# Patient Record
Sex: Male | Born: 1945 | Race: White | Hispanic: No | Marital: Married
Health system: Midwestern US, Community
[De-identification: ages and names within clinical notes are randomized; demographics above are authoritative.]

## PROBLEM LIST (undated history)

## (undated) DIAGNOSIS — K219 Gastro-esophageal reflux disease without esophagitis: Secondary | ICD-10-CM

## (undated) DIAGNOSIS — R3914 Feeling of incomplete bladder emptying: Principal | ICD-10-CM

## (undated) DIAGNOSIS — I10 Essential (primary) hypertension: Principal | ICD-10-CM

## (undated) DIAGNOSIS — R9439 Abnormal result of other cardiovascular function study: Principal | ICD-10-CM

## (undated) DIAGNOSIS — F0670 Mild neurocognitive disorder due to known physiological condition without behavioral disturbance: Principal | ICD-10-CM

## (undated) DIAGNOSIS — E1165 Type 2 diabetes mellitus with hyperglycemia: Principal | ICD-10-CM

## (undated) DIAGNOSIS — M545 Low back pain, unspecified: Principal | ICD-10-CM

## (undated) DIAGNOSIS — E538 Deficiency of other specified B group vitamins: Principal | ICD-10-CM

## (undated) DIAGNOSIS — F320 Major depressive disorder, single episode, mild: Secondary | ICD-10-CM

## (undated) DIAGNOSIS — Z23 Encounter for immunization: Principal | ICD-10-CM

## (undated) DIAGNOSIS — F331 Major depressive disorder, recurrent, moderate: Principal | ICD-10-CM

## (undated) DIAGNOSIS — G8929 Other chronic pain: Secondary | ICD-10-CM

## (undated) DIAGNOSIS — I493 Ventricular premature depolarization: Principal | ICD-10-CM

## (undated) DIAGNOSIS — G4700 Insomnia, unspecified: Principal | ICD-10-CM

## (undated) DIAGNOSIS — R413 Other amnesia: Secondary | ICD-10-CM

## (undated) DIAGNOSIS — D509 Iron deficiency anemia, unspecified: Secondary | ICD-10-CM

## (undated) DIAGNOSIS — M47819 Spondylosis without myelopathy or radiculopathy, site unspecified: Secondary | ICD-10-CM

## (undated) DIAGNOSIS — R0981 Nasal congestion: Secondary | ICD-10-CM

## (undated) DIAGNOSIS — N401 Enlarged prostate with lower urinary tract symptoms: Secondary | ICD-10-CM

## (undated) DIAGNOSIS — R1310 Dysphagia, unspecified: Secondary | ICD-10-CM

## (undated) DIAGNOSIS — M1710 Unilateral primary osteoarthritis, unspecified knee: Secondary | ICD-10-CM

## (undated) DIAGNOSIS — R0602 Shortness of breath: Secondary | ICD-10-CM

## (undated) DIAGNOSIS — F329 Major depressive disorder, single episode, unspecified: Secondary | ICD-10-CM

## (undated) DIAGNOSIS — K449 Diaphragmatic hernia without obstruction or gangrene: Secondary | ICD-10-CM

## (undated) DIAGNOSIS — M722 Plantar fascial fibromatosis: Secondary | ICD-10-CM

## (undated) DIAGNOSIS — M179 Osteoarthritis of knee, unspecified: Secondary | ICD-10-CM

## (undated) DIAGNOSIS — F32A Depression, unspecified: Secondary | ICD-10-CM

## (undated) DIAGNOSIS — R252 Cramp and spasm: Secondary | ICD-10-CM

## (undated) DIAGNOSIS — M1990 Unspecified osteoarthritis, unspecified site: Secondary | ICD-10-CM

## (undated) DIAGNOSIS — F419 Anxiety disorder, unspecified: Secondary | ICD-10-CM

## (undated) HISTORY — DX: Unspecified osteoarthritis, unspecified site: M19.90

## (undated) HISTORY — DX: Shortness of breath: R06.02

## (undated) HISTORY — DX: Diaphragmatic hernia without obstruction or gangrene: K44.9

## (undated) HISTORY — PX: UPPER GI ENDOSCOPY: SHX6162

## (undated) HISTORY — PX: COLONOSCOPY: SHX174

---

## 2006-04-04 HISTORY — PX: SHOULDER SURGERY: SHX246

## 2010-09-03 HISTORY — PX: VENTRAL HERNIA REPAIR: SHX424

## 2010-09-30 DIAGNOSIS — K436 Other and unspecified ventral hernia with obstruction, without gangrene: Secondary | ICD-10-CM

## 2010-10-05 ENCOUNTER — Telehealth (INDEPENDENT_AMBULATORY_CARE_PROVIDER_SITE_OTHER): Payer: Self-pay

## 2010-10-11 ENCOUNTER — Encounter (INDEPENDENT_AMBULATORY_CARE_PROVIDER_SITE_OTHER): Payer: BC Managed Care – PPO | Admitting: Surgery

## 2010-10-12 ENCOUNTER — Encounter (INDEPENDENT_AMBULATORY_CARE_PROVIDER_SITE_OTHER): Payer: BC Managed Care – PPO | Admitting: Surgery

## 2010-10-12 ENCOUNTER — Ambulatory Visit (INDEPENDENT_AMBULATORY_CARE_PROVIDER_SITE_OTHER): Payer: BC Managed Care – PPO | Admitting: Surgery

## 2010-10-12 ENCOUNTER — Encounter (INDEPENDENT_AMBULATORY_CARE_PROVIDER_SITE_OTHER): Payer: Self-pay | Admitting: Surgery

## 2010-10-12 DIAGNOSIS — K439 Ventral hernia without obstruction or gangrene: Secondary | ICD-10-CM

## 2010-10-12 NOTE — Progress Notes (Signed)
HISTORY: Patient is a 65 year old white male who returns for postoperative wound check after undergoing repair of ventral hernia with mesh on September 30, 2010.   PERTINENT REVIEW OF SYSTEMS: Mild discomfort. No drainage.   EXAM: Wound is healing nicely in the midline. Steri-Strips are removed in the office today. No sign of seroma no sign of infection with Valsalva there is no sign of recurrence.   IMPRESSION: Status post repair of ventral hernia with mesh, no evident complications   PLAN: Patient will begin applying topical creams to his incision. He is restricted to 25 pounds lifting. He will return to see me in 6 weeks for final wound check.

## 2010-11-30 ENCOUNTER — Encounter (INDEPENDENT_AMBULATORY_CARE_PROVIDER_SITE_OTHER): Payer: BC Managed Care – PPO | Admitting: Surgery

## 2010-12-13 ENCOUNTER — Encounter (INDEPENDENT_AMBULATORY_CARE_PROVIDER_SITE_OTHER): Payer: Self-pay | Admitting: Surgery

## 2010-12-13 ENCOUNTER — Ambulatory Visit (INDEPENDENT_AMBULATORY_CARE_PROVIDER_SITE_OTHER): Payer: BC Managed Care – PPO | Admitting: Surgery

## 2010-12-13 DIAGNOSIS — K439 Ventral hernia without obstruction or gangrene: Secondary | ICD-10-CM

## 2010-12-13 NOTE — Patient Instructions (Signed)
Released to full activity without restriction.  May cut the grass!  TMG

## 2010-12-13 NOTE — Progress Notes (Signed)
Visit Diagnoses: 1. Ventral hernia without mention of obstruction or gangrene     HISTORY: Patient returns for followup having undergone ventral hernia repair with mesh. He has no complaints.   EXAM: Midline incision is well healed above the level of the umbilicus. With Valsalva, there is no sign of recurrence.   IMPRESSION: Symptomatic ventral hernia, resolved after repair with mesh   PLAN: Patient is released to full activity without restrictions. He will return as needed.   Velora Heckler, MD, FACS General & Endocrine Surgery [facility]

## 2011-09-22 ENCOUNTER — Encounter (INDEPENDENT_AMBULATORY_CARE_PROVIDER_SITE_OTHER): Payer: Self-pay | Admitting: Surgery

## 2011-09-22 ENCOUNTER — Ambulatory Visit (INDEPENDENT_AMBULATORY_CARE_PROVIDER_SITE_OTHER): Payer: BC Managed Care – PPO | Admitting: Surgery

## 2011-09-22 VITALS — BP 130/82 | HR 71 | Temp 97.0°F | Resp 18 | Ht 71.0 in | Wt 215.4 lb

## 2011-09-22 DIAGNOSIS — M6208 Separation of muscle (nontraumatic), other site: Secondary | ICD-10-CM | POA: Insufficient documentation

## 2011-09-22 DIAGNOSIS — M6200 Separation of muscle (nontraumatic), unspecified site: Secondary | ICD-10-CM

## 2011-09-22 NOTE — Patient Instructions (Signed)
Abdominal binder for support  Ice pack, heating pad for comfort

## 2011-09-22 NOTE — Progress Notes (Signed)
General Surgery [facility]  Visit Diagnoses: 1. Rectus diastasis     HISTORY: Patient is a 66 year old white male who underwent repair of ventral hernia with mesh in June 2012. Patient has developed some discomfort, particularly late in the day and in the evening, around the area of the surgical site. He has not seen a bulge. He has had no signs or symptoms of obstruction. The pain is better if he wears a binder or abdominal support. He presents today for evaluation.  PERTINENT REVIEW OF SYSTEMS: No signs or symptoms of intestinal obstruction. No visible bulge.  EXAM: HEENT: normocephalic; pupils equal and reactive; sclerae clear; dentition good; mucous membranes moist NECK:  symmetric on extension; no palpable anterior or posterior cervical lymphadenopathy; no supraclavicular masses; no tenderness ABDOMEN: Soft without distention; surgical wound is well healed; with sit-up maneuver there is a moderate rectus diastases in the upper midline; no palpable fascial defect. Slight laxity in the abdominal wall just above the level of the umbilicusI EXT:  non-tender without edema; no deformity NEURO: no gross focal deficits; no sign of tremor   IMPRESSION: #1 moderate rectus diastasis #2 history of ventral hernia repair with mesh, no evidence of recurrence  PLAN: The patient and his wife and I discussed the above findings. At this point there is no role for surgical intervention. I have asked him not to restrict his activities. There is approximately a 5-10% chance of recurrent hernia. If this were to occur, I believe he would be a good candidate for laparoscopic repair.  Patient will return in 6 months for evaluation. In the interim he will use an abdominal binder or support with physical activity. He may use nonsteroidal anti-inflammatory medications for symptomatic relief.  Velora Heckler, MD, FACS General & Endocrine Surgery [facility]

## 2012-09-27 ENCOUNTER — Ambulatory Visit: Payer: Self-pay | Admitting: *Deleted

## 2012-12-31 ENCOUNTER — Telehealth (INDEPENDENT_AMBULATORY_CARE_PROVIDER_SITE_OTHER): Payer: Self-pay

## 2012-12-31 NOTE — Telephone Encounter (Signed)
Pt called stating he has notice soreness at times above his hernia repair. No bulge that does not reduce. No redness or fever. Pt offered ov. Pt declined and states he will see if symptoms resolves and if not he will call back for appt.

## 2013-04-04 HISTORY — PX: TOTAL KNEE ARTHROPLASTY: SHX125

## 2014-08-05 ENCOUNTER — Other Ambulatory Visit (HOSPITAL_COMMUNITY): Payer: Self-pay | Admitting: Orthopedic Surgery

## 2014-08-05 DIAGNOSIS — M25561 Pain in right knee: Secondary | ICD-10-CM

## 2014-08-14 ENCOUNTER — Encounter (HOSPITAL_COMMUNITY): Payer: Medicare Other

## 2014-08-14 ENCOUNTER — Encounter (HOSPITAL_COMMUNITY)
Admission: RE | Admit: 2014-08-14 | Discharge: 2014-08-14 | Disposition: A | Discharge status: Home / Self Care | Payer: Medicare Other | Source: Ambulatory Visit | Attending: Orthopedic Surgery | Admitting: Orthopedic Surgery

## 2014-08-14 DIAGNOSIS — R937 Abnormal findings on diagnostic imaging of other parts of musculoskeletal system: Secondary | ICD-10-CM | POA: Diagnosis not present

## 2014-08-14 DIAGNOSIS — Z96651 Presence of right artificial knee joint: Secondary | ICD-10-CM | POA: Diagnosis not present

## 2014-08-14 DIAGNOSIS — M25561 Pain in right knee: Secondary | ICD-10-CM | POA: Diagnosis not present

## 2014-08-14 MED ORDER — TECHNETIUM TC 99M MEDRONATE IV KIT
25.0000 | PACK | Freq: Once | INTRAVENOUS | Status: AC | PRN
Start: 2014-08-14 — End: 2014-08-14
  Administered 2014-08-14: 25 via INTRAVENOUS

## 2015-01-26 ENCOUNTER — Encounter: Payer: Self-pay | Admitting: Podiatry

## 2015-01-26 ENCOUNTER — Ambulatory Visit (INDEPENDENT_AMBULATORY_CARE_PROVIDER_SITE_OTHER): Payer: Medicare Other | Admitting: Podiatry

## 2015-01-26 VITALS — BP 159/81 | HR 76 | Ht 71.0 in | Wt 230.0 lb

## 2015-01-26 DIAGNOSIS — M216X9 Other acquired deformities of unspecified foot: Secondary | ICD-10-CM | POA: Insufficient documentation

## 2015-01-26 DIAGNOSIS — M722 Plantar fascial fibromatosis: Secondary | ICD-10-CM | POA: Diagnosis not present

## 2015-01-26 DIAGNOSIS — M216X1 Other acquired deformities of right foot: Secondary | ICD-10-CM

## 2015-01-26 NOTE — Progress Notes (Signed)
SUBJECTIVE: 69 y.o. year old male presents for pain in right heel x 2 months.  Seen other doctor and got 2 injections. Pain was in mid strip plantar fascia and now it is on the center heel. Pain is mostly on the plantar heel after been on for 10 hours on feet.  Pain also comes on around ankle. He has Night Splint but has not been using it.   REVIEW OF SYSTEMS: A comprehensive review of systems was negative except for: right knee surgery 2 years ago.   OBJECTIVE: DERMATOLOGIC EXAMINATION: No abnormal findings.  VASCULAR EXAMINATION OF LOWER LIMBS: Pedal pulses: All pedal pulses are palpable with normal pulsation.  NEUROLOGIC EXAMINATION OF THE LOWER LIMBS: All epicritic and tactile sensations grossly intact.  MUSCULOSKELETAL EXAMINATION: Positive for tight Achilles tendon right. Cavus type foot bilateral. Excess sagittal plane motion of the first ray left.  Excess transverse plane motion of forefoot bilateral.  RADIOGRAPHIC STUDIES:  Findings reveal high arched calcaneus bilateral, supinated Subtalar joints bilateral, elevated first ray left without acute changes in forefoot or rearfoot bilateral.   ASSESSMENT: Ankle Equinus right. Plantar fasciitis right.  Compensated rearfoot varus bilateral.  PLAN: Reviewed clinical findings and available treatment options; NSAIA, Injection, Orthotics, Exercise, Night Splint. Reviewed Achilles tendon stretch exercise, benefit of using Night Splint, NSAIA, Cortisone injection, and Orthotics.  Patient will return for orthotics.

## 2015-01-26 NOTE — Patient Instructions (Signed)
Seen for right heel pain. Noted of tight Achilles tendon right. Reviewed stretch exercise. Need orthotics. We will contact with insurance benefit info.

## 2016-06-03 IMAGING — NM NM BONE 3 PHASE
10 series · 20 of 20 positions shown · non-contrast
Comparison: None.

CLINICAL DATA: Evaluate for loosening or infection of right knee
prosthesis

EXAM:
NUCLEAR MEDICINE 3-PHASE BONE SCAN
TECHNIQUE: Radionuclide angiographic images, immediate static blood pool
images, and 3-hour delayed static images were obtained of the knees
after intravenous injection of radiopharmaceutical.
RADIOPHARMACEUTICALS:  25.0 Mechnetium-NNm MDP IV

[Series 1: flow · 2.07mm/px · 6 of 48 frames shown (1 of 2)]
[frame 5/48  full-range]
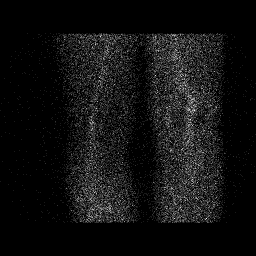
[frame 13/48  full-range]
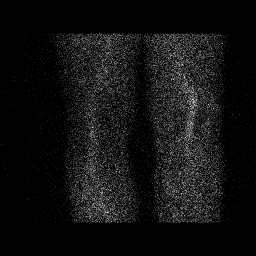
[frame 21/48  full-range]
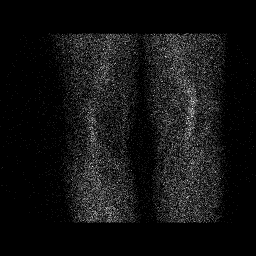
[frame 29/48  full-range]
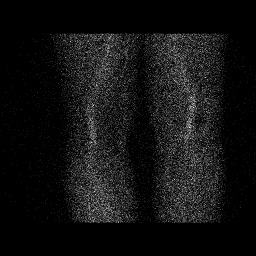
[frame 37/48  full-range]
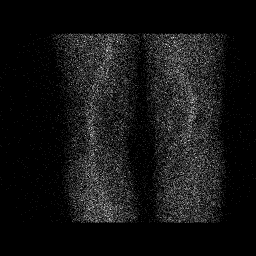
[frame 45/48  full-range]
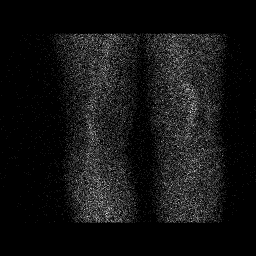

[Series 1: flow · 2.07mm/px · 6 of 48 frames shown (2 of 2)]
[frame 5/48  full-range]
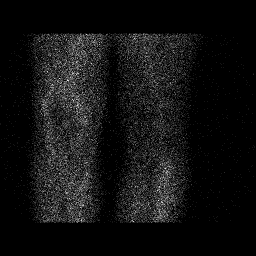
[frame 13/48  full-range]
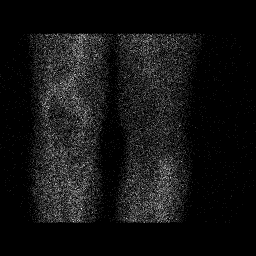
[frame 21/48  full-range]
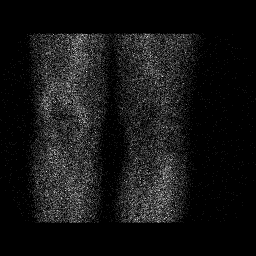
[frame 29/48  full-range]
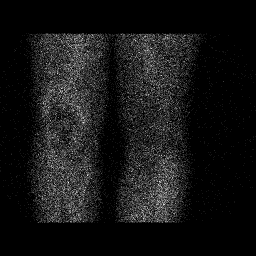
[frame 37/48  full-range]
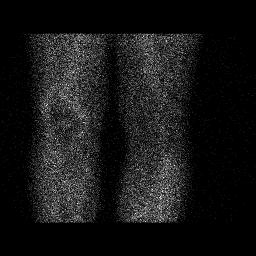
[frame 45/48  full-range]
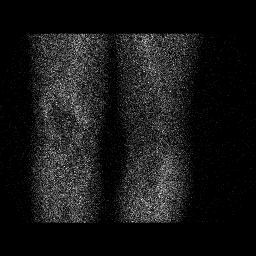

[Series 2: wbr_bone_60 blood pool · 2.07mm/px · 1 of 1 slices shown (1 of 6)]
[im 1/1  full-range]
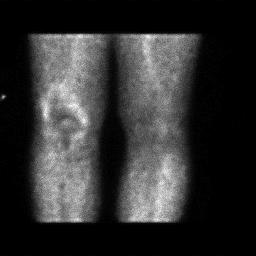

[Series 2: wbr_bone_60 blood pool · 2.07mm/px · 1 of 1 slices shown (2 of 6)]
[im 1/1  full-range]
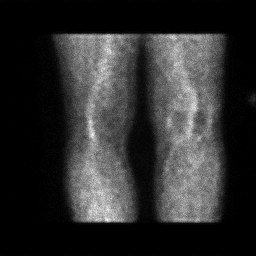

[Series 3: wbr_bone_60 lat bp · 2.07mm/px · 1 of 1 slices shown (1 of 2)]
[im 1/1  full-range]
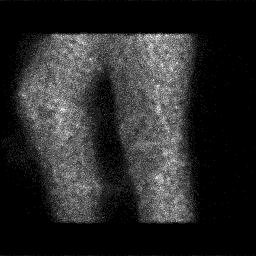

[Series 3: wbr_bone_60 lat bp · 2.07mm/px · 1 of 1 slices shown (2 of 2)]
[im 1/1  full-range]
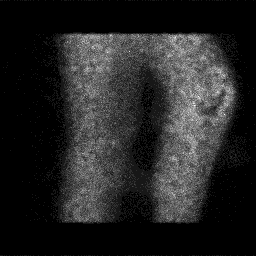

[Series 5: wbr_bone_60 blood pool · 2.07mm/px · 1 of 1 slices shown (3 of 6)]
[im 1/1  full-range]
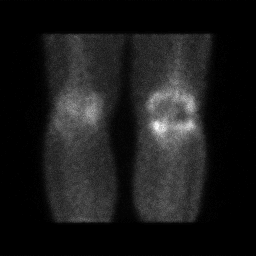

[Series 5: wbr_bone_60 blood pool · 2.07mm/px · 1 of 1 slices shown (4 of 6)]
[im 1/1]
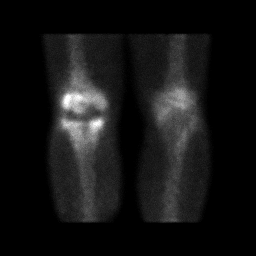

[Series 6: wbr_bone_60 blood pool · 2.07mm/px · 1 of 1 slices shown (5 of 6)]
[im 1/1]
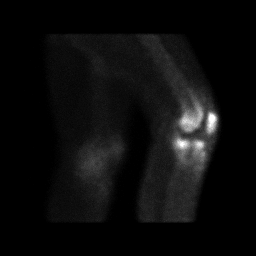

[Series 6: wbr_bone_60 blood pool · 2.07mm/px · 1 of 1 slices shown (6 of 6)]
[im 1/1]
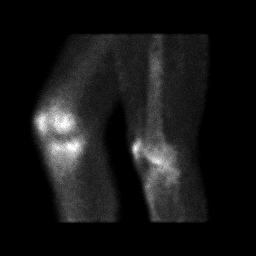

[20 of 20 positions shown; findings below may reference images not displayed]

FINDINGS: Vascular phase: On the flow phase portion of the examination there
is a mild asymmetric increased blood flow surrounding the right knee
arthroplasty device.

Blood pool phase: Asymmetric increased blood pool activity surrounds
the right knee arthroplasty device.

Delayed phase: There is moderate asymmetric increased uptake
surrounding the femoral and tibial components of the right knee
arthroplasty device.
IMPRESSION: Findings are compatible with either arthroplasty device loosening or
infection.

## 2016-11-30 ENCOUNTER — Other Ambulatory Visit: Payer: Medicare Other

## 2017-02-09 ENCOUNTER — Other Ambulatory Visit: Payer: Self-pay

## 2017-02-09 ENCOUNTER — Encounter (HOSPITAL_COMMUNITY): Payer: Self-pay

## 2017-02-09 ENCOUNTER — Emergency Department (HOSPITAL_COMMUNITY)
Admission: EM | Admit: 2017-02-09 | Discharge: 2017-02-09 | Disposition: A | Discharge status: Home / Self Care | Payer: Medicare Other | Attending: Emergency Medicine | Admitting: Emergency Medicine

## 2017-02-09 DIAGNOSIS — S6992XA Unspecified injury of left wrist, hand and finger(s), initial encounter: Secondary | ICD-10-CM | POA: Diagnosis present

## 2017-02-09 DIAGNOSIS — Z23 Encounter for immunization: Secondary | ICD-10-CM | POA: Diagnosis not present

## 2017-02-09 DIAGNOSIS — Z87891 Personal history of nicotine dependence: Secondary | ICD-10-CM | POA: Diagnosis not present

## 2017-02-09 DIAGNOSIS — Z79899 Other long term (current) drug therapy: Secondary | ICD-10-CM | POA: Diagnosis not present

## 2017-02-09 DIAGNOSIS — Y929 Unspecified place or not applicable: Secondary | ICD-10-CM | POA: Diagnosis not present

## 2017-02-09 DIAGNOSIS — Z7982 Long term (current) use of aspirin: Secondary | ICD-10-CM | POA: Diagnosis not present

## 2017-02-09 DIAGNOSIS — Y999 Unspecified external cause status: Secondary | ICD-10-CM | POA: Insufficient documentation

## 2017-02-09 DIAGNOSIS — Y9389 Activity, other specified: Secondary | ICD-10-CM | POA: Diagnosis not present

## 2017-02-09 DIAGNOSIS — S61412A Laceration without foreign body of left hand, initial encounter: Secondary | ICD-10-CM | POA: Diagnosis not present

## 2017-02-09 DIAGNOSIS — W298XXA Contact with other powered powered hand tools and household machinery, initial encounter: Secondary | ICD-10-CM | POA: Insufficient documentation

## 2017-02-09 MED ORDER — LIDOCAINE HCL 2 % IJ SOLN
20.0000 mL | Freq: Once | INTRAMUSCULAR | Status: AC
  Administered 2017-02-09: 400 mg
  Filled 2017-02-09: qty 20

## 2017-02-09 MED ORDER — CEPHALEXIN 500 MG PO CAPS: 500.0000 mg | ORAL_CAPSULE | Freq: Two times a day (BID) | ORAL | 0 refills | Status: DC

## 2017-02-09 MED ORDER — CEPHALEXIN 250 MG PO CAPS
500.0000 mg | ORAL_CAPSULE | Freq: Once | ORAL | Status: AC
  Administered 2017-02-09: 500 mg via ORAL
  Filled 2017-02-09: qty 2

## 2017-02-09 MED ORDER — TETANUS-DIPHTH-ACELL PERTUSSIS 5-2.5-18.5 LF-MCG/0.5 IM SUSP
0.5000 mL | Freq: Once | INTRAMUSCULAR | Status: AC
  Administered 2017-02-09: 0.5 mL via INTRAMUSCULAR
  Filled 2017-02-09: qty 0.5

## 2017-02-09 NOTE — ED Notes (Signed)
States a piece of metal got caught on his drill and it cut his left  hand  palm of hand at the base of his thumb.

## 2017-02-09 NOTE — ED Provider Notes (Signed)
[facility] Provider Note   CSN: [id number] Arrival date & time: 02/09/17  1329     History   Chief Complaint No chief complaint on file.   HPI Tommy Bailey is a 71 y.o. male.  HPI    71 year old male presents today with a laceration to his left hand.  Patient reports that he was using a saw when he caused a laceration to the palmar aspect of the hand.  Bleeding controlled with direct pressure, tetanus status unknown.  Patient denies any loss of sensation strength and motor function of the fingers.  She takes 81 mg of aspirin daily, no anticoagulation.  Patient is right-hand dominant.    Past Medical History:  Diagnosis Date  . Arthritis   . Hiatal hernia   . Muscle ache   . SOB (shortness of breath)     Patient Active Problem List   Diagnosis Date Noted  . Plantar fasciitis of right foot 01/26/2015  . Equinus deformity of foot, acquired 01/26/2015  . Rectus diastasis 09/22/2011    Past Surgical History:  Procedure Laterality Date  . SHOULDER SURGERY  2008   both       Home Medications    Prior to Admission medications   Medication Sig Start Date End Date Taking? Authorizing Provider  aspirin 81 MG tablet Take 81 mg by mouth daily.      [provider]  cephALEXin (KEFLEX) 500 MG capsule Take 1 capsule (500 mg total) 2 (two) times daily by mouth. 02/09/17   Enes Rokosz, Tinnie GensJeffrey, PA-C  fish oil-omega-3 fatty acids 1000 MG capsule Take 2 g by mouth daily.      [provider]  GLUCOSAMINE-VITAMIN D PO Take 2,000 mg by mouth.      [provider]  Multiple Vitamin (MULTIVITAMIN) tablet Take 1 tablet by mouth daily.      [provider]  omeprazole (PRILOSEC) 40 MG capsule Take 20 mg by mouth daily.     [provider]    Family History Family History  Problem Relation Age of Onset  . Cancer Mother        leukemia  . Heart disease Father     Social History Social History    Tobacco Use  . Smoking status: Former Smoker    Last attempt to quit: 01/03/2007    Years since quitting: 10.1  . Smokeless tobacco: Never Used  Substance Use Topics  . Alcohol use: No    Alcohol/week: 0.0 oz    Comment: occasional  . Drug use: No     Allergies   Anesthetics, amide and Tramadol   Review of Systems Review of Systems  All other systems reviewed and are negative.  Physical Exam Updated Vital Signs BP (!) 138/99 (BP Location: Right Arm)   Pulse 79   Temp 98.4 F (36.9 C) (Oral)   Resp 16   SpO2 96%   Physical Exam  Constitutional: He is oriented to person, place, and time. He appears well-developed and well-nourished.  HENT:  Head: Normocephalic and atraumatic.  Eyes: Conjunctivae are normal. Pupils are equal, round, and reactive to light. Right eye exhibits no discharge. Left eye exhibits no discharge. No scleral icterus.  Neck: Normal range of motion. No JVD present. No tracheal deviation present.  Pulmonary/Chest: Effort normal. No stridor.  Musculoskeletal:  5 cm laceration to the palmar aspect of the left hand in between the second and first digit, no deep space involvement, no tendon,  muscle, major vessel or nerve involved full active range of motion of the finger and hand sensation intact  Neurological: He is alert and oriented to person, place, and time. Coordination normal.  Psychiatric: He has a normal mood and affect. His behavior is normal. Judgment and thought content normal.  Nursing note and vitals reviewed.    ED Treatments / Results  Labs (all labs ordered are listed, but only abnormal results are displayed) Labs Reviewed - No data to display  EKG  EKG Interpretation None       Radiology No results found.  Procedures Procedures (including critical care time)  Medications Ordered in ED Medications  lidocaine (XYLOCAINE) 2 % (with pres) injection 400 mg (400 mg Infiltration Given 02/09/17 1608)  Tdap (BOOSTRIX) injection  0.5 mL (0.5 mLs Intramuscular Given 02/09/17 1608)  cephALEXin (KEFLEX) capsule 500 mg (500 mg Oral Given 02/09/17 1838)     Initial Impression / Assessment and Plan / ED Course  I have reviewed the triage vital signs and the nursing notes.  Pertinent labs & imaging results that were available during my care of the patient were reviewed by me and considered in my medical decision making (see chart for details).      Final Clinical Impressions(s) / ED Diagnoses   Final diagnoses:  Laceration of left hand without foreign body, initial encounter    Labs:   Imaging:  Consults:  Therapeutics: Lidocaine, Tdap, Keflex  Discharge Meds: keflex  Assessment/Plan: 71 year old male presents today with laceration to his left hand.   Dr. Ottie GlazierGunadasa and I repaired the laceration after copious irrigation and evaluation.  Patient tolerated the procedure well without complication.  Patient's tetanus was updated, he will be started on prophylactic antibiotic for the next 3 days, he will return immediately with any new or worsening signs or symptoms.     ED Discharge Orders        Ordered    cephALEXin (KEFLEX) 500 MG capsule  2 times daily     02/09/17 1826       Eyvonne MechanicHedges, Jaycee Pelzer, PA-C 02/09/17 2045    Pricilla LovelessGoldston, Scott, MD 02/11/17 1946

## 2017-02-09 NOTE — ED Triage Notes (Addendum)
Patient here with laceration to left hand, cut with knife, bleeding controlled. Full ROM, dressing with saline

## 2017-02-09 NOTE — Discharge Instructions (Signed)
Please read attached information. If you experience any new or worsening signs or symptoms please return to the emergency room for evaluation. Please follow-up with your primary care provider or specialist as discussed. Please use medication prescribed only as directed and discontinue taking if you have any concerning signs or symptoms.   °

## 2017-02-09 NOTE — ED Notes (Signed)
Bleeding controled

## 2017-02-09 NOTE — ED Notes (Signed)
Pt verbalized understanding discharge instructions and denies any further needs or questions at this time. VS stable, ambulatory and steady gait.   

## 2017-02-09 NOTE — ED Provider Notes (Signed)
  [facility]  LACERATION REPAIR PROCEDURE NOTE The patient's identification was confirmed and consent was obtained. This procedure was performed by Kandis MannanKanishka Gunadasa at 5:44 PM. Site: Palmar aspect of the left hand  Anesthetic used (type and amt): Lidocaine 2% 7cc Suture type/size: 4-0 Vicryl Rapide  Length: 5cm  # of Sutures: 5 simple interrupted, 2 subcuticular  Antibx ointment applied yes Tetanus UTD or orderedyes Site anesthetized, irrigated with NS, explored without evidence of foreign body, wound well approximated, site covered with dry, sterile dressing.  Patient tolerated procedure well without complications. Instructions for care discussed verbally and patient provided with additional written instructions for homecare and f/u.  Palma HolterKanishka G Gunadasa, MD PGY3 Family Medicine     Palma HolterGunadasa, Kanishka G, MD 02/09/17 [phone]

## 2017-04-26 ENCOUNTER — Encounter (INDEPENDENT_AMBULATORY_CARE_PROVIDER_SITE_OTHER): Payer: Self-pay | Admitting: Orthopaedic Surgery

## 2017-04-26 ENCOUNTER — Ambulatory Visit (INDEPENDENT_AMBULATORY_CARE_PROVIDER_SITE_OTHER): Payer: Medicare Other

## 2017-04-26 ENCOUNTER — Ambulatory Visit (INDEPENDENT_AMBULATORY_CARE_PROVIDER_SITE_OTHER): Payer: Medicare Other | Admitting: Orthopaedic Surgery

## 2017-04-26 ENCOUNTER — Other Ambulatory Visit (INDEPENDENT_AMBULATORY_CARE_PROVIDER_SITE_OTHER): Payer: Self-pay

## 2017-04-26 DIAGNOSIS — G8929 Other chronic pain: Secondary | ICD-10-CM | POA: Diagnosis not present

## 2017-04-26 DIAGNOSIS — Z96651 Presence of right artificial knee joint: Secondary | ICD-10-CM

## 2017-04-26 DIAGNOSIS — M25561 Pain in right knee: Secondary | ICD-10-CM

## 2017-04-26 NOTE — Progress Notes (Signed)
Office Visit Note   Patient: Tommy Bailey           Date of Birth: Dec 13, 1945           MRN: [id number] Visit Date: 04/26/2017              Requested by: Tommy GrippeKim, James, MD [address] [place], [place] [id number] PCP: Tommy GrippeKim, James, MD   Assessment & Plan: Visit Diagnoses:  1. Chronic pain of right knee   2. Presence of right artificial knee joint     Plan: We will obtain a bone scan to rule out loosening/infection of the right total knee.  See him back 2 weeks after the bone scan to go over results.  Aspirate today from the right knee which was a total of 32 cc was sent for cell count.  Questions encouraged and answered at length by Dr. Magnus IvanBlackman and myself.  Follow-Up Instructions: Return for 2 weeks.   Orders:  Orders Placed This Encounter  Procedures  . XR KNEE 3 VIEW RIGHT  . Cell count + diff,  w/ cryst-synvl fld   No orders of the defined types were placed in this encounter.     Procedures: No procedures performed   Clinical Data: No additional findings.   Subjective: Chief Complaint  Patient presents with  . Right Knee - Pain    HPI Tommy Bailey is a 72 year old male was seen for the first time for right knee pain.  He underwent a right total knee arthroplasty by physician at [place] in [place].  He states that he had pain in the knee since the surgery.  He did have to undergo manipulation of the knee.  He states he did well with his range of motion but it was still aching and tight.  Now his range of motion is decreasing.  States if he stands for prolonged period of time knee gets tighter and tighter because of the trip.  Is achy pain in the knee even with sitting.  His pain with change in positions of from sitting to standing.  Stairs are very difficult for him.  States that there is "an elastic bandage around the knee "feeling with the use.  He notes that he has had a area of tenderness medial aspect of the knee that is been there since "day 1".  Pain  does awaken him at night and is mostly along the lateral aspect of the knee.  Does feel that his scars after surgeries in the past and wonders if the scar tissue is what is causing his pain in his knee.  Is seen by orthopedic surgeon here in town in 2016 and had bone scan which showed findings compatible with loosening or infection of the knee then [facility] he was told he did not have any type of loosening possible infection and he states he was given antibiotics is unsure if this really helped.  He denies any recent fevers chills shortness of breath chest pain.  He did have an upper respiratory infection approximately 2 months ago. Review of Systems  Denies any mechanical symptoms of the knee.  Denies any redness or abnormal warmth right knee.  No chest pain shortness breath fevers chills. Objective: Vital Signs: There were no vitals taken for this visit.  Physical Exam  Constitutional: He is oriented to person, place, and time. He appears well-developed and well-nourished. No distress.  Pulmonary/Chest: Effort normal.  Neurological: He is alert  and oriented to person, place, and time.  Skin: He is not diaphoretic.  Psychiatric: He has a normal mood and affect.    Ortho Exam Right knee full extension flexion and 90 degrees.  No instability valgus varus stressing.  Anterior drawer posterior drawer negative.  No abnormal warmth erythema when compared to the left knee.  Slight edema and positive effusion right knee.  Surgical incisions well healed.  Specialty Comments:  No specialty comments available.  Imaging: Xr Knee 3 View Right  Result Date: 04/26/2017 3 views right knee: Well seated right total knee arthroplasty.  No acute fractures.  Knee is well located.  No bony abnormalities.    PMFS History: Patient Active Problem List   Diagnosis Date Noted  . Plantar fasciitis of right foot 01/26/2015  . Equinus deformity of foot, acquired 01/26/2015  . Rectus  diastasis 09/22/2011   Past Medical History:  Diagnosis Date  . Arthritis   . Hiatal hernia   . Muscle ache   . SOB (shortness of breath)     Family History  Problem Relation Age of Onset  . Cancer Mother        leukemia  . Heart disease Father     Past Surgical History:  Procedure Laterality Date  . SHOULDER SURGERY  2008   both   Social History   Occupational History  . Not on file  Tobacco Use  . Smoking status: Former Smoker    Last attempt to quit: 01/03/2007    Years since quitting: 10.3  . Smokeless tobacco: Never Used  Substance and Sexual Activity  . Alcohol use: No    Alcohol/week: 0.0 oz    Comment: occasional  . Drug use: No  . Sexual activity: Not on file

## 2017-04-26 NOTE — Progress Notes (Signed)
3 phase

## 2017-04-27 LAB — SYNOVIAL CELL COUNT + DIFF, W/ CRYSTALS
BASOPHILS, %: 0 %
Eosinophils-Synovial: 2 % (ref 0–2)
Lymphocytes-Synovial Fld: 57 % (ref 0–74)
Monocyte/Macrophage: 31 % (ref 0–69)
Neutrophil, Synovial: 5 % (ref 0–24)
SYNOVIOCYTES, %: 5 % (ref 0–15)
WBC, Synovial: 205 cells/uL — ABNORMAL HIGH (ref <150)

## 2017-04-27 LAB — TIQ-NTM

## 2017-04-28 ENCOUNTER — Telehealth (INDEPENDENT_AMBULATORY_CARE_PROVIDER_SITE_OTHER): Payer: Self-pay | Admitting: Orthopaedic Surgery

## 2017-04-28 NOTE — Telephone Encounter (Signed)
IC patient and LMVM advising that we did receive [facility], however his OP note was not included. Told the patient that the cover letter of the fax stated he needed to contact the facility where he had the surgery for the OP note and asked patient to do that as Dr. Magnus IvanBlackman is still in need of that.

## 2017-05-03 ENCOUNTER — Other Ambulatory Visit (INDEPENDENT_AMBULATORY_CARE_PROVIDER_SITE_OTHER): Payer: Self-pay | Admitting: Orthopaedic Surgery

## 2017-05-03 ENCOUNTER — Telehealth (INDEPENDENT_AMBULATORY_CARE_PROVIDER_SITE_OTHER): Payer: Self-pay | Admitting: *Deleted

## 2017-05-03 NOTE — Telephone Encounter (Signed)
Pt has appt scheduled for Mon Jan 4 at 11 am for injection and then back at 2 pm for the scan. I called and left message on vm to return call for appt information.

## 2017-05-05 NOTE — Telephone Encounter (Signed)
Pt aware fo appt

## 2017-05-08 ENCOUNTER — Encounter (HOSPITAL_COMMUNITY)
Admission: RE | Admit: 2017-05-08 | Discharge: 2017-05-08 | Disposition: A | Discharge status: Home / Self Care | Payer: Medicare Other | Source: Ambulatory Visit | Attending: Orthopaedic Surgery | Admitting: Orthopaedic Surgery

## 2017-05-08 DIAGNOSIS — Z96651 Presence of right artificial knee joint: Secondary | ICD-10-CM | POA: Insufficient documentation

## 2017-05-08 MED ORDER — TECHNETIUM TC 99M MEDRONATE IV KIT
20.2000 | PACK | Freq: Once | INTRAVENOUS | Status: AC | PRN
  Administered 2017-05-08: 20.2 via INTRAVENOUS

## 2017-05-10 ENCOUNTER — Encounter (INDEPENDENT_AMBULATORY_CARE_PROVIDER_SITE_OTHER): Payer: Self-pay | Admitting: Orthopaedic Surgery

## 2017-05-10 ENCOUNTER — Ambulatory Visit (INDEPENDENT_AMBULATORY_CARE_PROVIDER_SITE_OTHER): Payer: Medicare Other | Admitting: Orthopaedic Surgery

## 2017-05-10 DIAGNOSIS — M25561 Pain in right knee: Secondary | ICD-10-CM | POA: Diagnosis not present

## 2017-05-10 DIAGNOSIS — Z96651 Presence of right artificial knee joint: Secondary | ICD-10-CM

## 2017-05-10 DIAGNOSIS — G8929 Other chronic pain: Secondary | ICD-10-CM | POA: Diagnosis not present

## 2017-05-10 NOTE — Progress Notes (Signed)
Patient is a very pleasant 72 year old gentleman I am seeing in follow-up after having a three-phase bone scan of his right knee to rule out prosthetic loosening.  He is someone who had a total knee arthroplasty done elsewhere in 2015.  He developed significant scar tissue postoperatively.  He needed a manipulation under anesthesia.  He seemed another physician as well for second opinion.  At this point his flexion is only to about 70 degrees or so on that knee and it hurts on a daily basis which is more mechanical or not.  The knee also swells as well.  Again this is the right knee.  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  They cannot rule out infection but I told him that the low sensitivity for infection.  We did aspirate his knee and only found just over 200 white blood cells.  I feel this is more indicative of prosthetic loosening and the buildup of scar tissue.  Had a long and thorough discussion with him about revision surgery.  He has been taught his wife and think about this.  I gave him our surgery schedulers card.  This would involve first opening the knee up and removing as much scar tissue on the polyethylene liner and then assessing the components for loosening and revising as appropriate.  We had a long and thorough discussion of the risk minutes of this type of surgery and what his intraoperative and postoperative course involved.  I went over his x-rays again as well as his plain films.  All questions concerns were answered and addressed.  He will let us know.

## 2017-05-12 ENCOUNTER — Telehealth (INDEPENDENT_AMBULATORY_CARE_PROVIDER_SITE_OTHER): Payer: Self-pay

## 2017-05-12 NOTE — Telephone Encounter (Signed)
Patient wants to schedule surgery.  Please write surgery sheet for me.  Thanks!

## 2017-05-15 ENCOUNTER — Encounter (INDEPENDENT_AMBULATORY_CARE_PROVIDER_SITE_OTHER): Payer: Self-pay | Admitting: Orthopaedic Surgery

## 2017-05-15 DIAGNOSIS — Z96659 Presence of unspecified artificial knee joint: Secondary | ICD-10-CM

## 2017-05-15 DIAGNOSIS — T84038A Mechanical loosening of other internal prosthetic joint, initial encounter: Secondary | ICD-10-CM | POA: Insufficient documentation

## 2017-05-17 ENCOUNTER — Telehealth (INDEPENDENT_AMBULATORY_CARE_PROVIDER_SITE_OTHER): Payer: Self-pay | Admitting: Orthopaedic Surgery

## 2017-05-17 NOTE — Telephone Encounter (Signed)
I called patient and scheduled surgery. 

## 2017-05-17 NOTE — Telephone Encounter (Signed)
Patient stated that he has left several message and just wanted to make sure that you were receiving them.  CB#[phone].  Thank you.

## 2017-06-02 NOTE — Progress Notes (Signed)
Please place orders in Epic as patient is being scheduled for a pre-op appointment! Thank you! 

## 2017-06-05 NOTE — Progress Notes (Signed)
Please place orders in Epic as patient has a pre-op appointment on 06/08/2017 at 0800 am ! Thank you!

## 2017-06-06 ENCOUNTER — Encounter (HOSPITAL_COMMUNITY): Payer: Self-pay

## 2017-06-07 ENCOUNTER — Other Ambulatory Visit (INDEPENDENT_AMBULATORY_CARE_PROVIDER_SITE_OTHER): Payer: Self-pay | Admitting: Physician Assistant

## 2017-06-07 NOTE — Patient Instructions (Signed)
Your procedure is scheduled on: Friday, June 16, 2017   Surgery Time:  12:00N-2:30PM   Report to [facility] Main  Entrance    Report to admitting at 9:30 AM   Call this number if you have problems the morning of surgery [phone]   Do not eat food or drink liquids :After Midnight.   Do NOT smoke after Midnight   Take these medicines the morning of surgery with A SIP OF WATER: Omeprazole, Xanax if needed                               You may not have any metal on your body including jewelry, and body piercings             Do not wear lotions, powders, perfumes/cologne, or deodorant                          Men may shave face and neck.   Do not bring valuables to the hospital. [employer] IS NOT             RESPONSIBLE   FOR VALUABLES.   Contacts, dentures or bridgework may not be worn into surgery.   Leave suitcase in the car. After surgery it may be brought to your room.   Special Instructions: Bring a copy of your healthcare power of attorney and living will documents         the day of surgery if you haven't scanned them in before.              Please read over the following fact sheets you were given:  [facility] - Preparing for Surgery Before surgery, you can play an important role.  Because skin is not sterile, your skin needs to be as free of germs as possible.  You can reduce the number of germs on your skin by washing with CHG (chlorahexidine gluconate) soap before surgery.  CHG is an antiseptic cleaner which kills germs and bonds with the skin to continue killing germs even after washing. Please DO NOT use if you have an allergy to CHG or antibacterial soaps.  If your skin becomes reddened/irritated stop using the CHG and inform your nurse when you arrive at Short Stay. Do not shave (including legs and underarms) for at least 48 hours prior to the first CHG shower.  You may shave your face/neck.  Please follow these instructions carefully:  1.   Shower with CHG Soap the night before surgery and the  morning of surgery.  2.  If you choose to wash your hair, wash your hair first as usual with your normal  shampoo.  3.  After you shampoo, rinse your hair and body thoroughly to remove the shampoo.                             4.  Use CHG as you would any other liquid soap.  You can apply chg directly to the skin and wash.  Gently with a scrungie or clean washcloth.  5.  Apply the CHG Soap to your body ONLY FROM THE NECK DOWN.   Do   not use on face/ open                           Wound  or open sores. Avoid contact with eyes, ears mouth and   genitals (private parts).                       Wash face,  Genitals (private parts) with your normal soap.             6.  Wash thoroughly, paying special attention to the area where your    surgery  will be performed.  7.  Thoroughly rinse your body with warm water from the neck down.  8.  DO NOT shower/wash with your normal soap after using and rinsing off the CHG Soap.                9.  Pat yourself dry with a clean towel.            10.  Wear clean pajamas.            11.  Place clean sheets on your bed the night of your first shower and do not  sleep with pets. Day of Surgery : Do not apply any lotions/deodorants the morning of surgery.  Please wear clean clothes to the hospital/surgery center.  FAILURE TO FOLLOW THESE INSTRUCTIONS MAY RESULT IN THE CANCELLATION OF YOUR SURGERY  PATIENT SIGNATURE_________________________________  NURSE SIGNATURE__________________________________  ________________________________________________________________________   Rogelia MireIncentive Spirometer  An incentive spirometer is a tool that can help keep your lungs clear and active. This tool measures how well you are filling your lungs with each breath. Taking long deep breaths may help reverse or decrease the chance of developing breathing (pulmonary) problems (especially infection) following:  A long period of  time when you are unable to move or be active. BEFORE THE PROCEDURE   If the spirometer includes an indicator to show your best effort, your nurse or respiratory therapist will set it to a desired goal.  If possible, sit up straight or lean slightly forward. Try not to slouch.  Hold the incentive spirometer in an upright position. INSTRUCTIONS FOR USE  1. Sit on the edge of your bed if possible, or sit up as far as you can in bed or on a chair. 2. Hold the incentive spirometer in an upright position. 3. Breathe out normally. 4. Place the mouthpiece in your mouth and seal your lips tightly around it. 5. Breathe in slowly and as deeply as possible, raising the piston or the ball toward the top of the column. 6. Hold your breath for 3-5 seconds or for as long as possible. Allow the piston or ball to fall to the bottom of the column. 7. Remove the mouthpiece from your mouth and breathe out normally. 8. Rest for a few seconds and repeat Steps 1 through 7 at least 10 times every 1-2 hours when you are awake. Take your time and take a few normal breaths between deep breaths. 9. The spirometer may include an indicator to show your best effort. Use the indicator as a goal to work toward during each repetition. 10. After each set of 10 deep breaths, practice coughing to be sure your lungs are clear. If you have an incision (the cut made at the time of surgery), support your incision when coughing by placing a pillow or rolled up towels firmly against it. Once you are able to get out of bed, walk around indoors and cough well. You may stop using the incentive spirometer when instructed by your caregiver.  RISKS AND COMPLICATIONS  Take your time so  you do not get dizzy or light-headed.  If you are in pain, you may need to take or ask for pain medication before doing incentive spirometry. It is harder to take a deep breath if you are having pain. AFTER USE  Rest and breathe slowly and easily.  It can  be helpful to keep track of a log of your progress. Your caregiver can provide you with a simple table to help with this. If you are using the spirometer at home, follow these instructions: SEEK MEDICAL CARE IF:   You are having difficultly using the spirometer.  You have trouble using the spirometer as often as instructed.  Your pain medication is not giving enough relief while using the spirometer.  You develop fever of 100.5 F (38.1 C) or higher. SEEK IMMEDIATE MEDICAL CARE IF:   You cough up bloody sputum that had not been present before.  You develop fever of 102 F (38.9 C) or greater.  You develop worsening pain at or near the incision site. MAKE SURE YOU:   Understand these instructions.  Will watch your condition.  Will get help right away if you are not doing well or get worse. Document Released: 08/01/2006 Document Revised: 06/13/2011 Document Reviewed: 10/02/2006 Exit[employer] Patient Information 2014 Exit[employer], [place].   ________________________________________________________________________  WHAT IS A BLOOD TRANSFUSION? Blood Transfusion Information  A transfusion is the replacement of blood or some of its parts. Blood is made up of multiple cells which provide different functions.  Red blood cells carry oxygen and are used for blood loss replacement.  White blood cells fight against infection.  Platelets control bleeding.  Plasma helps clot blood.  Other blood products are available for specialized needs, such as hemophilia or other clotting disorders. BEFORE THE TRANSFUSION  Who gives blood for transfusions?   Healthy volunteers who are fully evaluated to make sure their blood is safe. This is blood bank blood. Transfusion therapy is the safest it has ever been in the practice of medicine. Before blood is taken from a donor, a complete history is taken to make sure that person has no history of diseases nor engages in risky social behavior (examples are  intravenous drug use or sexual activity with multiple partners). The donor's travel history is screened to minimize risk of transmitting infections, such as malaria. The donated blood is tested for signs of infectious diseases, such as HIV and hepatitis. The blood is then tested to be sure it is compatible with you in order to minimize the chance of a transfusion reaction. If you or a relative donates blood, this is often done in anticipation of surgery and is not appropriate for emergency situations. It takes many days to process the donated blood. RISKS AND COMPLICATIONS Although transfusion therapy is very safe and saves many lives, the main dangers of transfusion include:   Getting an infectious disease.  Developing a transfusion reaction. This is an allergic reaction to something in the blood you were given. Every precaution is taken to prevent this. The decision to have a blood transfusion has been considered carefully by your caregiver before blood is given. Blood is not given unless the benefits outweigh the risks. AFTER THE TRANSFUSION  Right after receiving a blood transfusion, you will usually feel much better and more energetic. This is especially true if your red blood cells have gotten low (anemic). The transfusion raises the level of the red blood cells which carry oxygen, and this usually causes an energy increase.  The nurse  administering the transfusion will monitor you carefully for complications. HOME CARE INSTRUCTIONS  No special instructions are needed after a transfusion. You may find your energy is better. Speak with your caregiver about any limitations on activity for underlying diseases you may have. SEEK MEDICAL CARE IF:   Your condition is not improving after your transfusion.  You develop redness or irritation at the intravenous (IV) site. SEEK IMMEDIATE MEDICAL CARE IF:  Any of the following symptoms occur over the next 12 hours:  Shaking chills.  You have a  temperature by mouth above 102 F (38.9 C), not controlled by medicine.  Chest, back, or muscle pain.  People around you feel you are not acting correctly or are confused.  Shortness of breath or difficulty breathing.  Dizziness and fainting.  You get a rash or develop hives.  You have a decrease in urine output.  Your urine turns a dark color or changes to pink, red, or brown. Any of the following symptoms occur over the next 10 days:  You have a temperature by mouth above 102 F (38.9 C), not controlled by medicine.  Shortness of breath.  Weakness after normal activity.  The white part of the eye turns yellow (jaundice).  You have a decrease in the amount of urine or are urinating less often.  Your urine turns a dark color or changes to pink, red, or brown. Document Released: 03/18/2000 Document Revised: 06/13/2011 Document Reviewed: 11/05/2007 [facility] Patient Information 2014 [place], [place].  _______________________________________________________________________

## 2017-06-08 ENCOUNTER — Encounter (HOSPITAL_COMMUNITY): Payer: Self-pay

## 2017-06-08 ENCOUNTER — Encounter (HOSPITAL_COMMUNITY)
Admission: RE | Admit: 2017-06-08 | Discharge: 2017-06-08 | Disposition: A | Discharge status: Home / Self Care | Payer: Medicare Other | Source: Ambulatory Visit | Attending: Orthopaedic Surgery | Admitting: Orthopaedic Surgery

## 2017-06-08 ENCOUNTER — Other Ambulatory Visit: Payer: Self-pay

## 2017-06-08 DIAGNOSIS — Z01812 Encounter for preprocedural laboratory examination: Secondary | ICD-10-CM | POA: Diagnosis present

## 2017-06-08 HISTORY — DX: Osteoarthritis of knee, unspecified: M17.9

## 2017-06-08 HISTORY — DX: Gastro-esophageal reflux disease without esophagitis: K21.9

## 2017-06-08 HISTORY — DX: Major depressive disorder, single episode, unspecified: F32.9

## 2017-06-08 HISTORY — DX: Cramp and spasm: R25.2

## 2017-06-08 HISTORY — DX: Anxiety disorder, unspecified: F41.9

## 2017-06-08 HISTORY — DX: Unilateral primary osteoarthritis, unspecified knee: M17.10

## 2017-06-08 HISTORY — DX: Plantar fascial fibromatosis: M72.2

## 2017-06-08 HISTORY — DX: Depression, unspecified: F32.A

## 2017-06-08 LAB — ABO/RH: ABO/RH(D): O POS

## 2017-06-08 LAB — CBC
HCT: 42.1 % (ref 39.0–52.0)
Hemoglobin: 13.5 g/dL (ref 13.0–17.0)
MCH: 29.5 pg (ref 26.0–34.0)
MCHC: 32.1 g/dL (ref 30.0–36.0)
MCV: 92.1 fL (ref 78.0–100.0)
PLATELETS: 215 10*3/uL (ref 150–400)
RBC: 4.57 MIL/uL (ref 4.22–5.81)
RDW: 14.2 % (ref 11.5–15.5)
WBC: 5.5 10*3/uL (ref 4.0–10.5)

## 2017-06-08 LAB — SURGICAL PCR SCREEN
MRSA, PCR: NEGATIVE
Staphylococcus aureus: NEGATIVE

## 2017-06-16 ENCOUNTER — Inpatient Hospital Stay (HOSPITAL_COMMUNITY): Payer: Medicare Other | Admitting: Registered Nurse

## 2017-06-16 ENCOUNTER — Other Ambulatory Visit: Payer: Self-pay

## 2017-06-16 ENCOUNTER — Inpatient Hospital Stay (HOSPITAL_COMMUNITY): Payer: Medicare Other

## 2017-06-16 ENCOUNTER — Encounter (HOSPITAL_COMMUNITY): Payer: Self-pay | Admitting: Emergency Medicine

## 2017-06-16 ENCOUNTER — Encounter (HOSPITAL_COMMUNITY)
Admission: RE | Disposition: A | Discharge status: Home Health | Payer: Self-pay | Source: Ambulatory Visit | Attending: Orthopaedic Surgery

## 2017-06-16 ENCOUNTER — Inpatient Hospital Stay (HOSPITAL_COMMUNITY)
Admission: RE | Admit: 2017-06-16 | Discharge: 2017-06-18 | DRG: 468 | Disposition: A | Discharge status: Home Health | Payer: Medicare Other | Source: Ambulatory Visit | Attending: Orthopaedic Surgery | Admitting: Orthopaedic Surgery

## 2017-06-16 DIAGNOSIS — Z96651 Presence of right artificial knee joint: Secondary | ICD-10-CM

## 2017-06-16 DIAGNOSIS — Z888 Allergy status to other drugs, medicaments and biological substances status: Secondary | ICD-10-CM

## 2017-06-16 DIAGNOSIS — K219 Gastro-esophageal reflux disease without esophagitis: Secondary | ICD-10-CM | POA: Diagnosis present

## 2017-06-16 DIAGNOSIS — Z87891 Personal history of nicotine dependence: Secondary | ICD-10-CM | POA: Diagnosis not present

## 2017-06-16 DIAGNOSIS — T84032A Mechanical loosening of internal right knee prosthetic joint, initial encounter: Principal | ICD-10-CM | POA: Diagnosis present

## 2017-06-16 DIAGNOSIS — G8929 Other chronic pain: Secondary | ICD-10-CM | POA: Diagnosis present

## 2017-06-16 DIAGNOSIS — Z885 Allergy status to narcotic agent status: Secondary | ICD-10-CM | POA: Diagnosis not present

## 2017-06-16 DIAGNOSIS — T84092A Other mechanical complication of internal right knee prosthesis, initial encounter: Secondary | ICD-10-CM | POA: Diagnosis present

## 2017-06-16 DIAGNOSIS — T84038A Mechanical loosening of other internal prosthetic joint, initial encounter: Secondary | ICD-10-CM | POA: Diagnosis present

## 2017-06-16 DIAGNOSIS — Z96659 Presence of unspecified artificial knee joint: Secondary | ICD-10-CM | POA: Diagnosis present

## 2017-06-16 HISTORY — PX: TOTAL KNEE REVISION: SHX996

## 2017-06-16 LAB — TYPE AND SCREEN
ABO/RH(D): O POS
Antibody Screen: NEGATIVE

## 2017-06-16 SURGERY — TOTAL KNEE REVISION
Anesthesia: General | Site: Knee | Laterality: Right

## 2017-06-16 MED ORDER — SODIUM CHLORIDE 0.9 % IJ SOLN
INTRAMUSCULAR | Status: DC | PRN
  Administered 2017-06-16: 20 mL

## 2017-06-16 MED ORDER — MIDAZOLAM HCL 2 MG/2ML IJ SOLN
INTRAMUSCULAR | Status: AC
  Filled 2017-06-16: qty 2

## 2017-06-16 MED ORDER — MIDAZOLAM HCL 2 MG/2ML IJ SOLN
2.0000 mg | Freq: Once | INTRAMUSCULAR | Status: AC
  Administered 2017-06-16: 1 mg via INTRAVENOUS
  Filled 2017-06-16: qty 2

## 2017-06-16 MED ORDER — LABETALOL HCL 5 MG/ML IV SOLN
INTRAVENOUS | Status: DC | PRN
  Administered 2017-06-16: 2.5 mg via INTRAVENOUS

## 2017-06-16 MED ORDER — DOCUSATE SODIUM 100 MG PO CAPS
100.0000 mg | ORAL_CAPSULE | Freq: Two times a day (BID) | ORAL | Status: DC
  Administered 2017-06-16 – 2017-06-18 (×4): 100 mg via ORAL
  Filled 2017-06-16 (×4): qty 1

## 2017-06-16 MED ORDER — FENTANYL CITRATE (PF) 100 MCG/2ML IJ SOLN
INTRAMUSCULAR | Status: AC
  Filled 2017-06-16: qty 2

## 2017-06-16 MED ORDER — ACETAMINOPHEN 325 MG PO TABS
325.0000 mg | ORAL_TABLET | Freq: Four times a day (QID) | ORAL | Status: DC | PRN
  Administered 2017-06-17: 650 mg via ORAL
  Filled 2017-06-16: qty 2

## 2017-06-16 MED ORDER — DEXAMETHASONE SODIUM PHOSPHATE 10 MG/ML IJ SOLN
INTRAMUSCULAR | Status: AC
  Filled 2017-06-16: qty 1

## 2017-06-16 MED ORDER — DIPHENHYDRAMINE HCL 12.5 MG/5ML PO ELIX
12.5000 mg | ORAL_SOLUTION | ORAL | Status: DC | PRN
  Administered 2017-06-17: 12.5 mg via ORAL
  Filled 2017-06-16: qty 5

## 2017-06-16 MED ORDER — SODIUM CHLORIDE 0.9 % IJ SOLN
INTRAMUSCULAR | Status: AC
  Filled 2017-06-16: qty 50

## 2017-06-16 MED ORDER — ONDANSETRON HCL 4 MG/2ML IJ SOLN
INTRAMUSCULAR | Status: DC | PRN
Start: 2017-06-16 — End: 2017-06-16
  Administered 2017-06-16: 4 mg via INTRAVENOUS

## 2017-06-16 MED ORDER — PROPOFOL 10 MG/ML IV BOLUS
INTRAVENOUS | Status: DC | PRN
  Administered 2017-06-16: 350 mg via INTRAVENOUS
  Administered 2017-06-16: 30 mg via INTRAVENOUS

## 2017-06-16 MED ORDER — FENTANYL CITRATE (PF) 100 MCG/2ML IJ SOLN
100.0000 ug | Freq: Once | INTRAMUSCULAR | Status: AC
  Administered 2017-06-16: 50 ug via INTRAVENOUS
  Filled 2017-06-16: qty 2

## 2017-06-16 MED ORDER — DEXAMETHASONE SODIUM PHOSPHATE 10 MG/ML IJ SOLN
INTRAMUSCULAR | Status: DC | PRN
  Administered 2017-06-16: 10 mg via INTRAVENOUS

## 2017-06-16 MED ORDER — PHENYLEPHRINE HCL 10 MG/ML IJ SOLN
INTRAMUSCULAR | Status: DC | PRN
  Administered 2017-06-16: 80 ug via INTRAVENOUS

## 2017-06-16 MED ORDER — PROPOFOL 500 MG/50ML IV EMUL
INTRAVENOUS | Status: DC | PRN
  Administered 2017-06-16: 75 ug/kg/min via INTRAVENOUS

## 2017-06-16 MED ORDER — FENTANYL CITRATE (PF) 250 MCG/5ML IJ SOLN
INTRAMUSCULAR | Status: AC
  Filled 2017-06-16: qty 5

## 2017-06-16 MED ORDER — METOCLOPRAMIDE HCL 5 MG PO TABS: 5.0000 mg | ORAL_TABLET | Freq: Three times a day (TID) | ORAL | Status: DC | PRN

## 2017-06-16 MED ORDER — OXYCODONE HCL 5 MG PO TABS
10.0000 mg | ORAL_TABLET | ORAL | Status: DC | PRN
  Administered 2017-06-17: 15 mg via ORAL
  Administered 2017-06-18: 10 mg via ORAL
  Filled 2017-06-16: qty 2
  Filled 2017-06-16: qty 3

## 2017-06-16 MED ORDER — SODIUM CHLORIDE 0.9 % IV SOLN
INTRAVENOUS | Status: DC
  Administered 2017-06-16: 1000 mL via INTRAVENOUS
  Administered 2017-06-17: 13:00:00 via INTRAVENOUS

## 2017-06-16 MED ORDER — 0.9 % SODIUM CHLORIDE (POUR BTL) OPTIME
TOPICAL | Status: DC | PRN
  Administered 2017-06-16: 1000 mL

## 2017-06-16 MED ORDER — ALPRAZOLAM 0.5 MG PO TABS: 0.5000 mg | ORAL_TABLET | Freq: Every day | ORAL | Status: DC | PRN

## 2017-06-16 MED ORDER — PANTOPRAZOLE SODIUM 40 MG PO TBEC
40.0000 mg | DELAYED_RELEASE_TABLET | Freq: Every day | ORAL | Status: DC
  Administered 2017-06-16 – 2017-06-18 (×3): 40 mg via ORAL
  Filled 2017-06-16 (×3): qty 1

## 2017-06-16 MED ORDER — HYDROMORPHONE HCL 1 MG/ML IJ SOLN
INTRAMUSCULAR | Status: AC
  Filled 2017-06-16: qty 2

## 2017-06-16 MED ORDER — RIVAROXABAN 10 MG PO TABS
10.0000 mg | ORAL_TABLET | Freq: Every day | ORAL | Status: DC
  Administered 2017-06-17 – 2017-06-18 (×2): 10 mg via ORAL
  Filled 2017-06-16 (×2): qty 1

## 2017-06-16 MED ORDER — METOCLOPRAMIDE HCL 5 MG/ML IJ SOLN: 5.0000 mg | Freq: Three times a day (TID) | INTRAMUSCULAR | Status: DC | PRN

## 2017-06-16 MED ORDER — ONDANSETRON HCL 4 MG PO TABS: 4.0000 mg | ORAL_TABLET | Freq: Four times a day (QID) | ORAL | Status: DC | PRN

## 2017-06-16 MED ORDER — PROPOFOL 10 MG/ML IV BOLUS
INTRAVENOUS | Status: AC
  Filled 2017-06-16: qty 60

## 2017-06-16 MED ORDER — CEFAZOLIN SODIUM-DEXTROSE 2-4 GM/100ML-% IV SOLN
INTRAVENOUS | Status: AC
  Filled 2017-06-16: qty 100

## 2017-06-16 MED ORDER — MIDAZOLAM HCL 5 MG/5ML IJ SOLN
INTRAMUSCULAR | Status: DC | PRN
  Administered 2017-06-16: 1 mg via INTRAVENOUS

## 2017-06-16 MED ORDER — OXYCODONE HCL 5 MG PO TABS
5.0000 mg | ORAL_TABLET | ORAL | Status: DC | PRN
  Administered 2017-06-16: 5 mg via ORAL
  Administered 2017-06-16 – 2017-06-17 (×2): 10 mg via ORAL
  Filled 2017-06-16: qty 1
  Filled 2017-06-16 (×2): qty 2

## 2017-06-16 MED ORDER — PHENOL 1.4 % MT LIQD: 1.0000 | OROMUCOSAL | Status: DC | PRN

## 2017-06-16 MED ORDER — LACTATED RINGERS IV SOLN
INTRAVENOUS | Status: DC
  Administered 2017-06-16 (×3): via INTRAVENOUS

## 2017-06-16 MED ORDER — SODIUM CHLORIDE 0.9 % IR SOLN
Status: DC | PRN
  Administered 2017-06-16: 2000 mL

## 2017-06-16 MED ORDER — SODIUM CHLORIDE 0.9 % IR SOLN: Status: DC | PRN

## 2017-06-16 MED ORDER — ONDANSETRON HCL 4 MG/2ML IJ SOLN
4.0000 mg | Freq: Four times a day (QID) | INTRAMUSCULAR | Status: DC | PRN
  Filled 2017-06-16: qty 2

## 2017-06-16 MED ORDER — GABAPENTIN 100 MG PO CAPS
100.0000 mg | ORAL_CAPSULE | Freq: Three times a day (TID) | ORAL | Status: DC
  Administered 2017-06-17 – 2017-06-18 (×5): 100 mg via ORAL
  Filled 2017-06-16 (×5): qty 1

## 2017-06-16 MED ORDER — STERILE WATER FOR IRRIGATION IR SOLN
Status: DC | PRN
  Administered 2017-06-16: 2000 mL

## 2017-06-16 MED ORDER — OXYCODONE HCL ER 10 MG PO T12A
10.0000 mg | EXTENDED_RELEASE_TABLET | Freq: Two times a day (BID) | ORAL | Status: DC
  Administered 2017-06-17 – 2017-06-18 (×4): 10 mg via ORAL
  Filled 2017-06-16 (×4): qty 1

## 2017-06-16 MED ORDER — MAGNESIUM OXIDE 400 (241.3 MG) MG PO TABS
400.0000 mg | ORAL_TABLET | Freq: Every day | ORAL | Status: DC
  Administered 2017-06-17 – 2017-06-18 (×2): 400 mg via ORAL
  Filled 2017-06-16 (×2): qty 1

## 2017-06-16 MED ORDER — ONDANSETRON HCL 4 MG/2ML IJ SOLN: 4.0000 mg | Freq: Once | INTRAMUSCULAR | Status: DC | PRN

## 2017-06-16 MED ORDER — MENTHOL 3 MG MT LOZG: 1.0000 | LOZENGE | OROMUCOSAL | Status: DC | PRN

## 2017-06-16 MED ORDER — PHENYLEPHRINE HCL 10 MG/ML IJ SOLN
INTRAMUSCULAR | Status: AC
  Filled 2017-06-16: qty 1

## 2017-06-16 MED ORDER — ONDANSETRON HCL 4 MG/2ML IJ SOLN
INTRAMUSCULAR | Status: AC
Start: 2017-06-16 — End: 2017-06-16
  Filled 2017-06-16: qty 2

## 2017-06-16 MED ORDER — CHLORHEXIDINE GLUCONATE 4 % EX LIQD: 60.0000 mL | Freq: Once | CUTANEOUS | Status: DC

## 2017-06-16 MED ORDER — FENTANYL CITRATE (PF) 100 MCG/2ML IJ SOLN
INTRAMUSCULAR | Status: DC | PRN
  Administered 2017-06-16 (×2): 50 ug via INTRAVENOUS
  Administered 2017-06-16: 25 ug via INTRAVENOUS
  Administered 2017-06-16: 50 ug via INTRAVENOUS
  Administered 2017-06-16: 25 ug via INTRAVENOUS
  Administered 2017-06-16 (×5): 50 ug via INTRAVENOUS

## 2017-06-16 MED ORDER — ZOLPIDEM TARTRATE 5 MG PO TABS: 5.0000 mg | ORAL_TABLET | Freq: Every evening | ORAL | Status: DC | PRN

## 2017-06-16 MED ORDER — TRANEXAMIC ACID 1000 MG/10ML IV SOLN
1000.0000 mg | INTRAVENOUS | Status: DC
  Filled 2017-06-16: qty 10

## 2017-06-16 MED ORDER — ROPIVACAINE HCL 7.5 MG/ML IJ SOLN
INTRAMUSCULAR | Status: DC | PRN
  Administered 2017-06-16: 20 mL via PERINEURAL

## 2017-06-16 MED ORDER — HYDROMORPHONE HCL 1 MG/ML IJ SOLN
0.5000 mg | INTRAMUSCULAR | Status: DC | PRN
  Administered 2017-06-16: 1 mg via INTRAVENOUS
  Filled 2017-06-16: qty 1

## 2017-06-16 MED ORDER — METHOCARBAMOL 500 MG PO TABS
500.0000 mg | ORAL_TABLET | Freq: Four times a day (QID) | ORAL | Status: DC | PRN
  Administered 2017-06-17 – 2017-06-18 (×3): 500 mg via ORAL
  Filled 2017-06-16 (×3): qty 1

## 2017-06-16 MED ORDER — ACETAMINOPHEN 10 MG/ML IV SOLN
1000.0000 mg | Freq: Once | INTRAVENOUS | Status: AC
  Administered 2017-06-16: 1000 mg via INTRAVENOUS
  Filled 2017-06-16: qty 100

## 2017-06-16 MED ORDER — HYDROMORPHONE HCL 1 MG/ML IJ SOLN
0.2500 mg | INTRAMUSCULAR | Status: DC | PRN
  Administered 2017-06-16 (×3): 0.5 mg via INTRAVENOUS

## 2017-06-16 MED ORDER — CEFAZOLIN SODIUM-DEXTROSE 2-4 GM/100ML-% IV SOLN
2.0000 g | INTRAVENOUS | Status: AC
  Administered 2017-06-16: 2 g via INTRAVENOUS
  Filled 2017-06-16: qty 100

## 2017-06-16 MED ORDER — CEFAZOLIN SODIUM-DEXTROSE 1-4 GM/50ML-% IV SOLN
1.0000 g | Freq: Four times a day (QID) | INTRAVENOUS | Status: AC
  Administered 2017-06-16 – 2017-06-17 (×2): 1 g via INTRAVENOUS
  Filled 2017-06-16 (×2): qty 50

## 2017-06-16 MED ORDER — ALUM & MAG HYDROXIDE-SIMETH 200-200-20 MG/5ML PO SUSP
30.0000 mL | ORAL | Status: DC | PRN
  Administered 2017-06-18: 30 mL via ORAL
  Filled 2017-06-16: qty 30

## 2017-06-16 MED ORDER — DEXTROSE 5 % IV SOLN
500.0000 mg | Freq: Four times a day (QID) | INTRAVENOUS | Status: DC | PRN
  Administered 2017-06-16: 500 mg via INTRAVENOUS
  Filled 2017-06-16: qty 550

## 2017-06-16 MED ORDER — POLYETHYLENE GLYCOL 3350 17 G PO PACK: 17.0000 g | PACK | Freq: Every day | ORAL | Status: DC | PRN

## 2017-06-16 MED ORDER — BUPIVACAINE LIPOSOME 1.3 % IJ SUSP
20.0000 mL | Freq: Once | INTRAMUSCULAR | Status: AC
  Administered 2017-06-16: 20 mL
  Filled 2017-06-16: qty 20

## 2017-06-16 SURGICAL SUPPLY — 59 items
ADAPTER BOLT FEMORAL +2/-2 (Knees) ×2 IMPLANT
AUG FEM DIST PFC 4 8 RT (Knees) ×2 IMPLANT
AUGMENT FEM DIST PFC 4 8 RT (Knees) ×2 IMPLANT
BAG ZIPLOCK 12X15 (MISCELLANEOUS) IMPLANT
BANDAGE ACE 6X5 VEL STRL LF (GAUZE/BANDAGES/DRESSINGS) ×2 IMPLANT
BENZOIN TINCTURE PRP APPL 2/3 (GAUZE/BANDAGES/DRESSINGS) IMPLANT
BLADE SAG 18X100X1.27 (BLADE) ×2 IMPLANT
BLADE SAW SGTL 11.0X1.19X90.0M (BLADE) ×2 IMPLANT
BONE CEMENT GENTAMICIN (Cement) ×6 IMPLANT
CEMENT BONE GENTAMICIN 40 (Cement) ×3 IMPLANT
CLOTH BEACON ORANGE TIMEOUT ST (SAFETY) ×2 IMPLANT
COMP FEM CEM RT SZ4 (Orthopedic Implant) ×2 IMPLANT
COMPONENT FEM CEM RT SZ4 (Orthopedic Implant) ×1 IMPLANT
COVER SURGICAL LIGHT HANDLE (MISCELLANEOUS) ×2 IMPLANT
CUFF TOURN SGL QUICK 34 (TOURNIQUET CUFF) ×1
CUFF TRNQT CYL 34X4X40X1 (TOURNIQUET CUFF) ×1 IMPLANT
DISTAL AUGMENT (Knees) ×2 IMPLANT
DRAPE TOP 10253 STERILE (DRAPES) IMPLANT
DRAPE U-SHAPE 47X51 STRL (DRAPES) ×2 IMPLANT
DRSG PAD ABDOMINAL 8X10 ST (GAUZE/BANDAGES/DRESSINGS) ×2 IMPLANT
DURAPREP 26ML APPLICATOR (WOUND CARE) ×2 IMPLANT
ELECT REM PT RETURN 15FT ADLT (MISCELLANEOUS) ×2 IMPLANT
EVACUATOR 1/8 PVC DRAIN (DRAIN) IMPLANT
FEMORAL ADAPTER (Orthopedic Implant) ×2 IMPLANT
GAUZE SPONGE 4X4 12PLY STRL (GAUZE/BANDAGES/DRESSINGS) ×2 IMPLANT
GAUZE XEROFORM 5X9 LF (GAUZE/BANDAGES/DRESSINGS) ×2 IMPLANT
GLOVE BIO SURGEON STRL SZ7.5 (GLOVE) ×2 IMPLANT
GLOVE BIOGEL PI IND STRL 8 (GLOVE) ×2 IMPLANT
GLOVE BIOGEL PI INDICATOR 8 (GLOVE) ×2
GLOVE ECLIPSE 8.0 STRL XLNG CF (GLOVE) ×2 IMPLANT
GOWN STRL REUS W/TWL XL LVL3 (GOWN DISPOSABLE) ×4 IMPLANT
HANDPIECE INTERPULSE COAX TIP (DISPOSABLE) ×1
IMMOBILIZER KNEE 20 (SOFTGOODS) ×2
IMMOBILIZER KNEE 20 THIGH 36 (SOFTGOODS) ×1 IMPLANT
INSERT TC3 SZ4 4X17.5MM (Knees) ×2 IMPLANT
PACK TOTAL KNEE CUSTOM (KITS) ×2 IMPLANT
PADDING CAST COTTON 6X4 STRL (CAST SUPPLIES) ×2 IMPLANT
POSITIONER SURGICAL ARM (MISCELLANEOUS) ×2 IMPLANT
SET HNDPC FAN SPRY TIP SCT (DISPOSABLE) ×1 IMPLANT
SET PAD KNEE POSITIONER (MISCELLANEOUS) ×2 IMPLANT
SPONGE LAP 18X18 X RAY DECT (DISPOSABLE) ×4 IMPLANT
STAPLER VISISTAT 35W (STAPLE) IMPLANT
STEM TIBIA PFC 13X30MM (Stem) ×2 IMPLANT
STEM TIBIA PFC 13X60MM (Stem) ×2 IMPLANT
STRIP CLOSURE SKIN 1/2X4 (GAUZE/BANDAGES/DRESSINGS) IMPLANT
SUT MNCRL AB 4-0 PS2 18 (SUTURE) IMPLANT
SUT VIC AB 0 CT1 36 (SUTURE) ×2 IMPLANT
SUT VIC AB 1 CT1 36 (SUTURE) ×4 IMPLANT
SUT VIC AB 2-0 CT1 27 (SUTURE) ×2
SUT VIC AB 2-0 CT1 TAPERPNT 27 (SUTURE) ×2 IMPLANT
SWAB COLLECTION DEVICE MRSA (MISCELLANEOUS) ×2 IMPLANT
SWAB CULTURE ESWAB REG 1ML (MISCELLANEOUS) ×2 IMPLANT
TOWER CARTRIDGE SMART MIX (DISPOSABLE) ×2 IMPLANT
TRAY FOLEY W/METER SILVER 16FR (SET/KITS/TRAYS/PACK) ×2 IMPLANT
TRAY REVISION SZ 4 (Knees) ×2 IMPLANT
TUBE KAMVAC SUCTION (TUBING) IMPLANT
WATER STERILE IRR 1000ML POUR (IV SOLUTION) ×4 IMPLANT
WEDGE SIZE 4 5MM (Knees) ×4 IMPLANT
WRAP KNEE MAXI GEL POST OP (GAUZE/BANDAGES/DRESSINGS) ×2 IMPLANT

## 2017-06-16 NOTE — Anesthesia Procedure Notes (Addendum)
Spinal  Patient location during procedure: OR End time: 06/16/2017 12:35 PM Staffing Resident/CRNA: Lissa Morales, CRNA Performed: resident/CRNA  Preanesthetic Checklist Completed: patient identified, site marked, surgical consent, pre-op evaluation, timeout performed, IV checked, risks and benefits discussed and monitors and equipment checked Spinal Block Patient position: sitting Prep: DuraPrep Patient monitoring: heart rate, continuous pulse ox and blood pressure Approach: midline Location: L5-S1 Injection technique: single-shot Needle Needle type: Pencan  Needle gauge: 24 G Needle length: 9 cm Additional Notes Expiration date of kit checked and confirmed. Patient tolerated procedure well, without complications      Good .CSF flow x 2.

## 2017-06-16 NOTE — Plan of Care (Signed)
Plan of care discussed with pt. Any questioned or concerns answered.

## 2017-06-16 NOTE — Anesthesia Preprocedure Evaluation (Addendum)
Anesthesia Evaluation  Patient identified by MRN, date of birth, ID band Patient awake    Reviewed: Allergy & Precautions, NPO status , Patient's Chart, lab work & pertinent test results  Airway Mallampati: II  TM Distance: >3 FB Neck ROM: Full    Dental  (+) Dental Advisory Given   Pulmonary former smoker,    breath sounds clear to auscultation       Cardiovascular negative cardio ROS   Rhythm:Regular Rate:Normal     Neuro/Psych Anxiety Depression    GI/Hepatic Neg liver ROS, hiatal hernia, GERD  Medicated,  Endo/Other  negative endocrine ROS  Renal/GU negative Renal ROS     Musculoskeletal  (+) Arthritis ,   Abdominal   Peds  Hematology negative hematology ROS (+)   Anesthesia Other Findings   Reproductive/Obstetrics                            Lab Results  Component Value Date   WBC 5.5 06/08/2017   HGB 13.5 06/08/2017   HCT 42.1 06/08/2017   MCV 92.1 06/08/2017   PLT 215 06/08/2017   No results found for: CREATININE, BUN, NA, K, CL, CO2  Anesthesia Physical Anesthesia Plan  ASA: II  Anesthesia Plan: Spinal and MAC   Post-op Pain Management:  Regional for Post-op pain   Induction: Intravenous  PONV Risk Score and Plan: 2 and Ondansetron, Propofol infusion and Treatment may vary due to age or medical condition  Airway Management Planned: Natural Airway and Simple Face Mask  Additional Equipment:   Intra-op Plan:   Post-operative Plan:   Informed Consent: I have reviewed the patients History and Physical, chart, labs and discussed the procedure including the risks, benefits and alternatives for the proposed anesthesia with the patient or authorized representative who has indicated his/her understanding and acceptance.     Plan Discussed with: CRNA  Anesthesia Plan Comments:        Anesthesia Quick Evaluation

## 2017-06-16 NOTE — Anesthesia Procedure Notes (Signed)
Anesthesia Regional Block: Adductor canal block   Pre-Anesthetic Checklist: ,, timeout performed, Correct Patient, Correct Site, Correct Laterality, Correct Procedure, Correct Position, site marked, Risks and benefits discussed,  Surgical consent,  Pre-op evaluation,  At surgeon's request and post-op pain management  Laterality: Right  Prep: chloraprep       Needles:  Injection technique: Single-shot  Needle Type: Echogenic Needle     Needle Length: 9cm  Needle Gauge: 21     Additional Needles:   Procedures:,,,, ultrasound used (permanent image in chart),,,,  Narrative:  Start time: 06/16/2017 11:30 AM End time: 06/16/2017 11:37 AM Injection made incrementally with aspirations every 5 mL.  Performed by: Personally  Anesthesiologist: Marcene DuosFitzgerald, Kalil Woessner, MD

## 2017-06-16 NOTE — Transfer of Care (Signed)
Immediate Anesthesia Transfer of Care Note  Patient: Tommy Bailey  Procedure(s) Performed: RIGHT TOTAL KNEE REVISION VS. LYSIS OF ADHESIONS AND POLY LINER EXCHANGE (Right Knee)  Patient Location: PACU  Anesthesia Type:GA combined with regional for post-op pain  Level of Consciousness: drowsy  Airway & Oxygen Therapy: Patient Spontanous Breathing and Patient connected to face mask oxygen  Post-op Assessment: Report given to RN and Post -op Vital signs reviewed and stable  Post vital signs: Reviewed and stable  Last Vitals:  Vitals:   06/16/17 1150 06/16/17 1155  BP: (!) 143/93 (!) 139/94  Pulse: 75 73  Resp: 19 (!) 8  Temp:    SpO2: 96% 97%    Last Pain:  Vitals:   06/16/17 1001  TempSrc:   PainSc: 3       Patients Stated Pain Goal: 4 (06/16/17 1001)  Complications: No apparent anesthesia complications

## 2017-06-16 NOTE — Anesthesia Procedure Notes (Addendum)
Procedure Name: MAC Date/Time: 06/16/2017 12:36 PM Performed by: Lissa Morales, CRNA Pre-anesthesia Checklist: Patient identified, Emergency Drugs available, Suction available, Patient being monitored and Timeout performed Patient Re-evaluated:Patient Re-evaluated prior to induction Oxygen Delivery Method: Simple face mask Placement Confirmation: positive ETCO2

## 2017-06-16 NOTE — H&P (Signed)
TOTAL KNEE REVISION ADMISSION H&P  Patient is being admitted for right revision total knee arthroplasty.  Subjective:  Chief Complaint:right knee pain.  HPI: Tommy Bailey, 72 y.o. male, has a history of pain and functional disability in the right knee(s) due to failed previous arthroplasty and patient has failed non-surgical conservative treatments for greater than 12 weeks to include NSAID's and/or analgesics, corticosteriod injections, flexibility and strengthening excercises and activity modification. The indications for the revision of the total knee arthroplasty are loosening of one or more components. Onset of symptoms was gradual starting 1 years ago with gradually worsening course since that time.  Prior procedures on the right knee(s) include arthroplasty.  Patient currently rates pain in the right knee(s) at 10 out of 10 with activity. There is night pain, worsening of pain with activity and weight bearing, pain that interferes with activities of daily living, pain with passive range of motion and joint swelling.  Patient has evidence of prosthetic loosening by imaging studies. This condition presents safety issues increasing the risk of falls.  There is no current active infection.  Patient Active Problem List   Diagnosis Date Noted  . 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 Presence of right artificial knee joint 05/10/2017  . Chronic pain of right knee 05/10/2017  . Plantar fasciitis of right foot 01/26/2015  . Equinus deformity of foot, acquired 01/26/2015  . Rectus diastasis 09/22/2011   Past Medical History:  Diagnosis Date  . Anxiety   . Arthritis   . Depression   . 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 GERD (gastroesophageal reflux disease)   . Hiatal hernia   . Muscle cramps    back and upper leg  . Plantar fasciitis   . SOB (shortness of breath)     Past Surgical History:  Procedure Laterality Date  . COLONOSCOPY    . SHOULDER SURGERY   2008   both  . TOTAL KNEE ARTHROPLASTY Right 2015  . UPPER GI ENDOSCOPY    . VENTRAL HERNIA REPAIR  09/2010   with mesh    Current Facility-Administered Medications  Medication Dose Route Frequency Provider Last Rate Last Dose  . ceFAZolin (ANCEF) IVPB 2g/100 mL premix  2 g Intravenous On Call to OR Kirtland Bouchardlark, Gilbert W, PA-C      . chlorhexidine (HIBICLENS) 4 % liquid 4 application  60 mL Topical Once Richardean Canallark, Gilbert W, PA-C      . fentaNYL (SUBLIMAZE) injection 100 mcg  100 mcg Intravenous Once Marcene DuosFitzgerald, Robert, MD      . lactated ringers infusion   Intravenous Continuous Marcene DuosFitzgerald, Robert, MD 50 mL/hr at 06/16/17 1002    . midazolam (VERSED) injection 2 mg  2 mg Intravenous Once Marcene DuosFitzgerald, Robert, MD      . tranexamic acid (CYKLOKAPRON) 1,000 mg in sodium chloride 0.9 % 100 mL IVPB  1,000 mg Intravenous To OR Kathryne HitchBlackman, Kristyanna Barcelo Y, MD       Allergies  Allergen Reactions  . Anesthetics, Amide     Confirm type with medical record. Patient advised after surgery and discharge from hospital he became passed out, unaware of surroundings.  This happened 10 years ago after shoulder surgery per pt, had hives and syncope.  . Pseudoephedrine Hcl Itching  . Tramadol Itching    Social History   Tobacco Use  . Smoking status: Former Smoker    Last attempt to quit: 01/03/2007    Years since quitting: 10.4  . Smokeless  tobacco: Never Used  Substance Use Topics  . Alcohol use: Yes    Alcohol/week: 0.0 oz    Comment: 1 glass red wine with dinner    Family History  Problem Relation Age of Onset  . Cancer Mother        leukemia  . Heart disease Father       Review of Systems  Musculoskeletal: Positive for joint pain.  All other systems reviewed and are negative.    Objective:  Physical Exam  Constitutional: He is oriented to person, place, and time. He appears well-developed and well-nourished.  HENT:  Head: Normocephalic and atraumatic.  Eyes: EOM are normal. Pupils are equal,  round, and reactive to light.  Neck: Normal range of motion. Neck supple.  Cardiovascular: Normal rate and regular rhythm.  Respiratory: Effort normal and breath sounds normal.  GI: Soft. Bowel sounds are normal.  Musculoskeletal:       Right knee: He exhibits decreased range of motion and effusion. Tenderness found. Medial joint line and lateral joint line tenderness noted.  Neurological: He is alert and oriented to person, place, and time.  Skin: Skin is warm and dry.  Psychiatric: He has a normal mood and affect.    Vital signs in last 24 hours: Temp:  [98.2 F (36.8 C)] 98.2 F (36.8 C) (03/15 0937) Pulse Rate:  [77] 77 (03/15 0937) Resp:  [18] 18 (03/15 0937) BP: (149)/(82) 149/82 (03/15 0938) SpO2:  [96 %] 96 % (03/15 0937) Weight:  [226 lb (102.5 kg)] 226 lb (102.5 kg) (03/15 1001)  Labs:  Estimated body mass index is 31.52 kg/m as calculated from the following:   Height as of this encounter: 5\' 11"  (1.803 m).   Weight as of this encounter: 226 lb (102.5 kg).  Imaging Review 3 phase bone scan suggests loosening of the prosthesis  Assessment/Plan:  To the OR today for an open arthrotomy of the right knee with lysis of adhesions and assessment of the component and revision of one or all components as deemed necessary.  Risks and benefits explained in detail and informed consent is obtained.

## 2017-06-16 NOTE — Anesthesia Postprocedure Evaluation (Signed)
Anesthesia Post Note  Patient: Tommy Bailey  Procedure(s) Performed: RIGHT TOTAL KNEE REVISION VS. LYSIS OF ADHESIONS AND POLY LINER EXCHANGE (Right Knee)     Patient location during evaluation: PACU Anesthesia Type: General Level of consciousness: awake and alert Pain management: pain level controlled Vital Signs Assessment: post-procedure vital signs reviewed and stable Respiratory status: spontaneous breathing, nonlabored ventilation, respiratory function stable and patient connected to nasal cannula oxygen Cardiovascular status: blood pressure returned to baseline and stable Postop Assessment: no apparent nausea or vomiting Anesthetic complications: no    Last Vitals:  Vitals:   06/16/17 1855 06/16/17 1940  BP: (!) 144/86 140/73  Pulse:  83  Resp: 16 16  Temp: 36.6 C 36.4 C  SpO2: 94% 100%    Last Pain:  Vitals:   06/16/17 2000  TempSrc:   PainSc: 8                  Kennieth RadFitzgerald, Zeric Baranowski E

## 2017-06-16 NOTE — Anesthesia Procedure Notes (Addendum)
Procedure Name: LMA Insertion Date/Time: 06/16/2017 1:00 PM Performed by: Illene SilverEvans, Stephanie Littman E, CRNA Pre-anesthesia Checklist: Patient identified, Emergency Drugs available, Suction available and Patient being monitored Patient Re-evaluated:Patient Re-evaluated prior to induction Oxygen Delivery Method: Circle system utilized Preoxygenation: Pre-oxygenation with 100% oxygen Induction Type: IV induction Ventilation: Mask ventilation without difficulty LMA: LMA with gastric port inserted LMA Size: 5.0 Tube type: Oral Number of attempts: 1 Placement Confirmation: positive ETCO2 Tube secured with: Tape Dental Injury: Teeth and Oropharynx as per pre-operative assessment  Comments: Spinal partially working motor block  Incomplete and proceeded with GA with LMA

## 2017-06-16 NOTE — Progress Notes (Signed)
AssistedDr. Rob Fitzgerald with right, ultrasound guided, adductor canal block. Side rails up, monitors on throughout procedure. See vital signs in flow sheet. Tolerated Procedure well.  

## 2017-06-16 NOTE — Anesthesia Procedure Notes (Deleted)
Spinal

## 2017-06-16 NOTE — Brief Op Note (Signed)
06/16/2017  3:49 PM  PATIENT:  Tommy Bailey  72 y.o. male  PRE-OPERATIVE DIAGNOSIS:  loose/failed right total knee arthroplasty  POST-OPERATIVE DIAGNOSIS:  loose/failed right total knee arthroplasty  PROCEDURE: Total knee revision arthroplasty with revision of all components  SURGEON:  Surgeon(s) and Role:    Kathryne Hitch* Blackman, Christopher Y, MD - Primary  PHYSICIAN ASSISTANT: Rexene EdisonGil Clark, PA-C  ANESTHESIA:   local, epidural, spinal and general  EBL:  200 mL   COUNTS:  YES  TOURNIQUET:   Total Tourniquet Time Documented: Thigh (Right) - 122 minutes Thigh (Right) - 14 minutes Total: Thigh (Right) - 136 minutes   DICTATION: .Other Dictation: Dictation Number [id number]  PLAN OF CARE: Admit to inpatient   PATIENT DISPOSITION:  PACU - hemodynamically stable.   Delay start of Pharmacological VTE agent (>24hrs) due to surgical blood loss or risk of bleeding: no

## 2017-06-17 LAB — BASIC METABOLIC PANEL
Anion gap: 8 (ref 5–15)
BUN: 17 mg/dL (ref 6–20)
CALCIUM: 8.6 mg/dL — AB (ref 8.9–10.3)
CHLORIDE: 103 mmol/L (ref 101–111)
CO2: 27 mmol/L (ref 22–32)
CREATININE: 1.07 mg/dL (ref 0.61–1.24)
GFR calc non Af Amer: >60 mL/min (ref >60)
Glucose, Bld: 146 mg/dL — ABNORMAL HIGH (ref 65–99)
Potassium: 5.1 mmol/L (ref 3.5–5.1)
Sodium: 138 mmol/L (ref 135–145)

## 2017-06-17 LAB — CBC
HCT: 38.4 % — ABNORMAL LOW (ref 39.0–52.0)
HEMOGLOBIN: 12 g/dL — AB (ref 13.0–17.0)
MCH: 28.8 pg (ref 26.0–34.0)
MCHC: 31.3 g/dL (ref 30.0–36.0)
MCV: 92.3 fL (ref 78.0–100.0)
Platelets: 193 10*3/uL (ref 150–400)
RBC: 4.16 MIL/uL — ABNORMAL LOW (ref 4.22–5.81)
RDW: 14.4 % (ref 11.5–15.5)
WBC: 10.6 10*3/uL — ABNORMAL HIGH (ref 4.0–10.5)

## 2017-06-17 NOTE — Evaluation (Signed)
Occupational Therapy Evaluation and Discharge Patient Details Name: Tommy Bailey MRN: [id number] DOB: 1945-05-11 Today's Date: 06/17/2017    History of Present Illness This 72 yo male s/p Revision arthroplasty, right total knee with revision of both femoral and tibial components   Clinical Impression   This 72 yo male admitted and underwent above presents to acute OT with all education completed with pt and wife, no further OT needs, we will sign off.    Follow Up Recommendations  No OT follow up;Supervision/Assistance - 24 hour    Equipment Recommendations  None recommended by OT       Precautions / Restrictions Precautions Precautions: Fall;Knee Restrictions Weight Bearing Restrictions: No      Mobility Bed Mobility Overal bed mobility: Needs Assistance Bed Mobility: Supine to Sit     Supine to sit: Min assist     General bed mobility comments: HOB flat, no rail, VCs for technique, A for RLE  Transfers Overall transfer level: Needs assistance Equipment used: Rolling walker (2 wheeled) Transfers: Sit to/from Stand Sit to Stand: Min guard              Balance Overall balance assessment: Needs assistance Sitting-balance support: No upper extremity supported;Feet supported Sitting balance-Leahy Scale: Good     Standing balance support: Bilateral upper extremity supported Standing balance-Leahy Scale: Poor                             ADL either performed or assessed with clinical judgement   ADL                                         General ADL Comments: Educated pt and wife on sequence of getting into and out of shower stall to 3n1, how wife may need to steady RW for sit<>stand from regular toilet for pt since he states 3n1 uncomfortable over toilet, sequence of dressing for efficiency and ease.     Vision Baseline Vision/History: Wears glasses Wears Glasses: Reading only Patient Visual Report: No change from  baseline              Pertinent Vitals/Pain Pain Assessment: 0-10 Pain Score: 6  Pain Location: right knee Pain Descriptors / Indicators: Aching;Guarding;Sore Pain Intervention(s): Monitored during session;Limited activity within patient's tolerance     Hand Dominance Right   Extremity/Trunk Assessment Upper Extremity Assessment Upper Extremity Assessment: Overall WFL for tasks assessed   Lower Extremity Assessment Lower Extremity Assessment: Defer to PT evaluation       Communication Communication Communication: No difficulties   Cognition Arousal/Alertness: Awake/alert Behavior During Therapy: WFL for tasks assessed/performed Overall Cognitive Status: Within Functional Limits for tasks assessed                                                Home Living Family/patient expects to be discharged to:: Private residence Living Arrangements: Spouse/significant other Available Help at Discharge: Family;Available 24 hours/day Type of Home: House Home Access: Stairs to enter [employer] of Steps: 3 Entrance Stairs-Rails: (grab bar at top step) Home Layout: One level     Bathroom Shower/Tub: Walk-in shower;Door   [employer] Toilet: Standard     Home Equipment: Environmental consultant - 2 wheels;Bedside commode;Adaptive  equipment Adaptive Equipment: Reacher;Sock aid        Prior Functioning/Environment Level of Independence: Independent                 OT Problem List: Decreased range of motion;Impaired balance (sitting and/or standing);Pain         OT Goals(Current goals can be found in the care plan section) Acute Rehab OT Goals Patient Stated Goal: to get right knee bending better  OT Frequency:                AM-PAC PT "6 Clicks" Daily Activity     Outcome Measure Help from another person eating meals?: None Help from another person taking care of personal grooming?: A Little Help from another person toileting, which includes using  toliet, bedpan, or urinal?: A Little Help from another person bathing (including washing, rinsing, drying)?: A Little Help from another person to put on and taking off regular upper body clothing?: A Little Help from another person to put on and taking off regular lower body clothing?: A Lot 6 Click Score: 18   End of Session Equipment Utilized During Treatment: Gait belt;Rolling walker  Activity Tolerance: Patient tolerated treatment well Patient left: in chair;with call bell/phone within reach;with family/visitor present  OT Visit Diagnosis: Unsteadiness on feet (R26.81);Pain Pain - Right/Left: Right Pain - part of body: Knee                Time: [id number] and 11:44-12:03 (PT saw pt in between due to pt already up on his feet with me when they entered room) OT Time Calculation (min): 16 min/19 min (total 35 min) Charges:  OT General Charges $OT Visit: 1 Visit OT Evaluation $OT Eval Moderate Complexity: 1 Mod OT Treatments $Self Care/Home Management : 8-22 mins Ignacia PalmaCathy Josemaria Brining, OTR/L [phone] 06/17/2017

## 2017-06-17 NOTE — Progress Notes (Signed)
Subjective: 1 Day Post-Op Procedure(s) (LRB): RIGHT TOTAL KNEE REVISION VS. LYSIS OF ADHESIONS AND POLY LINER EXCHANGE (Right) Patient reports pain as moderate.  No complaints this AM.   Objective: Vital signs in last 24 hours: Temp:  [97.6 F (36.4 C)-98.9 F (37.2 C)] 97.7 F (36.5 C) (03/16 0500) Pulse Rate:  [56-88] 83 (03/16 0500) Resp:  [8-24] 20 (03/16 0500) BP: (108-152)/(58-96) 138/58 (03/16 0500) SpO2:  [94 %-100 %] 97 % (03/16 0500) Weight:  [226 lb (102.5 kg)] 226 lb (102.5 kg) (03/15 1001)  Intake/Output from previous day: 03/15 0701 - 03/16 0700 In: 3297.5 [P.O.:240; I.V.:2952.5; IV Piggyback:105] Out: 1180 [Urine:980; Blood:200] Intake/Output this shift: No intake/output data recorded.  Recent Labs    06/17/17 0600  HGB 12.0*   Recent Labs    06/17/17 0600  WBC 10.6*  RBC 4.16*  HCT 38.4*  PLT 193   No results for input(s): NA, K, CL, CO2, BUN, CREATININE, GLUCOSE, CALCIUM in the last 72 hours. No results for input(s): LABPT, INR in the last 72 hours.   Right lower extremity: Dorsiflexion/Plantar flexion intact Incision: no drainage Compartment soft Calf supple non tender  Assessment/Plan: 1 Day Post-Op Procedure(s) (LRB): RIGHT TOTAL KNEE REVISION VS. LYSIS OF ADHESIONS AND POLY LINER EXCHANGE (Right) Up with therapy  Dressing changed   Tommy Bailey 06/17/2017, 7:37 AM

## 2017-06-17 NOTE — Op Note (Signed)
NAME:  Tommy Bailey, Tommy Bailey                     ACCOUNT NO.:  MEDICAL RECORD NO.:  [id number]  LOCATION:                                 FACILITY:  PHYSICIAN:  Vanita PandaChristopher Y. 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  PROCEDURE:  Revision arthroplasty, right total knee with revision of both femoral and tibial components.  FINDINGS:  Large joint effusion with no evidence of infection and aseptic loosening of mainly the tibial tray with micromotion of the femur.  IMPLANTS:  DePuy Revision knee system with size 4 TC3 cemented femur with 8-mm distal femoral augments medially and laterally and a 13 x 60- mm stem, size 4 MBT Revision tibial tray with bilateral 5-mm augmented wedges and a 13-mm x 30-mm modular stem, size 17.5 TC3 rotating platform tibial polyethylene insert.  SURGEON:  Vanita PandaChristopher Y. Magnus IvanBlackman, M.D.  ASSISTANT:  Tommy CanalGilbert Clark, PA-C.  ANESTHESIA: 1. Right lower extremity adductor canal block. 2. Spinal. 3. General via LMA.  ANTIBIOTICS:  2 g of IV Ancef.  BLOOD LOSS:  200 cc.  TOURNIQUET TIME:  2 hours and 16 minutes.  COMPLICATIONS:  None.  INDICATIONS:  [name] is a 72 year old gentleman who had a right total knee [place]  He had an unfortunate postoperative recovery in terms of developing arthrofibrosis postop with likely inability to deal with the pain of his knee replacement.  Over time, I feel that the emergent orthopedic surgeon did a great job in terms of the x-rays, looking like there an excellent alignment.  I believe manipulation was tried as well as an arthroscopic intervention.  The patient  definitely found his way to me and look for second opinion.  In the office, he did have a large joint effusion, we drained fluid from the knee and had minimal white blood cell counts and no organisms.  I did obtain a three-phase bone scan that showed significant uptake at the medial tibial plateau and this corresponded with a small dark area on the plain films.  There was also diffuse uptake around the femoral component.  With that being said, we recommended a revision arthroplasty.  He understands that we will likely be revising all components given his recurrent effusion in his poor motion, which of note, he does have full extension, but only flexes to about 60-70 degrees.  PROCEDURE DESCRIPTION:  After informed consent was obtained, appropriate right knee was marked.  An adductor canal block was obtained in the holding room and he was brought to the operating room where spinal anesthesia was obtained.  He was laid in a supine position on the operating table.  Foley catheter was placed.  A nonsterile tourniquet was placed on his upper right thigh and his right leg from the thigh down the toes, was prepped with DuraPrep and sterile drapes.  Time-out was called and he was identified as correct  patient and correct right knee.  We then made an incision over the patella and carried this proximally and distally, noticed that he was noticing and withdrawing to pain.  So, general anesthesia was then obtained via an LMA and then we proceeded with the case.  We dissected down the knee joint and carried out a medial parapatellar arthrotomy finding a significantly large joint effusion that was clear yellow fluid.  We did send the Gram stain and cultures off, this did not appear to be infected.  Once we got into the knee, we had to remove abundant scar tissue from throughout the knee. We then removed the polyethylene insert from the patella.  We then assessed the tibial tray and did find some  loosening medially.  Once that was said, we trying to get the tibial tray out and that was difficult with the femur component in place.  I then was able to start mobilizing tissue from around the femoral component and then after working on it, it came off relatively easy, but there was some bone ingrowth and good interface with the implant on the bone, on the femur, on the tibia though.  There was no interface of the cement between the tibial component and the tibia itself.  Once all components were removed, there was unfortunately a lot of bone left.  We then did a tibial freshening cut after using a temporary guide on the tibia.  We hand-reamed up to a 15 reamer for tibia stem and the same thing with the femur and hand-reamed up to a 15 femoral stem.  We freshened our femoral cut as well.  We then set the rotation again for the femur off the tibia and put our 4-in-1 cutting block for size 4 femur, and then our box cut and did those cuts and then placed a size 4 temporary tibial tray with a stem and a size 4 femur.  We then meticulously went back through flexion extension and added 5-mm wedges of the tibia and 8-mm distal femoral wedges to the femur so we get a tight flexion and extension gap.  We were able to do this with a 17.5 insert.  I surmised that even though we did not remove much significant amount of bone with releasing all scar tissue, it needed this.  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  Once we were pleased with the trial insert, we removed all trial inserts and then irrigated the knee with normal saline solution using pulsatile lavage.  I did let the tourniquet down at 2 hours and hemostasis was obtained with electrocautery.  Once the tourniquet had been down for 15 minutes, we let it back up and then mixed our cement.  We then cemented the real MBT tibial revision tray with the stem and its augments, we cemented the real femoral  component size 4 with its stem and distal femoral augments.  Once debridement cleaned from the knee, we placed the real 17.5 TC3 tibial rotating platform insert.  We then kept the knee in extended position until the cement had hardened, we then let the tourniquet down again and we irrigated the knee with normal saline solution using pulsatile lavage. We closed the joint capsule with interrupted #1 Vicryl suture followed by 0 Vicryl in the deep tissue, 2-0 Vicryl in the subcutaneous tissue, interrupted staples on the skin.  Xeroform and well-padded sterile dressings applied.  He was awakened, extubated and taken to the recovery room in stable condition.  All final counts were correct.  There were no complications noted.  Of note, Tommy Canal, PA-C, assisted in the entire case.  His assistance was crucial for facilitating all aspects of this case.    Vanita Panda. Magnus Ivan, M.D.    CYB/MEDQ  D:  06/16/2017  T:  06/16/2017  Job:  [id number]

## 2017-06-17 NOTE — Evaluation (Signed)
Physical Therapy Evaluation Patient Details Name: Tommy Bailey MRN: [id number] DOB: [birth year] Today's Date: 06/17/2017   History of Present Illness  This 72 yo male s/p Revision arthroplasty, right total knee with revision of both femoral and tibial components  Clinical Impression  Patient presents with decreased independence with mobility due to deficits listed in PT problem list.  Specifically this session limited due to some orthostatic symptoms with nausea and dizziness.  Feel he will benefit from skilled PT in the acute setting to allow d/c home with wife assist and follow up PT.    Follow Up Recommendations Follow surgeon's recommendation for DC plan and follow-up therapies    Equipment Recommendations  None recommended by PT    Recommendations for Other Services       Precautions / Restrictions Precautions Precautions: Fall;Knee Precaution Comments: watch BP Restrictions Weight Bearing Restrictions: Yes RLE Weight Bearing: Weight bearing as tolerated      Mobility  Bed Mobility Overal bed mobility: Needs Assistance Bed Mobility: Supine to Sit     Supine to sit: Min assist     General bed mobility comments: up walking in room with OT  Transfers Overall transfer level: Needs assistance Equipment used: Rolling walker (2 wheeled) Transfers: Sit to/from Stand Sit to Stand: Min guard         General transfer comment: to sit on recliner  Ambulation/Gait Ambulation/Gait assistance: Min guard Ambulation Distance (Feet): 60 Feet Assistive device: Rolling walker (2 wheeled) Gait Pattern/deviations: Step-to pattern;Antalgic;Decreased stride length     General Gait Details: limited tolerance due to light headed and nauseated, BP 108/53 after returning to sit  Stairs            Wheelchair Mobility    Modified Rankin (Stroke Patients Only)       Balance Overall balance assessment: Needs assistance Sitting-balance support: No upper extremity  supported;Feet supported Sitting balance-Leahy Scale: Good     Standing balance support: Bilateral upper extremity supported Standing balance-Leahy Scale: Poor Standing balance comment: UE support for balance                             Pertinent Vitals/Pain Pain Assessment: 0-10 Pain Score: 6  Pain Location: right knee Pain Descriptors / Indicators: Aching;Guarding;Sore Pain Intervention(s): Monitored during session;Repositioned    Home Living Family/patient expects to be discharged to:: Private residence Living Arrangements: Spouse/significant other Available Help at Discharge: Family;Available 24 hours/day Type of Home: House Home Access: Stairs to enter Entrance Stairs-Rails: (grabbar at top step) Secretary/administratorntrance Stairs-Number of Steps: 3 Home Layout: One level Home Equipment: Environmental consultantWalker - 2 wheels;Bedside commode;Adaptive equipment      Prior Function Level of Independence: Independent               Hand Dominance   Dominant Hand: Right    Extremity/Trunk Assessment   Upper Extremity Assessment Upper Extremity Assessment: Defer to OT evaluation    Lower Extremity Assessment Lower Extremity Assessment: RLE deficits/detail RLE Deficits / Details: edema at knee, strength 3-/5 AAROM flexion about 65, extension about 15       Communication   Communication: No difficulties  Cognition Arousal/Alertness: Awake/alert Behavior During Therapy: WFL for tasks assessed/performed Overall Cognitive Status: Within Functional Limits for tasks assessed  General Comments      Exercises Total Joint Exercises Ankle Circles/Pumps: AROM;10 reps;Seated;Both Quad Sets: AROM;10 reps;Right;Seated Short Arc Quad: AROM;10 reps;Right;Seated Heel Slides: AAROM;10 reps;Right;Seated Hip ABduction/ADduction: AROM;10 reps;Right;Seated Straight Leg Raises: AROM;10 reps;Right;Seated Goniometric ROM: 15-65 AAROM    Assessment/Plan    PT Assessment Patient needs continued PT services  PT Problem List Decreased range of motion;Decreased strength;Decreased mobility;Decreased knowledge of precautions;Decreased activity tolerance;Decreased balance;Decreased knowledge of use of DME;Pain       PT Treatment Interventions DME instruction;Therapeutic activities;Gait training;Therapeutic exercise;Patient/family education;Stair training;Functional mobility training;Balance training    PT Goals (Current goals can be found in the Care Plan section)  Acute Rehab PT Goals Patient Stated Goal: to get right knee bending better PT Goal Formulation: With patient Time For Goal Achievement: 06/24/17 Potential to Achieve Goals: Good    Frequency 7X/week   Barriers to discharge        Co-evaluation               AM-PAC PT "6 Clicks" Daily Activity  Outcome Measure Difficulty turning over in bed (including adjusting bedclothes, sheets and blankets)?: A Little Difficulty moving from lying on back to sitting on the side of the bed? : A Little Difficulty sitting down on and standing up from a chair with arms (e.g., wheelchair, bedside commode, etc,.)?: Unable Help needed moving to and from a bed to chair (including a wheelchair)?: A Little Help needed walking in hospital room?: A Little Help needed climbing 3-5 steps with a railing? : A Little 6 Click Score: 16    End of Session Equipment Utilized During Treatment: Gait belt Activity Tolerance: Patient limited by fatigue(nausea and dizzinesss) Patient left: with call bell/phone within reach;in chair Nurse Communication: Other (comment)(BP) PT Visit Diagnosis: Muscle weakness (generalized) (M62.81);Difficulty in walking, not elsewhere classified (R26.2);Pain Pain - Right/Left: Right Pain - part of body: Knee    Time: 6213-0865 PT Time Calculation (min) (ACUTE ONLY): 25 min   Charges:   PT Evaluation $PT Eval Low Complexity: 1 Low PT  Treatments $Gait Training: 8-22 mins   PT G CodesSheran Lawless, [place] [phone] 06/17/2017   Elray Mcgregor 06/17/2017, 12:50 PM

## 2017-06-17 NOTE — Progress Notes (Signed)
   Subjective: 1 Day Post-Op Procedure(s) (LRB): RIGHT TOTAL KNEE REVISION VS. LYSIS OF ADHESIONS AND POLY LINER EXCHANGE (Right) Patient reports pain as mild and moderate.  It hurts less than first surgery on my knee.   Objective: Vital signs in last 24 hours: Temp:  [97.6 F (36.4 C)-98.9 F (37.2 C)] 98.1 F (36.7 C) (03/16 0800) Pulse Rate:  [56-88] 77 (03/16 0800) Resp:  [8-24] 16 (03/16 0800) BP: (108-152)/(58-96) 122/68 (03/16 0800) SpO2:  [94 %-100 %] 97 % (03/16 0800)  Intake/Output from previous day: 03/15 0701 - 03/16 0700 In: 3297.5 [P.O.:240; I.V.:2952.5; IV Piggyback:105] Out: 1180 [Urine:980; Blood:200] Intake/Output this shift: No intake/output data recorded.  Recent Labs    06/17/17 0600  HGB 12.0*   Recent Labs    06/17/17 0600  WBC 10.6*  RBC 4.16*  HCT 38.4*  PLT 193   No results for input(s): NA, K, CL, CO2, BUN, CREATININE, GLUCOSE, CALCIUM in the last 72 hours. No results for input(s): LABPT, INR in the last 72 hours.  dressing dry.  Dg Knee Right Port  Result Date: 06/16/2017 CLINICAL DATA:  Right total knee arthroplasty revision. EXAM: PORTABLE RIGHT KNEE - 1-2 VIEW COMPARISON:  Right knee x-rays dated April 26, 2017. FINDINGS: The right knee demonstrates a total knee arthroplasty revision with new constrained arthroplasty. No evidence of hardware failure or complication. There is expected intra-articular air. There is no fracture or dislocation. The alignment is anatomic. Post-surgical changes noted in the surrounding soft tissues. IMPRESSION: 1. Interval right total knee arthroplasty revision without evidence of acute postoperative complication. Electronically Signed   By: Obie DredgeWilliam T Derry M.D.   On: 06/16/2017 16:45    Assessment/Plan: 1 Day Post-Op Procedure(s) (LRB): RIGHT TOTAL KNEE REVISION VS. LYSIS OF ADHESIONS AND POLY LINER EXCHANGE (Right) Up with therapy,  OR cultures neg so far.    Eldred MangesMark C Joas Motton 06/17/2017, 10:12 AM

## 2017-06-17 NOTE — Progress Notes (Signed)
Physical Therapy Treatment Patient Details Name: Tommy Bailey MRN: [id number] DOB: 11-19-1945 Today's Date: 06/17/2017    History of Present Illness This 72 yo male s/p Revision arthroplasty, right total knee with revision of both femoral and tibial components    PT Comments    POD # 1 pm session Assisted out of recliner to amb a limited distance due to fatigue.  Assisted back to bed and perfomed some TKR TE's followed by ICE.   Follow Up Recommendations  Follow surgeon's recommendation for DC plan and follow-up therapies     Equipment Recommendations  None recommended by PT    Recommendations for Other Services       Precautions / Restrictions Precautions Precautions: Fall;Knee Precaution Comments: watch BP Restrictions Weight Bearing Restrictions: No RLE Weight Bearing: Weight bearing as tolerated    Mobility  Bed Mobility Overal bed mobility: Needs Assistance Bed Mobility: Sit to Supine     Supine to sit: Min assist Sit to supine: Min assist   General bed mobility comments: assist R LE up onto bed  Transfers Overall transfer level: Needs assistance Equipment used: Rolling walker (2 wheeled) Transfers: Sit to/from Stand Sit to Stand: Min guard         General transfer comment: 25% VC's on proper hand placement and safety with turns  Ambulation/Gait Ambulation/Gait assistance: Min guard Ambulation Distance (Feet): 22 Feet Assistive device: Rolling walker (2 wheeled) Gait Pattern/deviations: Step-to pattern;Antalgic;Decreased stride length Gait velocity: decreased   General Gait Details: decreased amb distance due to increased c/o fatigue   Stairs            Wheelchair Mobility    Modified Rankin (Stroke Patients Only)       Balance Overall balance assessment: Needs assistance Sitting-balance support: No upper extremity supported;Feet supported Sitting balance-Leahy Scale: Good     Standing balance support: Bilateral upper  extremity supported Standing balance-Leahy Scale: Poor Standing balance comment: UE support for balance                            Cognition Arousal/Alertness: Awake/alert Behavior During Therapy: WFL for tasks assessed/performed Overall Cognitive Status: Within Functional Limits for tasks assessed                                        Exercises 10 reps AP, knee presses and AAROM HS 0 - approx 45 (increased tightness)   General Comments        Pertinent Vitals/Pain Pain Assessment: 0-10 Pain Score: 3  Pain Location: right knee Pain Descriptors / Indicators: Aching;Guarding;Sore;Operative site guarding Pain Intervention(s): Monitored during session;Repositioned;Premedicated before session;Ice applied    Home Living Family/patient expects to be discharged to:: Private residence Living Arrangements: Spouse/significant other Available Help at Discharge: Family;Available 24 hours/day Type of Home: House Home Access: Stairs to enter Entrance Stairs-Rails: (grabbar at top step) Home Layout: One level Home Equipment: Environmental consultant - 2 wheels;Bedside commode;Adaptive equipment      Prior Function Level of Independence: Independent          PT Goals (current goals can now be found in the care plan section) Acute Rehab PT Goals Patient Stated Goal: to get right knee bending better PT Goal Formulation: With patient Time For Goal Achievement: 06/24/17 Potential to Achieve Goals: Good Progress towards PT goals: Progressing toward goals    Frequency  7X/week      PT Plan Current plan remains appropriate    Co-evaluation              AM-PAC PT "6 Clicks" Daily Activity  Outcome Measure  Difficulty turning over in bed (including adjusting bedclothes, sheets and blankets)?: A Little Difficulty moving from lying on back to sitting on the side of the bed? : A Little Difficulty sitting down on and standing up from a chair with arms (e.g.,  wheelchair, bedside commode, etc,.)?: Unable Help needed moving to and from a bed to chair (including a wheelchair)?: A Little Help needed walking in hospital room?: A Little Help needed climbing 3-5 steps with a railing? : A Little 6 Click Score: 16    End of Session Equipment Utilized During Treatment: Gait belt Activity Tolerance: Patient limited by fatigue Patient left: in bed;with call bell/phone within reach Nurse Communication: Mobility status PT Visit Diagnosis: Muscle weakness (generalized) (M62.81);Difficulty in walking, not elsewhere classified (R26.2);Pain Pain - Right/Left: Right Pain - part of body: Knee     Time: 1300-1320 PT Time Calculation (min) (ACUTE ONLY): 20 min  Charges:  $Gait Training: 8-22 mins                    G Codes:       {Charlize Hathaway  PTA [facility]  Acute  Rehab Pager      [phone]

## 2017-06-18 LAB — CBC
HCT: 33.4 % — ABNORMAL LOW (ref 39.0–52.0)
Hemoglobin: 10.9 g/dL — ABNORMAL LOW (ref 13.0–17.0)
MCH: 29.9 pg (ref 26.0–34.0)
MCHC: 32.6 g/dL (ref 30.0–36.0)
MCV: 91.5 fL (ref 78.0–100.0)
Platelets: 151 10*3/uL (ref 150–400)
RBC: 3.65 MIL/uL — ABNORMAL LOW (ref 4.22–5.81)
RDW: 14.9 % (ref 11.5–15.5)
WBC: 10.3 10*3/uL (ref 4.0–10.5)

## 2017-06-18 MED ORDER — METHOCARBAMOL 500 MG PO TABS: 500.0000 mg | ORAL_TABLET | Freq: Four times a day (QID) | ORAL | 1 refills | Status: AC | PRN

## 2017-06-18 MED ORDER — RIVAROXABAN 10 MG PO TABS: 10.0000 mg | ORAL_TABLET | Freq: Every day | ORAL | 0 refills | Status: AC

## 2017-06-18 MED ORDER — OXYCODONE HCL 5 MG PO TABS: 5.0000 mg | ORAL_TABLET | ORAL | 0 refills | Status: DC | PRN

## 2017-06-18 NOTE — Progress Notes (Signed)
Confirmed with pt that he needs no new equipment. Maalik Pinn, [employer]

## 2017-06-18 NOTE — Discharge Instructions (Signed)

## 2017-06-18 NOTE — Discharge Summary (Signed)
Patient ID: Tommy Bailey MRN: [id number] DOB/AGE: 05-09-1945 72 y.o.  Admit date: 06/16/2017 Discharge date: 06/18/2017  Admission Diagnoses:  Principal Problem:   Loose total knee arthroplasty [facility]) Active Problems:   Status post revision of total knee, right   Discharge Diagnoses:  Same  Past Medical History:  Diagnosis Date  . Anxiety   . Arthritis   . Depression   . 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 GERD (gastroesophageal reflux disease)   . Hiatal hernia   . Muscle cramps    back and upper leg  . Plantar fasciitis   . SOB (shortness of breath)     Surgeries: Procedure(s): RIGHT TOTAL KNEE REVISION VS. LYSIS OF ADHESIONS AND POLY LINER EXCHANGE on 06/16/2017   Consultants:   Discharged Condition: Improved  Hospital Course: Tommy Bailey is an 72 y.o. male who was admitted 06/16/2017 for operative treatment ofLoose total knee arthroplasty (HCC). Patient has severe unremitting pain that affects sleep, daily activities, and work/hobbies. After pre-op clearance the patient was taken to the operating room on 06/16/2017 and underwent  Procedure(s): RIGHT TOTAL KNEE REVISION VS. LYSIS OF ADHESIONS AND POLY LINER EXCHANGE.    Patient was given perioperative antibiotics:  Anti-infectives (From admission, onward)   Start     Dose/Rate Route Frequency Ordered Stop   06/16/17 1830  ceFAZolin (ANCEF) IVPB 1 g/50 mL premix     1 g 100 mL/hr over 30 Minutes Intravenous Every 6 hours 06/16/17 1800 06/17/17 0045   06/16/17 1100  ceFAZolin (ANCEF) IVPB 2g/100 mL premix     2 g 200 mL/hr over 30 Minutes Intravenous On call to O.R. 06/16/17 7846 06/16/17 1231       Patient was given sequential compression devices, early ambulation, and chemoprophylaxis to prevent DVT.  Patient benefited maximally from hospital stay and there were no complications.    Recent vital signs:  Patient Vitals for the past 24 hrs:  BP Temp Temp src Pulse Resp SpO2  06/18/17 0452  124/70 99.5 F (37.5 C) Oral (!) 107 19 97 %  06/17/17 2110 - - - 98 - -  06/17/17 2100 (!) 158/71 97.8 F (36.6 C) Oral (!) 105 20 97 %  06/17/17 1823 120/69 98.2 F (36.8 C) Oral 89 20 95 %  06/17/17 1608 128/71 - - 86 - -  06/17/17 1207 (!) 119/55 97.8 F (36.6 C) Oral 81 20 92 %     Recent laboratory studies:  Recent Labs    06/17/17 0600 06/18/17 0534  WBC 10.6* 10.3  HGB 12.0* 10.9*  HCT 38.4* 33.4*  PLT 193 151  NA 138  --   K 5.1  --   CL 103  --   CO2 27  --   BUN 17  --   CREATININE 1.07  --   GLUCOSE 146*  --   CALCIUM 8.6*  --      Discharge Medications:   Allergies as of 06/18/2017      Reactions   Pseudoephedrine Hcl Itching   Tramadol Itching      Medication List    STOP taking these medications   ibuprofen 200 MG tablet Commonly known as:  ADVIL,MOTRIN     TAKE these medications   ALPRAZolam 0.5 MG tablet Commonly known as:  XANAX Take 0.5 mg by mouth daily as needed for anxiety.   ASPERCREME LIDOCAINE EX Apply 1 application topically daily as needed (muscle cramps).   gabapentin 300 MG  capsule Commonly known as:  NEURONTIN Take 300 mg by mouth at bedtime as needed (pain).   guaiFENesin 600 MG 12 hr tablet Commonly known as:  MUCINEX Take 600 mg by mouth 2 (two) times daily as needed for cough or to loosen phlegm.   Magnesium 500 MG Tabs Take 500 mg by mouth daily.   methocarbamol 500 MG tablet Commonly known as:  ROBAXIN Take 1 tablet (500 mg total) by mouth every 6 (six) hours as needed for muscle spasms.   omeprazole 20 MG capsule Commonly known as:  PRILOSEC Take 20 mg by mouth daily.   oxyCODONE 5 MG immediate release tablet Commonly known as:  Oxy IR/ROXICODONE Take 1-2 tablets (5-10 mg total) by mouth every 4 (four) hours as needed for moderate pain (pain score 4-6).   rivaroxaban 10 MG Tabs tablet Commonly known as:  XARELTO Take 1 tablet (10 mg total) by mouth daily with breakfast. Start taking on:  06/19/2017    SIMPLY SLEEP PO Take 50 mg by mouth at bedtime as needed (sleep).            Durable Medical Equipment  (From admission, onward)        Start     Ordered   06/16/17 1801  DME 3 n 1  Once     06/16/17 1800   06/16/17 1801  DME Walker rolling  Once    Question:  Patient needs a walker to treat with the following condition  Answer:  Status post revision of total knee, right   06/16/17 1800      Diagnostic Studies: Dg Knee Right Port  Result Date: 06/16/2017 CLINICAL DATA:  Right total knee arthroplasty revision. EXAM: PORTABLE RIGHT KNEE - 1-2 VIEW COMPARISON:  Right knee x-rays dated April 26, 2017. FINDINGS: The right knee demonstrates a total knee arthroplasty revision with new constrained arthroplasty. No evidence of hardware failure or complication. There is expected intra-articular air. There is no fracture or dislocation. The alignment is anatomic. Post-surgical changes noted in the surrounding soft tissues. IMPRESSION: 1. Interval right total knee arthroplasty revision without evidence of acute postoperative complication. Electronically Signed   By: Obie DredgeWilliam T Derry M.D.   On: 06/16/2017 16:45    Disposition:     Follow-up Information    Kathryne HitchBlackman, Christopher Y, MD. Schedule an appointment as soon as possible for a visit in 2 week(s).   Specialty:  Orthopedic Surgery Contact information: [address] [place] [address] [phone]            Signed: Richardean CanalGILBERT Aahana Elza 06/18/2017, 10:41 AM

## 2017-06-18 NOTE — Progress Notes (Signed)
Subjective: 2 Days Post-Op Procedure(s) (LRB): RIGHT TOTAL KNEE REVISION VS. LYSIS OF ADHESIONS AND POLY LINER EXCHANGE (Right) Patient reports pain as moderate.  Heart burn this morning not taking omeprazole like he takes at home. Wants to go home this afternoon.   Objective: Vital signs in last 24 hours: Temp:  [97.8 F (36.6 C)-99.5 F (37.5 C)] 99.5 F (37.5 C) (03/17 0452) Pulse Rate:  [81-107] 107 (03/17 0452) Resp:  [19-20] 19 (03/17 0452) BP: (119-158)/(55-71) 124/70 (03/17 0452) SpO2:  [92 %-97 %] 97 % (03/17 0452)  Intake/Output from previous day: 03/16 0701 - 03/17 0700 In: 2027 [P.O.:420; I.V.:1607] Out: 1975 [Urine:1975] Intake/Output this shift: Total I/O In: 240 [P.O.:240] Out: -   Recent Labs    06/17/17 0600 06/18/17 0534  HGB 12.0* 10.9*   Recent Labs    06/17/17 0600 06/18/17 0534  WBC 10.6* 10.3  RBC 4.16* 3.65*  HCT 38.4* 33.4*  PLT 193 151   Recent Labs    06/17/17 0600  NA 138  K 5.1  CL 103  CO2 27  BUN 17  CREATININE 1.07  GLUCOSE 146*  CALCIUM 8.6*   No results for input(s): LABPT, INR in the last 72 hours.  Right lower extremity: Intact pulses distally Dorsiflexion/Plantar flexion intact Incision: dressing C/D/I Compartment soft  Assessment/Plan: 2 Days Post-Op Procedure(s) (LRB): RIGHT TOTAL KNEE REVISION VS. LYSIS OF ADHESIONS AND POLY LINER EXCHANGE (Right) Up with therapy  Discharge home this afternoon   Tommy Bailey 06/18/2017, 10:36 AM

## 2017-06-18 NOTE — Progress Notes (Signed)
Physical Therapy Treatment Patient Details Name: Tommy Bailey MRN: [id number] DOB: 11/13/1945 Today's Date: 06/18/2017    History of Present Illness This 72 yo male s/p Revision arthroplasty, right total knee with revision of both femoral and tibial components    PT Comments    Progressing with mobility. Pain rated 7/10 with activity. Reviewed/practiced gait training and stair training. All education completed. Okay to d/c from PT standpoint.    Follow Up Recommendations  Follow surgeon's recommendation for DC plan and follow-up therapies     Equipment Recommendations  None recommended by PT    Recommendations for Other Services       Precautions / Restrictions Precautions Precautions: Knee;Fall Precaution Comments: watch BP Required Braces or Orthoses: Knee Immobilizer - Right Restrictions Weight Bearing Restrictions: No RLE Weight Bearing: Weight bearing as tolerated    Mobility  Bed Mobility Overal bed mobility: Needs Assistance           General bed mobility comments: oob in recliner  Transfers Overall transfer level: Needs assistance Equipment used: Rolling walker (2 wheeled) Transfers: Sit to/from Stand Sit to Stand: Min guard         General transfer comment: close guard for safety. VCS safety, hand/LE placement.   Ambulation/Gait Ambulation/Gait assistance: Min guard Ambulation Distance (Feet): 50 Feet Assistive device: Rolling walker (2 wheeled) Gait Pattern/deviations: Step-to pattern     General Gait Details: slow gait speed. close guard for safety. Pt denied dizziness/lightheadedness/fatigue.    Stairs Stairs: Yes Min assist Stair Management: Step to pattern;Two rails;Forwards Number of Stairs: 5 General stair comments: up and over portable steps. VCs safety, sequence, technique. Small amount of assist to steady.   Wheelchair Mobility    Modified Rankin (Stroke Patients Only)       Balance                                            Cognition Arousal/Alertness: Awake/alert Behavior During Therapy: WFL for tasks assessed/performed Overall Cognitive Status: Within Functional Limits for tasks assessed                                        Exercises Total Joint Exercises Ankle Circles/Pumps: AROM;10 reps;Seated;Both Quad Sets: AROM;10 reps;Right;Seated Heel Slides: AAROM;10 reps;Right;Seated Hip ABduction/ADduction: AROM;10 reps;Right;Seated Straight Leg Raises: AROM;10 reps;Right;Seated Goniometric ROM: ~10-40 degrees    General Comments        Pertinent Vitals/Pain Pain Assessment: 0-10 Pain Score: 7  Pain Location: R knee with activity Pain Descriptors / Indicators: Aching;Guarding;Sore;Operative site guarding Pain Intervention(s): Monitored during session;Repositioned;Ice applied    Home Living                      Prior Function            PT Goals (current goals can now be found in the care plan section) Progress towards PT goals: Progressing toward goals    Frequency    7X/week      PT Plan Current plan remains appropriate    Co-evaluation              AM-PAC PT "6 Clicks" Daily Activity  Outcome Measure  Difficulty turning over in bed (including adjusting bedclothes, sheets and blankets)?: A Little Difficulty moving from lying on back  to sitting on the side of the bed? : A Little Difficulty sitting down on and standing up from a chair with arms (e.g., wheelchair, bedside commode, etc,.)?: A Little Help needed moving to and from a bed to chair (including a wheelchair)?: A Little Help needed walking in hospital room?: A Little Help needed climbing 3-5 steps with a railing? : A Little 6 Click Score: 18    End of Session Equipment Utilized During Treatment: Gait belt Activity Tolerance: Patient tolerated treatment well Patient left: in chair;with call bell/phone within reach;with family/visitor present   PT Visit Diagnosis:  Muscle weakness (generalized) (M62.81);Difficulty in walking, not elsewhere classified (R26.2);Pain Pain - Right/Left: Right Pain - part of body: Knee     Time: [id number] PT Time Calculation (min) (ACUTE ONLY): 18 min  Charges:  $Gait Training: 8-22 mins $Therapeutic Exercise: 8-22 mins                    G Codes:          Rebeca AlertJannie Harlon Kutner, MPT Pager: [phone]

## 2017-06-18 NOTE — Progress Notes (Signed)
Discharged from floor via w/c for transport home by car. Belongings & wife with pt. No changes in assessment. Kerrie Timm  

## 2017-06-18 NOTE — Progress Notes (Signed)
Physical Therapy Treatment Patient Details Name: Tommy DownsWayne A Burtch MRN: [id number] DOB: 01/17/1946 Today's Date: 06/18/2017    History of Present Illness This 72 yo male s/p Revision arthroplasty, right total knee with revision of both femoral and tibial components    PT Comments    Progressing with mobility. Pt c/o 8/10 pain with activity. Will continue to progress activity as tolerated. Will need to practice stair negotiation prior to d/c.    Follow Up Recommendations  Follow surgeon's recommendation for DC plan and follow-up therapies     Equipment Recommendations  None recommended by PT    Recommendations for Other Services       Precautions / Restrictions Precautions Precautions: Knee;Fall Precaution Comments: watch BP Restrictions Weight Bearing Restrictions: No RLE Weight Bearing: Weight bearing as tolerated    Mobility  Bed Mobility Overal bed mobility: Needs Assistance Bed Mobility: Supine to Sit     Supine to sit: Min assist     General bed mobility comments: assist R   Transfers Overall transfer level: Needs assistance Equipment used: Rolling walker (2 wheeled) Transfers: Sit to/from Stand Sit to Stand: Min guard;From elevated surface         General transfer comment: close guard for safety. VCS safety, hand/LE placement.   Ambulation/Gait Ambulation/Gait assistance: Min guard Ambulation Distance (Feet): 75 Feet Assistive device: Rolling walker (2 wheeled) Gait Pattern/deviations: Step-to pattern     General Gait Details: slow gait speed. close guard for safety. Pt denied dizziness/lightheadedness/fatigue.    Stairs            Wheelchair Mobility    Modified Rankin (Stroke Patients Only)       Balance                                            Cognition Arousal/Alertness: Awake/alert Behavior During Therapy: WFL for tasks assessed/performed Overall Cognitive Status: Within Functional Limits for tasks  assessed                                        Exercises Total Joint Exercises Ankle Circles/Pumps: AROM;10 reps;Seated;Both Quad Sets: AROM;10 reps;Right;Seated Heel Slides: AAROM;10 reps;Right;Seated Hip ABduction/ADduction: AROM;10 reps;Right;Seated Straight Leg Raises: AROM;10 reps;Right;Seated Goniometric ROM: ~10-40 degrees    General Comments        Pertinent Vitals/Pain Pain Assessment: 0-10 Pain Score: 8  Pain Location: R knee with activity Pain Descriptors / Indicators: Aching;Guarding;Sore;Operative site guarding Pain Intervention(s): Monitored during session;Repositioned;Ice applied    Home Living                      Prior Function            PT Goals (current goals can now be found in the care plan section) Progress towards PT goals: Progressing toward goals    Frequency    7X/week      PT Plan Current plan remains appropriate    Co-evaluation              AM-PAC PT "6 Clicks" Daily Activity  Outcome Measure  Difficulty turning over in bed (including adjusting bedclothes, sheets and blankets)?: A Little Difficulty moving from lying on back to sitting on the side of the bed? : A Little Difficulty sitting down on and standing up  from a chair with arms (e.g., wheelchair, bedside commode, etc,.)?: A Little Help needed moving to and from a bed to chair (including a wheelchair)?: A Little Help needed walking in hospital room?: A Little Help needed climbing 3-5 steps with a railing? : A Little 6 Click Score: 18    End of Session Equipment Utilized During Treatment: Gait belt Activity Tolerance: Patient tolerated treatment well Patient left: in chair;with call bell/phone within reach   Pain - Right/Left: Right Pain - part of body: Knee     Time: 1610-9604 PT Time Calculation (min) (ACUTE ONLY): 25 min  Charges:  $Gait Training: 8-22 mins $Therapeutic Exercise: 8-22 mins                    G Codes:           Rebeca Alert, MPT Pager: [phone]

## 2017-06-18 NOTE — Care Management Note (Signed)
Case Management Note  Patient Details  Name: Tommy Bailey MRN: [id number] Date of Birth: 1946-01-07  Subjective/Objective:    S/p revision arthroplasty                 Action/Plan: [facility] pt [facility] Pt states he has RW and 3n1 bc at home. [facility] ([facility] by surgeon's office).  Expected Discharge Date:  06/18/17               Expected Discharge Plan:  Home w Home Health Services  In-House Referral:  NA  Discharge planning Services  CM Consult  Post Acute Care Choice:  Home Health Choice offered to:  Patient  DME Arranged:  N/A DME Agency:  NA  HH Arranged:  PT HH Agency:  [facility] ([facility])  Status of Service:  Completed, signed off  If discussed at [employer] Meetings, dates discussed:    Additional Comments:  Elliot CousinShavis, Alexyss Balzarini Ellen, RN 06/18/2017, 1:33 PM

## 2017-06-19 ENCOUNTER — Encounter (HOSPITAL_COMMUNITY): Payer: Self-pay | Admitting: Orthopaedic Surgery

## 2017-06-19 LAB — AEROBIC CULTURE  (SUPERFICIAL SPECIMEN): CULTURE: NO GROWTH

## 2017-06-19 LAB — AEROBIC CULTURE W GRAM STAIN (SUPERFICIAL SPECIMEN)

## 2017-06-21 LAB — ANAEROBIC CULTURE

## 2017-06-27 ENCOUNTER — Telehealth (INDEPENDENT_AMBULATORY_CARE_PROVIDER_SITE_OTHER): Payer: Self-pay

## 2017-06-27 NOTE — Telephone Encounter (Signed)
Patient aware he can try an enema

## 2017-06-27 NOTE — Telephone Encounter (Signed)
Patient called stating that he has not had a normal bowel movement since having right total knee revision on 06/16/17.  Stated that his PT told him to try some magnesium and that did help, but would like to know what can be done or would he need a Rx?  CB# is [phone].  Please advise.  Thank You.

## 2017-07-03 ENCOUNTER — Encounter (INDEPENDENT_AMBULATORY_CARE_PROVIDER_SITE_OTHER): Payer: Self-pay | Admitting: Orthopaedic Surgery

## 2017-07-03 ENCOUNTER — Ambulatory Visit (INDEPENDENT_AMBULATORY_CARE_PROVIDER_SITE_OTHER): Payer: Medicare Other | Admitting: Orthopaedic Surgery

## 2017-07-03 DIAGNOSIS — Z96651 Presence of right artificial knee joint: Secondary | ICD-10-CM

## 2017-07-03 MED ORDER — OXYCODONE HCL 5 MG PO TABS: 5.0000 mg | ORAL_TABLET | ORAL | 0 refills | Status: AC | PRN

## 2017-07-03 NOTE — Progress Notes (Signed)
Patient is now 2 weeks status post a right total knee revision arthroplasty.  He has loosening of components that were done elsewhere.  He is doing well.  He is ready to get an outpatient physical therapy.  On exam he can flex his knee to 90 degrees.  His incisions well-healed and staples were removed and Steri-Strips applied.  His calf is soft.  The knee feels ligamentously stable.  I did refill his oxycodone today.  He will stop his Xarelto.  He can then start 2 Aleve twice a day for the next few weeks with food.  He is already on gabapentin at bedtime and he can increase this to 2-3 times a day if he needs it.  We will see him back in 4 weeks to see how is doing overall but no x-rays are needed.

## 2017-07-04 ENCOUNTER — Encounter: Payer: Self-pay | Admitting: Physical Therapy

## 2017-07-04 ENCOUNTER — Ambulatory Visit: Payer: Medicare Other | Attending: Orthopaedic Surgery | Admitting: Physical Therapy

## 2017-07-04 DIAGNOSIS — M25661 Stiffness of right knee, not elsewhere classified: Secondary | ICD-10-CM | POA: Insufficient documentation

## 2017-07-04 DIAGNOSIS — R600 Localized edema: Secondary | ICD-10-CM | POA: Diagnosis present

## 2017-07-04 DIAGNOSIS — M25561 Pain in right knee: Secondary | ICD-10-CM | POA: Diagnosis not present

## 2017-07-04 DIAGNOSIS — R262 Difficulty in walking, not elsewhere classified: Secondary | ICD-10-CM | POA: Insufficient documentation

## 2017-07-04 NOTE — Therapy (Signed)
[facility] [place] [facility] [address] [place], [place], [address] Phone: [phone]   Fax:  [phone]  Physical Therapy Evaluation  Patient Details  Name: Tommy Bailey MRN: [id number] Date of Birth: Oct 11, 1945 Referring Provider: Magnus Ivan   Encounter Date: 07/04/2017  PT End of Session - 07/04/17 1430    Visit Number  1    Date for PT Re-Evaluation  09/03/17    PT Start Time  1355    PT Stop Time  1455    PT Time Calculation (min)  60 min    Activity Tolerance  Patient tolerated treatment well    Behavior During Therapy  [facility] for tasks assessed/performed       Past Medical History:  Diagnosis Date  . Anxiety   . Arthritis   . Depression   . 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 GERD (gastroesophageal reflux disease)   . Hiatal hernia   . Muscle cramps    back and upper leg  . Plantar fasciitis   . SOB (shortness of breath)     Past Surgical History:  Procedure Laterality Date  . COLONOSCOPY    . SHOULDER SURGERY  2008   both  . TOTAL KNEE ARTHROPLASTY Right 2015  . TOTAL KNEE REVISION Right 06/16/2017   Procedure: RIGHT TOTAL KNEE REVISION VS. LYSIS OF ADHESIONS AND POLY LINER EXCHANGE;  Surgeon: Kathryne Hitch, MD;  Location: [facility];  Service: Orthopedics;  Laterality: Right;  with block   . UPPER GI ENDOSCOPY    . VENTRAL HERNIA REPAIR  09/2010   with mesh    There were no vitals filed for this visit.   Subjective Assessment - 07/04/17 1358    Subjective  Pateint underwent a right TKR 2015, reports that he just never got better, always painful and stiff.  He reports that a few weeks after the surgery he had a manipulation.  He underwent a revision March 15th.  He had 2 weeks of PT at home.  Saw MD yesterday and was pleased with his motion.    Patient Stated Goals  have better motions, less pain, walk better    Currently in Pain?  Yes    Pain Score  5     Pain Location  Knee     Pain Orientation  Right    Pain Descriptors / Indicators  Aching    Pain Type  Acute pain;Surgical pain    Pain Onset  1 to 4 weeks ago    Pain Frequency  Constant    Aggravating Factors   bending, walking, pain can be up to 10/10    Pain Relieving Factors  ice, pain meds and rest pain can be a 3/10    Effect of Pain on Daily Activities  limits everything         [facility] PT Assessment - 07/04/17 0001      Assessment   Medical Diagnosis  s/p right TKR revision    Referring Provider  Magnus Ivan    Onset Date/Surgical Date  06/16/17    Prior Therapy  at home      Precautions   Precautions  None      Balance Screen   Has the patient fallen in the past 6 months  No    Has the patient had a decrease in activity level because of a fear of falling?   No    Is the patient reluctant to  leave their home because of a fear of falling?   No      Home Environment   Additional Comments  3 steps into the home, was doing some housework and yardwork before knee issues      Prior Function   Level of Independence  Independent    Vocation  Retired    Leisure  no exercise      Observation/Other Assessments-Edema    Edema  Circumferential      Circumferential Edema   Circumferential - Right  49.5cm mid patella    Circumferential - Left   43 cm      ROM / Strength   AROM / PROM / Strength  AROM;PROM;Strength      AROM   Overall AROM Comments  very painful with flexion    AROM Assessment Site  Knee    Right/Left Knee  Right    Right Knee Extension  23    Right Knee Flexion  75      PROM   Overall PROM Comments  very painful with flexion    PROM Assessment Site  Knee    Right/Left Knee  Right    Right Knee Extension  10    Right Knee Flexion  81      Strength   Overall Strength Comments  4-/5 with pain      Palpation   Palpation comment  ballotable patella, mild warmth, steristrips in place      Ambulation/Gait   Gait Comments  uses SPC, slow, stiff leg, stairs one at a time       Standardized Balance Assessment   Standardized Balance Assessment  Timed Up and Go Test      Timed Up and Go Test   Normal TUG (seconds)  21                Objective measurements completed on examination: See above findings.      [facility] Adult PT Treatment/Exercise - 07/04/17 0001      Exercises   Exercises  Knee/Hip      Knee/Hip Exercises: Aerobic   Nustep  level 4 x 5 minutew      Modalities   Modalities  Vasopneumatic      Vasopneumatic   Number Minutes Vasopneumatic   15 minutes    Vasopnuematic Location   Knee    Vasopneumatic Pressure  Medium    Vasopneumatic Temperature   37             PT Education - 07/04/17 1429    Education provided  Yes    Education Details  LLLD stretches, flexion and extension    Person(s) Educated  Patient    Methods  Explanation;Demonstration;Handout;Verbal cues    Comprehension  Returned demonstration;Verbalized understanding       PT Short Term Goals - 07/04/17 1433      PT SHORT TERM GOAL #1   Title  independent with initial HEP    Time  2    Period  Weeks    Status  New        PT Long Term Goals - 07/04/17 1433      PT LONG TERM GOAL #1   Title  independent with RICE    Time  8    Period  Weeks    Status  New      PT LONG TERM GOAL #2   Title  walk without devie with minimal deviation    Time  8  Period  Weeks    Status  New      PT LONG TERM GOAL #3   Title  go up and down stairs reciprocally    Time  8    Period  Weeks    Status  New      PT LONG TERM GOAL #4   Title  increase AROM to 10-110 degrees flexion    Time  8    Period  Weeks    Status  New      PT LONG TERM GOAL #5   Title  increase strength to 5/5    Time  8    Period  Weeks    Status  New             Plan - 07/04/17 1430    Clinical Impression Statement  Patient had a right TKR in 2015, he reports complications from the start with an infection and then a manipulation.  He reports that pain and stiffness  continued and never got better, he reports that he underwent a right TKR revision on 06/16/17.  He is stiff with a lor of swelling.  AROM was 20-75 degrees flexion, pain with flexion, walks with a SPC, slow and stiff    Clinical Presentation  Evolving    Clinical Decision Making  Low    Rehab Potential  Good    PT Frequency  3x / week    PT Duration  8 weeks    PT Treatment/Interventions  ADLs/Self Care Home Management;Cryotherapy;Electrical Stimulation;Gait training;Balance training;Therapeutic exercise;Therapeutic activities;Functional mobility training;Stair training;Patient/family education;Manual techniques;Vasopneumatic Device    PT Next Visit Plan  get him walking normally and work on ROM    Consulted and Agree with Plan of Care  Patient       Patient will benefit from skilled therapeutic intervention in order to improve the following deficits and impairments:  Abnormal gait, Decreased range of motion, Difficulty walking, Increased muscle spasms, Cardiopulmonary status limiting activity, Decreased activity tolerance, Pain, Decreased balance, Impaired flexibility, Increased edema, Decreased strength, Decreased mobility  Visit Diagnosis: Acute pain of right knee - Plan: PT plan of care cert/re-cert  Stiffness of right knee, not elsewhere classified - Plan: PT plan of care cert/re-cert  Localized edema - Plan: PT plan of care cert/re-cert  Difficulty in walking, not elsewhere classified - Plan: PT plan of care cert/re-cert     Problem List Patient Active Problem List   Diagnosis Date Noted  . Status post revision of total knee, right 06/16/2017  . 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 Presence of right artificial knee joint 05/10/2017  . Chronic pain of right knee 05/10/2017  . Plantar fasciitis of right foot 01/26/2015  . Equinus deformity of foot, acquired 01/26/2015  . Rectus diastasis 09/22/2011    Jearld Lesch., PT 07/04/2017, 2:36 PM  [facility] [place] [facility] [address] [place], [place], [address] Phone: [phone]   Fax:  [phone]  Name: Tommy Bailey MRN: [id number] Date of Birth: 1945-05-19

## 2017-07-06 ENCOUNTER — Encounter: Payer: Self-pay | Admitting: Physical Therapy

## 2017-07-06 ENCOUNTER — Ambulatory Visit: Payer: Medicare Other | Admitting: Physical Therapy

## 2017-07-06 DIAGNOSIS — M25661 Stiffness of right knee, not elsewhere classified: Secondary | ICD-10-CM

## 2017-07-06 DIAGNOSIS — M25561 Pain in right knee: Secondary | ICD-10-CM | POA: Diagnosis not present

## 2017-07-06 DIAGNOSIS — R262 Difficulty in walking, not elsewhere classified: Secondary | ICD-10-CM

## 2017-07-06 DIAGNOSIS — R600 Localized edema: Secondary | ICD-10-CM

## 2017-07-06 NOTE — Therapy (Signed)
[facility] [address] [place], [place], [address] Phone: [phone]   Fax:  [phone]  Physical Therapy Treatment  Patient Details  Name: Tommy Bailey MRN: [id number] Date of Birth: 03-May-1945 Referring Provider: Magnus Bailey   Encounter Date: 07/06/2017  PT End of Session - 07/06/17 1532    Visit Number  2    Date for PT Re-Evaluation  09/03/17    PT Start Time  1400    PT Stop Time  1445    PT Time Calculation (min)  45 min    Activity Tolerance  Patient tolerated treatment well    Behavior During Therapy  [facility] for tasks assessed/performed       Past Medical History:  Diagnosis Date  . Anxiety   . Arthritis   . Depression   . 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 GERD (gastroesophageal reflux disease)   . Hiatal hernia   . Muscle cramps    back and upper leg  . Plantar fasciitis   . SOB (shortness of breath)     Past Surgical History:  Procedure Laterality Date  . COLONOSCOPY    . SHOULDER SURGERY  2008   both  . TOTAL KNEE ARTHROPLASTY Right 2015  . TOTAL KNEE REVISION Right 06/16/2017   Procedure: RIGHT TOTAL KNEE REVISION VS. LYSIS OF ADHESIONS AND POLY LINER EXCHANGE;  Surgeon: Tommy HitchBlackman, Christopher Y, MD;  Location: [facility];  Service: Orthopedics;  Laterality: Right;  with block   . UPPER GI ENDOSCOPY    . VENTRAL HERNIA REPAIR  09/2010   with mesh    There were no vitals filed for this visit.  Subjective Assessment - 07/06/17 1356    Subjective  Patient reports that he did well a little sore, reports pain in the medial right knee last night that was really bad    Currently in Pain?  Yes    Pain Score  5     Pain Location  Knee    Pain Orientation  Right;Medial    Pain Descriptors / Indicators  Aching                       [facility] Adult PT Treatment/Exercise - 07/06/17 0001      Knee/Hip Exercises: Aerobic   Nustep  level 4 x 6 minutew      Knee/Hip  Exercises: Machines for Strengthening   Cybex Knee Flexion  20# 2x10      Knee/Hip Exercises: Standing   Step Down  Step Height: 4";10 reps;2 sets      Manual Therapy   Manual Therapy  Passive ROM;Soft tissue mobilization    Soft tissue mobilization  to scar and medial knee and superior patella area    Passive ROM  focus on flexion some contract relax               PT Short Term Goals - 07/04/17 1433      PT SHORT TERM GOAL #1   Title  independent with initial HEP    Time  2    Period  Weeks    Status  New        PT Long Term Goals - 07/04/17 1433      PT LONG TERM GOAL #1   Title  independent with RICE    Time  8    Period  Weeks    Status  New  PT LONG TERM GOAL #2   Title  walk without devie with minimal deviation    Time  8    Period  Weeks    Status  New      PT LONG TERM GOAL #3   Title  go up and down stairs reciprocally    Time  8    Period  Weeks    Status  New      PT LONG TERM GOAL #4   Title  increase AROM to 10-110 degrees flexion    Time  8    Period  Weeks    Status  New      PT LONG TERM GOAL #5   Title  increase strength to 5/5    Time  8    Period  Weeks    Status  New            Plan - 07/06/17 1532    Clinical Impression Statement  Very tight and pain in the medial knee, needs cues to get TKE and to work on ROM, tends to compensate with hip to limit pain    PT Next Visit Plan  get him walking normally and work on ROM    Consulted and Agree with Plan of Care  Patient       Patient will benefit from skilled therapeutic intervention in order to improve the following deficits and impairments:  Abnormal gait, Decreased range of motion, Difficulty walking, Increased muscle spasms, Cardiopulmonary status limiting activity, Decreased activity tolerance, Pain, Decreased balance, Impaired flexibility, Increased edema, Decreased strength, Decreased mobility  Visit Diagnosis: Acute pain of right knee  Stiffness of right  knee, not elsewhere classified  Localized edema  Difficulty in walking, not elsewhere classified     Problem List Patient Active Problem List   Diagnosis Date Noted  . Status post revision of total knee, right 06/16/2017  . 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 Presence of right artificial knee joint 05/10/2017  . Chronic pain of right knee 05/10/2017  . Plantar fasciitis of right foot 01/26/2015  . Equinus deformity of foot, acquired 01/26/2015  . Rectus diastasis 09/22/2011    Jearld Lesch., PT 07/06/2017, 3:33 PM  [facility] [place] [facility] [address] [place], [place], [address] Phone: [phone]   Fax:  [phone]  Name: Tommy Bailey MRN: [id number] Date of Birth: 1945-09-25

## 2017-07-11 ENCOUNTER — Ambulatory Visit: Payer: Medicare Other | Admitting: Physical Therapy

## 2017-07-11 ENCOUNTER — Encounter: Payer: Self-pay | Admitting: Physical Therapy

## 2017-07-11 DIAGNOSIS — M25661 Stiffness of right knee, not elsewhere classified: Secondary | ICD-10-CM

## 2017-07-11 DIAGNOSIS — R262 Difficulty in walking, not elsewhere classified: Secondary | ICD-10-CM

## 2017-07-11 DIAGNOSIS — M25561 Pain in right knee: Secondary | ICD-10-CM | POA: Diagnosis not present

## 2017-07-11 DIAGNOSIS — R600 Localized edema: Secondary | ICD-10-CM

## 2017-07-11 NOTE — Therapy (Signed)
[facility] [address] [place], [place], [address] Phone: [phone]   Fax:  [phone]  Physical Therapy Treatment  Patient Details  Name: Tommy Bailey MRN: [id number] Date of Birth: 1945/10/02 Referring Provider: Magnus IvanBlackman   Encounter Date: 07/11/2017  PT End of Session - 07/11/17 1648    Visit Number  3    Date for PT Re-Evaluation  09/03/17    PT Start Time  1600    PT Stop Time  1658    PT Time Calculation (min)  58 min    Activity Tolerance  Patient tolerated treatment well    Behavior During Therapy  [facility] for tasks assessed/performed       Past Medical History:  Diagnosis Date  . Anxiety   . Arthritis   . Depression   . 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 GERD (gastroesophageal reflux disease)   . Hiatal hernia   . Muscle cramps    back and upper leg  . Plantar fasciitis   . SOB (shortness of breath)     Past Surgical History:  Procedure Laterality Date  . COLONOSCOPY    . SHOULDER SURGERY  2008   both  . TOTAL KNEE ARTHROPLASTY Right 2015  . TOTAL KNEE REVISION Right 06/16/2017   Procedure: RIGHT TOTAL KNEE REVISION VS. LYSIS OF ADHESIONS AND POLY LINER EXCHANGE;  Surgeon: Kathryne HitchBlackman, Christopher Y, MD;  Location: [facility];  Service: Orthopedics;  Laterality: Right;  with block   . UPPER GI ENDOSCOPY    . VENTRAL HERNIA REPAIR  09/2010   with mesh    There were no vitals filed for this visit.  Subjective Assessment - 07/11/17 1606    Subjective  "Overall Im ok, the knee tightens up on me, cramps up at night"    Currently in Pain?  Yes    Pain Score  6     Pain Location  Knee    Pain Orientation  Right                       [facility] Adult PT Treatment/Exercise - 07/11/17 0001      Knee/Hip Exercises: Aerobic   Nustep  level 4 x 6 minutes      Knee/Hip Exercises: Machines for Strengthening   Cybex Knee Flexion  20# 2x10 progressive lowering       Knee/Hip Exercises: Standing   Step Down  Step Height: 4";10 reps;2 sets;Hand Hold: 2    Other Standing Knee Exercises  4in toe clears tactile cues on hips RLE x10       Knee/Hip Exercises: Seated   Long Arc Quad  Right;2 sets;10 reps;Weights    Long Arc Quad Weight  3 lbs.      Modalities   Modalities  Vasopneumatic      Vasopneumatic   Number Minutes Vasopneumatic   15 minutes    Vasopnuematic Location   Knee    Vasopneumatic Pressure  Medium    Vasopneumatic Temperature   37      Manual Therapy   Manual Therapy  Passive ROM;Soft tissue mobilization    Passive ROM  R kne  flexion and extension               PT Short Term Goals - 07/04/17 1433      PT SHORT TERM GOAL #1   Title  independent with initial HEP    Time  2    Period  Weeks    Status  New        PT Long Term Goals - 07/04/17 1433      PT LONG TERM GOAL #1   Title  independent with RICE    Time  8    Period  Weeks    Status  New      PT LONG TERM GOAL #2   Title  walk without devie with minimal deviation    Time  8    Period  Weeks    Status  New      PT LONG TERM GOAL #3   Title  go up and down stairs reciprocally    Time  8    Period  Weeks    Status  New      PT LONG TERM GOAL #4   Title  increase AROM to 10-110 degrees flexion    Time  8    Period  Weeks    Status  New      PT LONG TERM GOAL #5   Title  increase strength to 5/5    Time  8    Period  Weeks    Status  New            Plan - 07/11/17 1648    Clinical Impression Statement  Increase swelling and stiffness around the right knee.hip compensation notes with toe clears and step Bailey. Tactile cue to prevent hip compensation with to clears made it more difficult for pt. Cues to perform TKE during LAQ to gain greater motion.     Rehab Potential  Good    PT Frequency  3x / week    PT Duration  8 weeks    PT Treatment/Interventions  ADLs/Self Care Home Management;Cryotherapy;Electrical Stimulation;Gait  training;Balance training;Therapeutic exercise;Therapeutic activities;Functional mobility training;Stair training;Patient/family education;Manual techniques;Vasopneumatic Device    PT Next Visit Plan  get him walking normally and work on ROM       Patient will benefit from skilled therapeutic intervention in order to improve the following deficits and impairments:  Abnormal gait, Decreased range of motion, Difficulty walking, Increased muscle spasms, Cardiopulmonary status limiting activity, Decreased activity tolerance, Pain, Decreased balance, Impaired flexibility, Increased edema, Decreased strength, Decreased mobility  Visit Diagnosis: Acute pain of right knee  Stiffness of right knee, not elsewhere classified  Localized edema  Difficulty in walking, not elsewhere classified     Problem List Patient Active Problem List   Diagnosis Date Noted  . Status post revision of total knee, right 06/16/2017  . 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 Presence of right artificial knee joint 05/10/2017  . Chronic pain of right knee 05/10/2017  . Plantar fasciitis of right foot 01/26/2015  . Equinus deformity of foot, acquired 01/26/2015  . Rectus diastasis 09/22/2011    Grayce Sessions, PTA 07/11/2017, 4:50 PM  [facility] [place] [facility] [address] [place], [place], [address] Phone: [phone]   Fax:  [phone]  Name: Tommy Bailey MRN: [id number] Date of Birth: Apr 19, 1945

## 2017-07-13 ENCOUNTER — Encounter: Payer: Self-pay | Admitting: Physical Therapy

## 2017-07-13 ENCOUNTER — Ambulatory Visit: Payer: Medicare Other | Admitting: Physical Therapy

## 2017-07-13 DIAGNOSIS — M25561 Pain in right knee: Secondary | ICD-10-CM | POA: Diagnosis not present

## 2017-07-13 DIAGNOSIS — R262 Difficulty in walking, not elsewhere classified: Secondary | ICD-10-CM

## 2017-07-13 DIAGNOSIS — M25661 Stiffness of right knee, not elsewhere classified: Secondary | ICD-10-CM

## 2017-07-13 DIAGNOSIS — R600 Localized edema: Secondary | ICD-10-CM

## 2017-07-14 NOTE — Therapy (Signed)
[facility] [place] [facility] [address] [place], [place], [address] Phone: [phone]   Fax:  [phone]  Physical Therapy Treatment  Patient Details  Name: Tommy Bailey MRN: [id number] Date of Birth: [birth year] Referring Provider: Magnus Ivan   Encounter Date: 07/13/2017  PT End of Session - 07/13/17 1645    Visit Number  4    Date for PT Re-Evaluation  09/03/17    PT Start Time  1600    PT Stop Time  1655    PT Time Calculation (min)  55 min    Activity Tolerance  Patient tolerated treatment well    Behavior During Therapy  [facility] for tasks assessed/performed       Past Medical History:  Diagnosis Date  . Anxiety   . Arthritis   . Depression   . 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 GERD (gastroesophageal reflux disease)   . Hiatal hernia   . Muscle cramps    back and upper leg  . Plantar fasciitis   . SOB (shortness of breath)     Past Surgical History:  Procedure Laterality Date  . COLONOSCOPY    . SHOULDER SURGERY  2008   both  . TOTAL KNEE ARTHROPLASTY Right 2015  . TOTAL KNEE REVISION Right 06/16/2017   Procedure: RIGHT TOTAL KNEE REVISION VS. LYSIS OF ADHESIONS AND POLY LINER EXCHANGE;  Surgeon: Kathryne Hitch, MD;  Location: [facility];  Service: Orthopedics;  Laterality: Right;  with block   . UPPER GI ENDOSCOPY    . VENTRAL HERNIA REPAIR  09/2010   with mesh    There were no vitals filed for this visit.  Subjective Assessment - 07/13/17 1558    Subjective  Pt reports that he has some muscle aching yesterday     Currently in Pain?  Yes    Pain Score  6     Pain Location  Knee    Pain Orientation  Right                                 PT Short Term Goals - 07/14/17 1031      PT SHORT TERM GOAL #1   Title  independent with initial HEP    Status  Achieved        PT Long Term Goals - 07/14/17 1031      PT LONG TERM GOAL #1   Title  independent  with RICE    Status  Achieved      PT LONG TERM GOAL #2   Title  walk without devie with minimal deviation    Status  On-going      PT LONG TERM GOAL #3   Title  go up and down stairs reciprocally    Status  On-going      PT LONG TERM GOAL #5   Title  increase strength to 5/5    Status  On-going            Plan - 07/13/17 1713    Clinical Impression Statement  R knee reminds very tight and stiff. Ballotable R patella noted. Pt able to complete all of today's exercises. Hip compensation noted with toe clears. Postural cue needed with standing alternating march.     Rehab Potential  Good    PT Frequency  3x / week    PT Duration  8  weeks    PT Treatment/Interventions  ADLs/Self Care Home Management;Cryotherapy;Electrical Stimulation;Gait training;Balance training;Therapeutic exercise;Therapeutic activities;Functional mobility training;Stair training;Patient/family education;Manual techniques;Vasopneumatic Device    PT Next Visit Plan  get him walking normally and work on ROM       Patient will benefit from skilled therapeutic intervention in order to improve the following deficits and impairments:  Abnormal gait, Decreased range of motion, Difficulty walking, Increased muscle spasms, Cardiopulmonary status limiting activity, Decreased activity tolerance, Pain, Decreased balance, Impaired flexibility, Increased edema, Decreased strength, Decreased mobility  Visit Diagnosis: Acute pain of right knee  Stiffness of right knee, not elsewhere classified  Localized edema  Difficulty in walking, not elsewhere classified     Problem List Patient Active Problem List   Diagnosis Date Noted  . Status post revision of total knee, right 06/16/2017  . 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 Presence of right artificial knee joint 05/10/2017  . Chronic pain of right knee 05/10/2017  . Plantar fasciitis of right foot 01/26/2015  . Equinus deformity of foot, acquired  01/26/2015  . Rectus diastasis 09/22/2011    Tommy Bailey, PTA 07/14/2017, 10:31 AM  [facility] [address] [place], [place], [address] Phone: [phone]   Fax:  [phone]  Name: Tommy Bailey MRN: [id number] Date of Birth: July 11, 1945

## 2017-07-17 ENCOUNTER — Ambulatory Visit: Payer: Medicare Other | Admitting: Physical Therapy

## 2017-07-17 ENCOUNTER — Encounter (HOSPITAL_COMMUNITY): Payer: Self-pay | Admitting: Orthopaedic Surgery

## 2017-07-17 DIAGNOSIS — R262 Difficulty in walking, not elsewhere classified: Secondary | ICD-10-CM

## 2017-07-17 DIAGNOSIS — M25661 Stiffness of right knee, not elsewhere classified: Secondary | ICD-10-CM

## 2017-07-17 DIAGNOSIS — R600 Localized edema: Secondary | ICD-10-CM

## 2017-07-17 DIAGNOSIS — M25561 Pain in right knee: Secondary | ICD-10-CM

## 2017-07-17 NOTE — Therapy (Signed)
[facility] [address] [place], [place], [address] Phone: [phone]   Fax:  [phone]  Physical Therapy Treatment  Patient Details  Name: Tommy DownsWayne A Marconi MRN: [id number] Date of Birth: September 03, 1945 Referring Provider: Magnus IvanBlackman   Encounter Date: 07/17/2017  PT End of Session - 07/17/17 1648    Visit Number  5    Date for PT Re-Evaluation  09/03/17    PT Start Time  1558    PT Stop Time  1702    PT Time Calculation (min)  64 min    Activity Tolerance  Patient tolerated treatment well    Behavior During Therapy  [facility] for tasks assessed/performed       Past Medical History:  Diagnosis Date  . Anxiety   . Arthritis   . Depression   . 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 GERD (gastroesophageal reflux disease)   . Hiatal hernia   . Muscle cramps    back and upper leg  . Plantar fasciitis   . SOB (shortness of breath)     Past Surgical History:  Procedure Laterality Date  . COLONOSCOPY    . SHOULDER SURGERY  2008   both  . TOTAL KNEE ARTHROPLASTY Right 2015  . TOTAL KNEE REVISION Right 06/16/2017   Procedure: RIGHT TOTAL KNEE REVISION VS. LYSIS OF ADHESIONS AND POLY LINER EXCHANGE;  Surgeon: Kathryne HitchBlackman, Christopher Y, MD;  [facility]: [facility];  Service: Orthopedics;  Laterality: Right;  with block   . UPPER GI ENDOSCOPY    . VENTRAL HERNIA REPAIR  09/2010   with mesh    There were no vitals filed for this visit.  Subjective Assessment - 07/17/17 1553    Subjective  "Just cramping and aching"    Currently in Pain?  Yes    Pain Score  8     Pain Location  Knee    Pain Orientation  Right         [facility] PT Assessment - 07/17/17 0001      AROM   AROM Assessment Site  Knee    Right/Left Knee  Right    Right Knee Extension  11    Right Knee Flexion  72                   [facility] Adult PT Treatment/Exercise - 07/17/17 0001      Knee/Hip Exercises: Aerobic   Recumbent Bike   Partial rev x4 min     Nustep  level 4 x 6 minutes      Knee/Hip Exercises: Machines for Strengthening   Cybex Knee Flexion  20# 2x10, RLE 20lb 2x5       Knee/Hip Exercises: Standing   Heel Raises  Both;2 sets;15 reps;2 seconds      Knee/Hip Exercises: Seated   Long Arc Quad  Right;2 sets;10 reps;Weights;5 reps passive stretch at the bottom of each rep     Con-wayLong Arc Quad Weight  3 lbs.    Hamstring Curl  Right;2 sets;15 reps    Hamstring Limitations  green Tband       Modalities   Modalities  Vasopneumatic      Vasopneumatic   Number Minutes Vasopneumatic   15 minutes    Vasopnuematic Location   Knee    Vasopneumatic Pressure  Medium    Vasopneumatic Temperature   37      Manual Therapy   Manual Therapy  Passive ROM;Soft  tissue mobilization    Passive ROM  R kne  flexion and extension               PT Short Term Goals - 07/14/17 1031      PT SHORT TERM GOAL #1   Title  independent with initial HEP    Status  Achieved        PT Long Term Goals - 07/14/17 1031      PT LONG TERM GOAL #1   Title  independent with RICE    Status  Achieved      PT LONG TERM GOAL #2   Title  walk without devie with minimal deviation    Status  On-going      PT LONG TERM GOAL #3   Title  go up and down stairs reciprocally    Status  On-going      PT LONG TERM GOAL #5   Title  increase strength to 5/5    Status  On-going            Plan - 07/17/17 1651    Clinical Impression Statement  Again R knee is very tight and swollen. Pt with an increase in R knee active extension. Some assist needed with SL on the leg press for the first set. Ambulated with rigid RLE requiring cues to perform heel strike and toe off. Pain at the end range reported with MT.      Rehab Potential  Good    PT Frequency  3x / week    PT Duration  8 weeks    PT Treatment/Interventions  ADLs/Self Care Home Management;Cryotherapy;Electrical Stimulation;Gait training;Balance training;Therapeutic  exercise;Therapeutic activities;Functional mobility training;Stair training;Patient/family education;Manual techniques;Vasopneumatic Device    PT Next Visit Plan  get him walking normally and work on ROM       Patient will benefit from skilled therapeutic intervention in order to improve the following deficits and impairments:  Abnormal gait, Decreased range of motion, Difficulty walking, Increased muscle spasms, Cardiopulmonary status limiting activity, Decreased activity tolerance, Pain, Decreased balance, Impaired flexibility, Increased edema, Decreased strength, Decreased mobility  Visit Diagnosis: Stiffness of right knee, not elsewhere classified  Acute pain of right knee  Localized edema  Difficulty in walking, not elsewhere classified     Problem List Patient Active Problem List   Diagnosis Date Noted  . Status post revision of total knee, right 06/16/2017  . 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 Presence of right artificial knee joint 05/10/2017  . Chronic pain of right knee 05/10/2017  . Plantar fasciitis of right foot 01/26/2015  . Equinus deformity of foot, acquired 01/26/2015  . Rectus diastasis 09/22/2011    Grayce Sessions, PTA 07/17/2017, 4:57 PM  [facility] [address] [place], [place], [address] Phone: [phone]   Fax:  [phone]  Name: BRAETON WOLGAMOTT MRN: [id number] Date of Birth: 19-Apr-1945

## 2017-07-19 ENCOUNTER — Ambulatory Visit: Payer: Medicare Other | Admitting: Physical Therapy

## 2017-07-19 ENCOUNTER — Telehealth (INDEPENDENT_AMBULATORY_CARE_PROVIDER_SITE_OTHER): Payer: Self-pay | Admitting: Orthopaedic Surgery

## 2017-07-19 ENCOUNTER — Encounter: Payer: Self-pay | Admitting: Physical Therapy

## 2017-07-19 DIAGNOSIS — R600 Localized edema: Secondary | ICD-10-CM

## 2017-07-19 DIAGNOSIS — M25661 Stiffness of right knee, not elsewhere classified: Secondary | ICD-10-CM

## 2017-07-19 DIAGNOSIS — M25561 Pain in right knee: Secondary | ICD-10-CM | POA: Diagnosis not present

## 2017-07-19 DIAGNOSIS — R262 Difficulty in walking, not elsewhere classified: Secondary | ICD-10-CM

## 2017-07-19 NOTE — Telephone Encounter (Signed)
Patient called saying that his right knee is very swollen, has an appointment on the 1st of may but needed to know if this was something to worry about or if he should come in sooner. CB # [phone]

## 2017-07-19 NOTE — Therapy (Signed)
[facility] [address] [place], [place], [address] Phone: [phone]   Fax:  [phone]  Physical Therapy Treatment  Patient Details  Name: Tommy Bailey MRN: [id number] Date of Birth: 1946-03-16 Referring Provider: Magnus Ivan   Encounter Date: 07/19/2017  PT End of Session - 07/19/17 1645    Visit Number  6    Date for PT Re-Evaluation  09/03/17    PT Start Time  1556    PT Stop Time  1655    PT Time Calculation (min)  59 min    Activity Tolerance  Patient tolerated treatment well    Behavior During Therapy  [facility] for tasks assessed/performed       Past Medical History:  Diagnosis Date  . Anxiety   . Arthritis   . Depression   . 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 GERD (gastroesophageal reflux disease)   . Hiatal hernia   . Muscle cramps    back and upper leg  . Plantar fasciitis   . SOB (shortness of breath)     Past Surgical History:  Procedure Laterality Date  . COLONOSCOPY    . SHOULDER SURGERY  2008   both  . TOTAL KNEE ARTHROPLASTY Right 2015  . TOTAL KNEE REVISION Right 06/16/2017   Procedure: RIGHT TOTAL KNEE REVISION VS. LYSIS OF ADHESIONS AND POLY LINER EXCHANGE;  Surgeon: Kathryne Hitch, MD;  [facility];  Service: Orthopedics;  Laterality: Right;  with block   . UPPER GI ENDOSCOPY    . VENTRAL HERNIA REPAIR  09/2010   with mesh    There were no vitals filed for this visit.  Subjective Assessment - 07/19/17 1554    Subjective  "I am sad to report but I am having a bad day"    Currently in Pain?  Yes    Pain Score  4     Pain Location  Knee    Pain Orientation  Right                       [facility] Adult PT Treatment/Exercise - 07/19/17 0001      Knee/Hip Exercises: Aerobic   Recumbent Bike  Partial rev x4 min     Nustep  level 4 x 6 minutes      Knee/Hip Exercises: Standing   Heel Raises  Both;2 sets;15 reps;2 seconds    Hip  Flexion  Right;2 sets;10 reps;Knee bent      Knee/Hip Exercises: Seated   Long Arc Quad  Right;2 sets;10 reps;Weights;5 reps    Hamstring Curl  Right;2 sets;15 reps    Hamstring Limitations  green Tband       Vasopneumatic   Number Minutes Vasopneumatic   15 minutes    Vasopnuematic Location   Knee    Vasopneumatic Pressure  Medium    Vasopneumatic Temperature   37      Manual Therapy   Manual Therapy  Passive ROM    Passive ROM  R kne  flexion and extension               PT Short Term Goals - 07/14/17 1031      PT SHORT TERM GOAL #1   Title  independent with initial HEP    Status  Achieved        PT Long Term Goals - 07/14/17 1031      PT LONG TERM GOAL #  1   Title  independent with RICE    Status  Achieved      PT LONG TERM GOAL #2   Title  walk without devie with minimal deviation    Status  On-going      PT LONG TERM GOAL #3   Title  go up and down stairs reciprocally    Status  On-going      PT LONG TERM GOAL #5   Title  increase strength to 5/5    Status  On-going            Plan - 07/19/17 1646    Clinical Impression Statement  Pt c/o being unable to sleep the night after last PT treatment due R knee aching. He has increased swelling and tightness in R Knee, passive ROM wasn't as aggressive. Pt exhibited hip hike on right side in order to clear 4" and 6" box.  Pt had increased stiffness noted with LAQ, HC, and standing up hip flexion with bent knee.    Rehab Potential  Good    PT Frequency  3x / week    PT Duration  8 weeks    PT Treatment/Interventions  ADLs/Self Care Home Management;Cryotherapy;Electrical Stimulation;Gait training;Balance training;Therapeutic exercise;Therapeutic activities;Functional mobility training;Stair training;Patient/family education;Manual techniques;Vasopneumatic Device    PT Next Visit Plan  get him walking normally and work on ROM       Patient will benefit from skilled therapeutic intervention in order to  improve the following deficits and impairments:  Abnormal gait, Decreased range of motion, Difficulty walking, Increased muscle spasms, Cardiopulmonary status limiting activity, Decreased activity tolerance, Pain, Decreased balance, Impaired flexibility, Increased edema, Decreased strength, Decreased mobility  Visit Diagnosis: Stiffness of right knee, not elsewhere classified  Acute pain of right knee  Localized edema  Difficulty in walking, not elsewhere classified     Problem List Patient Active Problem List   Diagnosis Date Noted  . Status post revision of total knee, right 06/16/2017  . 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 Presence of right artificial knee joint 05/10/2017  . Chronic pain of right knee 05/10/2017  . Plantar fasciitis of right foot 01/26/2015  . Equinus deformity of foot, acquired 01/26/2015  . Rectus diastasis 09/22/2011    [name], PTA 07/19/2017, 4:58 PM  [facility] [address] [place], [place], [address] Phone: [phone]   Fax:  [phone]  Name: Tommy Bailey MRN: [id number] Date of Birth: Nov 13, 1945

## 2017-07-19 NOTE — Telephone Encounter (Signed)
This is ok and normal as long as there is no heat/redness or fever

## 2017-07-24 ENCOUNTER — Ambulatory Visit: Payer: Medicare Other | Admitting: Physical Therapy

## 2017-07-24 ENCOUNTER — Encounter: Payer: Self-pay | Admitting: Physical Therapy

## 2017-07-24 DIAGNOSIS — M25661 Stiffness of right knee, not elsewhere classified: Secondary | ICD-10-CM

## 2017-07-24 DIAGNOSIS — R600 Localized edema: Secondary | ICD-10-CM

## 2017-07-24 DIAGNOSIS — M25561 Pain in right knee: Secondary | ICD-10-CM | POA: Diagnosis not present

## 2017-07-24 DIAGNOSIS — R262 Difficulty in walking, not elsewhere classified: Secondary | ICD-10-CM

## 2017-07-24 NOTE — Therapy (Signed)
[facility] [address] [place], [place], [address] Phone: [phone]   Fax:  [phone]  Physical Therapy Treatment  Patient Details  Name: Tommy Bailey MRN: [id number] Date of Birth: 25-Oct-1945 Referring Provider: Magnus Ivan   Encounter Date: 07/24/2017  PT End of Session - 07/24/17 1404    Visit Number  7    Date for PT Re-Evaluation  09/03/17    PT Start Time  1310    PT Stop Time  1400    PT Time Calculation (min)  50 min    Activity Tolerance  Patient tolerated treatment well    Behavior During Therapy  [facility] for tasks assessed/performed       Past Medical History:  Diagnosis Date  . Anxiety   . Arthritis   . Depression   . 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 GERD (gastroesophageal reflux disease)   . Hiatal hernia   . Muscle cramps    back and upper leg  . Plantar fasciitis   . SOB (shortness of breath)     Past Surgical History:  Procedure Laterality Date  . COLONOSCOPY    . SHOULDER SURGERY  2008   both  . TOTAL KNEE ARTHROPLASTY Right 2015  . TOTAL KNEE REVISION Right 06/16/2017   Procedure: RIGHT TOTAL KNEE REVISION VS. LYSIS OF ADHESIONS AND POLY LINER EXCHANGE;  Surgeon: Kathryne Hitch, MD;  Location: [facility];  Service: Orthopedics;  Laterality: Right;  with block   . UPPER GI ENDOSCOPY    . VENTRAL HERNIA REPAIR  09/2010   with mesh    There were no vitals filed for this visit.  Subjective Assessment - 07/24/17 1311    Subjective  Patient reports still frustrated in the swelling of the knee, reports he will see MD this week about it.  He reports putting on a compression sleeve on it and it feels better, he still has significant limp when getting up from sitting, he walks with a stiff leg, does not bend the knee    Currently in Pain?  Yes    Pain Score  2     Aggravating Factors   getting up from sitting, at night is very sore, pain up to 8/10                        [facility] Adult PT Treatment/Exercise - 07/24/17 0001      Knee/Hip Exercises: Aerobic   Tread Mill  backward 1 mph, doing to get him to bend knee and decrease hip hik and stiff  legged gait x 3 minutes    Nustep  level 4 x 6 minutes      Knee/Hip Exercises: Machines for Strengthening   Cybex Knee Extension  5# 2x5    Cybex Knee Flexion  right leg only 15# 2x15    Cybex Leg Press  20# 2x10, then no weight wroking on flexion, then no weight with right leg only      Knee/Hip Exercises: Supine   Short Arc Quad Sets  Right;2 sets;10 reps    Other Supine Knee/Hip Exercises  feet on ball bridge and K2C with some over pressure      Manual Therapy   Manual Therapy  Soft tissue mobilization;Joint mobilization;Passive ROM    Joint Mobilization  use of belt to mobilize the tibia on the femur with some distraction to gain ROM  Soft tissue mobilization  to the scar and the tissue around the patella    Passive ROM  PROM of the right knee flexion               PT Short Term Goals - 07/14/17 1031      PT SHORT TERM GOAL #1   Title  independent with initial HEP    Status  Achieved        PT Long Term Goals - 07/24/17 1438      PT LONG TERM GOAL #2   Title  walk without devie with minimal deviation    Status  On-going      PT LONG TERM GOAL #3   Title  go up and down stairs reciprocally    Status  On-going            Plan - 07/24/17 1404    Clinical Impression Statement  Patient continues to have a very stiff knee, he does not report much pain until he changes positions or we try to stretch, pain in superior patellar area.  He was able to bend the knee with walking backwards nmaking me think it may be a habit that he walks so stiff legged, the issue is though the pain iwth any stretches    PT Next Visit Plan  see if we can get ROM back, he may see the MD this week    Consulted and Agree with Plan of Care  Patient       Patient will  benefit from skilled therapeutic intervention in order to improve the following deficits and impairments:  Abnormal gait, Decreased range of motion, Difficulty walking, Increased muscle spasms, Cardiopulmonary status limiting activity, Decreased activity tolerance, Pain, Decreased balance, Impaired flexibility, Increased edema, Decreased strength, Decreased mobility  Visit Diagnosis: Stiffness of right knee, not elsewhere classified  Acute pain of right knee  Localized edema  Difficulty in walking, not elsewhere classified     Problem List Patient Active Problem List   Diagnosis Date Noted  . Status post revision of total knee, right 06/16/2017  . 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 Presence of right artificial knee joint 05/10/2017  . Chronic pain of right knee 05/10/2017  . Plantar fasciitis of right foot 01/26/2015  . Equinus deformity of foot, acquired 01/26/2015  . Rectus diastasis 09/22/2011    Jearld LeschALBRIGHT,Kastin Cerda W., PT 07/24/2017, 2:39 PM  [facility] [address] [place], [place], [address] Phone: [phone]   Fax:  [phone]  Name: Tommy Bailey MRN: [id number] Date of Birth: November 12, 1945

## 2017-07-26 ENCOUNTER — Ambulatory Visit (INDEPENDENT_AMBULATORY_CARE_PROVIDER_SITE_OTHER): Payer: Medicare Other | Admitting: Orthopaedic Surgery

## 2017-07-26 ENCOUNTER — Encounter: Payer: Self-pay | Admitting: Physical Therapy

## 2017-07-26 ENCOUNTER — Encounter (INDEPENDENT_AMBULATORY_CARE_PROVIDER_SITE_OTHER): Payer: Self-pay | Admitting: Orthopaedic Surgery

## 2017-07-26 ENCOUNTER — Ambulatory Visit: Payer: Medicare Other | Admitting: Physical Therapy

## 2017-07-26 DIAGNOSIS — M25661 Stiffness of right knee, not elsewhere classified: Secondary | ICD-10-CM

## 2017-07-26 DIAGNOSIS — R600 Localized edema: Secondary | ICD-10-CM

## 2017-07-26 DIAGNOSIS — Z96651 Presence of right artificial knee joint: Secondary | ICD-10-CM

## 2017-07-26 DIAGNOSIS — M25561 Pain in right knee: Secondary | ICD-10-CM

## 2017-07-26 NOTE — Progress Notes (Signed)
The patient is following up today after knee revision surgery of his right knee.  He is 40 days out from the surgery.  He said a lot of swelling and is wearing a compressive sleeve and a compressive hose.  This is limiting his physical therapy.  On exam he does have a large swollen knee but I cannot aspirate any fluid off the knee at all.  This looks more like just postoperative swelling in general.  He has almost full extension but his flexion is well less than 90 degrees.  I did place a steroid shot in his knee to see if this would help with a therapy standpoint.  Apparently after his first surgery that was done elsewhere he needed a manipulation under anesthesia and I think we are heading that route as well.  I would like to see him back in 3 weeks to see how is doing overall.  I would like an AP and lateral of his right knee at that visit.  If his motion does not improve we will have to consider manipulation.

## 2017-07-26 NOTE — Therapy (Signed)
[facility] [address] [place], [place], [address] Phone: [phone]   Fax:  [phone]  Physical Therapy Treatment  Patient Details  Name: Tommy Bailey MRN: [id number] Date of Birth: [birth year] Referring Provider: Magnus IvanBlackman   Encounter Date: 07/26/2017  PT End of Session - 07/26/17 1654    Visit Number  8    Date for PT Re-Evaluation  09/03/17    PT Start Time  1610    PT Stop Time  1650    PT Time Calculation (min)  40 min    Activity Tolerance  Patient limited by pain    Behavior During Therapy  [facility] for tasks assessed/performed       Past Medical History:  Diagnosis Date  . Anxiety   . Arthritis   . Depression   . 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 GERD (gastroesophageal reflux disease)   . Hiatal hernia   . Muscle cramps    back and upper leg  . Plantar fasciitis   . SOB (shortness of breath)     Past Surgical History:  Procedure Laterality Date  . COLONOSCOPY    . SHOULDER SURGERY  2008   both  . TOTAL KNEE ARTHROPLASTY Right 2015  . TOTAL KNEE REVISION Right 06/16/2017   Procedure: RIGHT TOTAL KNEE REVISION VS. LYSIS OF ADHESIONS AND POLY LINER EXCHANGE;  Surgeon: Kathryne HitchBlackman, Christopher Y, MD;  Location: [facility];  Service: Orthopedics;  Laterality: Right;  with block   . UPPER GI ENDOSCOPY    . VENTRAL HERNIA REPAIR  09/2010   with mesh    There were no vitals filed for this visit.  Subjective Assessment - 07/26/17 1612    Subjective  Pt reported that he went to the doctor today, who attempted to remove fluid in his right knee.    Currently in Pain?  No/denies                       [facility]/Exercise - 07/26/17 0001      Knee/Hip Exercises: Aerobic   Nustep  L4 x  8min      Knee/Hip Exercises: Machines for Strengthening   Cybex Knee Flexion  15lb 2x10      Knee/Hip Exercises: Supine   Heel Slides  AAROM;Right;2 sets;10 reps       Manual Therapy   Manual Therapy  Soft tissue mobilization;Passive ROM    Soft tissue mobilization  superior and medial area around patella avoiding where he had a shot    Passive ROM  PROM of the right knee flexion             PT Education - 07/26/17 1653    Education provided  Yes    Education Details  Heel slides in bed     Person(s) Educated  Patient    Methods  Explanation;Demonstration       PT Short Term Goals - 07/14/17 1031      PT SHORT TERM GOAL #1   Title  independent with initial HEP    Status  Achieved        PT Long Term Goals - 07/24/17 1438      PT LONG TERM GOAL #2   Title  walk without devie with minimal deviation    Status  On-going      PT LONG TERM GOAL #3   Title  go up and  down stairs reciprocally    Status  On-going            Plan - 07/26/17 1655    Clinical Impression Statement  Pt arrived ~9 minutes late. he still has edema and stiff right knee. pt c/o of tightness on the quadriceps tendon, and reported that STM helped last session. Transitioned from strengthening exercises to [facility] due to lack of knee flexion.    Rehab Potential  Good    PT Duration  8 weeks    PT Treatment/Interventions  ADLs/Self Care Home Management;Cryotherapy;Electrical Stimulation;Gait training;Balance training;Therapeutic exercise;Therapeutic activities;Functional mobility training;Stair training;Patient/family education;Manual techniques;Vasopneumatic Device    PT Next Visit Plan  continue with ROM exercises        Patient will benefit from skilled therapeutic intervention in order to improve the following deficits and impairments:  Abnormal gait, Decreased range of motion, Difficulty walking, Increased muscle spasms, Cardiopulmonary status limiting activity, Decreased activity tolerance, Pain, Decreased balance, Impaired flexibility, Increased edema, Decreased strength, Decreased mobility  Visit Diagnosis: Stiffness of right knee, not elsewhere  classified  Acute pain of right knee  Localized edema     Problem List Patient Active Problem List   Diagnosis Date Noted  . Status post revision of total knee, right 06/16/2017  . 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 Presence of right artificial knee joint 05/10/2017  . Chronic pain of right knee 05/10/2017  . Plantar fasciitis of right foot 01/26/2015  . Equinus deformity of foot, acquired 01/26/2015  . Rectus diastasis 09/22/2011    [name] 07/26/2017, 5:00 PM  [facility] [place] [address] [place], [place], [address] Phone: [phone]   Fax:  [phone]  Name: Tommy Bailey MRN: [id number] Date of Birth: [birth year]

## 2017-07-27 ENCOUNTER — Telehealth (INDEPENDENT_AMBULATORY_CARE_PROVIDER_SITE_OTHER): Payer: Self-pay | Admitting: Orthopaedic Surgery

## 2017-07-27 NOTE — Telephone Encounter (Signed)
Patient called wanting to speak with you about knee manipulation? He just had a few questions for you and wanted to get your opinion. CB # [phone]

## 2017-07-28 ENCOUNTER — Ambulatory Visit: Payer: Medicare Other | Admitting: Physical Therapy

## 2017-07-28 ENCOUNTER — Encounter: Payer: Self-pay | Admitting: Physical Therapy

## 2017-07-28 DIAGNOSIS — M25561 Pain in right knee: Secondary | ICD-10-CM | POA: Diagnosis not present

## 2017-07-28 DIAGNOSIS — M25661 Stiffness of right knee, not elsewhere classified: Secondary | ICD-10-CM

## 2017-07-28 DIAGNOSIS — R262 Difficulty in walking, not elsewhere classified: Secondary | ICD-10-CM

## 2017-07-28 DIAGNOSIS — R600 Localized edema: Secondary | ICD-10-CM

## 2017-07-28 NOTE — Therapy (Signed)
[facility] [place] [facility] [address] [place], [place], [address] Phone: [phone]   Fax:  [phone]  Physical Therapy Treatment  Patient Details  Name: Tommy Bailey MRN: [id number] Date of Birth: 1945/04/15 Referring Provider: Magnus IvanBlackman   Encounter Date: 07/28/2017  PT End of Session - 07/28/17 1151    Visit Number  9    Date for PT Re-Evaluation  09/03/17    PT Start Time  1057    PT Stop Time  1145    PT Time Calculation (min)  48 min    Activity Tolerance  Patient limited by pain    Behavior During Therapy  [facility] for tasks assessed/performed       Past Medical History:  Diagnosis Date  . Anxiety   . Arthritis   . Depression   . 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 GERD (gastroesophageal reflux disease)   . Hiatal hernia   . Muscle cramps    back and upper leg  . Plantar fasciitis   . SOB (shortness of breath)     Past Surgical History:  Procedure Laterality Date  . COLONOSCOPY    . SHOULDER SURGERY  2008   both  . TOTAL KNEE ARTHROPLASTY Right 2015  . TOTAL KNEE REVISION Right 06/16/2017   Procedure: RIGHT TOTAL KNEE REVISION VS. LYSIS OF ADHESIONS AND POLY LINER EXCHANGE;  Surgeon: Kathryne HitchBlackman, Christopher Y, MD;  Location: [facility];  Service: Orthopedics;  Laterality: Right;  with block   . UPPER GI ENDOSCOPY    . VENTRAL HERNIA REPAIR  09/2010   with mesh    There were no vitals filed for this visit.  Subjective Assessment - 07/28/17 1054    Subjective  Pt reported that he isn't able to bend his knee very well and isn't able to change positions without feeling pain. He reports having 9/10 pain when standing up. His wife reported that the doctory may want to manually manipulate his knee.     Currently in Pain?  No/denies                       [facility] Adult PT Treatment/Exercise - 07/28/17 0001      Knee/Hip Exercises: Aerobic   Nustep  L4x237min      Knee/Hip Exercises:  Machines for Strengthening   Cybex Knee Flexion  5lb 1x5    Cybex Leg Press  20lb 1x12      Manual Therapy   Manual Therapy  Soft tissue mobilization;Passive ROM    Soft tissue mobilization  to the scar, anterior thigh    Passive ROM  PROM of the right knee flexion and extension                PT Short Term Goals - 07/14/17 1031      PT SHORT TERM GOAL #1   Title  independent with initial HEP    Status  Achieved        PT Long Term Goals - 07/24/17 1438      PT LONG TERM GOAL #2   Title  walk without devie with minimal deviation    Status  On-going      PT LONG TERM GOAL #3   Title  go up and down stairs reciprocally    Status  On-going            Plan - 07/28/17 1152    Clinical Impression  Statement  Pt's wife reported that the doctor may do a manipulation on right knee. Pt has significant scar tissue and tightness on the superior quadriceps tendon that prevents him from doing knee flexion. Pt c/o of  tightness and pain when initiating movement  with flexion and extension. Focus of the session today was STM with stretching to increase R knee flexion.  Pt chose to ice at home today following PT and discussed with him to keep his knee moving to help prevent scar tissue build up.     Rehab Potential  Good    PT Frequency  3x / week    PT Duration  8 weeks    PT Treatment/Interventions  ADLs/Self Care Home Management;Cryotherapy;Electrical Stimulation;Gait training;Balance training;Therapeutic exercise;Therapeutic activities;Functional mobility training;Stair training;Patient/family education;Manual techniques;Vasopneumatic Device    PT Next Visit Plan  continue with STM,stretching and strengthening        Patient will benefit from skilled therapeutic intervention in order to improve the following deficits and impairments:  Abnormal gait, Decreased range of motion, Difficulty walking, Increased muscle spasms, Cardiopulmonary status limiting activity, Decreased  activity tolerance, Pain, Decreased balance, Impaired flexibility, Increased edema, Decreased strength, Decreased mobility  Visit Diagnosis: Stiffness of right knee, not elsewhere classified  Acute pain of right knee  Localized edema  Difficulty in walking, not elsewhere classified     Problem List Patient Active Problem List   Diagnosis Date Noted  . Status post revision of total knee, right 06/16/2017  . 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 Presence of right artificial knee joint 05/10/2017  . Chronic pain of right knee 05/10/2017  . Plantar fasciitis of right foot 01/26/2015  . Equinus deformity of foot, acquired 01/26/2015  . Rectus diastasis 09/22/2011    [name] 07/28/2017, 11:59 AM  [facility] [place] [address] [place], [place], [address] Phone: [phone]   Fax:  [phone]  Name: Tommy Bailey MRN: [id number] Date of Birth: [birth year]

## 2017-07-28 NOTE — Telephone Encounter (Signed)
Patient called and is really interested in doing a manipulation sooner than letter  Would like to speak with you about this  States he and his wife had all intentions of moving back to [place] summer and really doesn't want to prolong this longer

## 2017-07-31 ENCOUNTER — Telehealth (INDEPENDENT_AMBULATORY_CARE_PROVIDER_SITE_OTHER): Payer: Self-pay | Admitting: Orthopaedic Surgery

## 2017-07-31 ENCOUNTER — Ambulatory Visit: Payer: Medicare Other | Admitting: Physical Therapy

## 2017-07-31 NOTE — Telephone Encounter (Signed)
I gave a schedule sleep for Tommy Bailey this morning and told her to call him to schedule this for this week.

## 2017-07-31 NOTE — Telephone Encounter (Signed)
Patient called wanting to schedule the right knee manipulation as soon as possible. The number to contact patient is [phone]

## 2017-07-31 NOTE — Telephone Encounter (Signed)
See below

## 2017-07-31 NOTE — Telephone Encounter (Signed)
I will give you a posting sheet for this week

## 2017-08-01 ENCOUNTER — Encounter: Payer: Self-pay | Admitting: Physical Therapy

## 2017-08-01 ENCOUNTER — Ambulatory Visit: Payer: Medicare Other | Admitting: Physical Therapy

## 2017-08-01 DIAGNOSIS — M25561 Pain in right knee: Secondary | ICD-10-CM

## 2017-08-01 DIAGNOSIS — M25661 Stiffness of right knee, not elsewhere classified: Secondary | ICD-10-CM

## 2017-08-01 DIAGNOSIS — R262 Difficulty in walking, not elsewhere classified: Secondary | ICD-10-CM

## 2017-08-01 NOTE — Telephone Encounter (Signed)
I called patient and scheduled surgery. 

## 2017-08-01 NOTE — Therapy (Signed)
[facility] [place] [facility] [address] [place], [place], [address] Phone: [phone]   Fax:  [phone]  Physical Therapy Treatment  Patient Details  Name: Tommy Bailey MRN: [id number] Date of Birth: 1946/03/19 Referring Provider: Magnus Ivan   Encounter Date: 08/01/2017  PT End of Session - 08/01/17 1714    Visit Number  10    Date for PT Re-Evaluation  09/03/17    PT Start Time  1610    PT Stop Time  1700    PT Time Calculation (min)  50 min    Activity Tolerance  Patient limited by pain    Behavior During Therapy  [facility] for tasks assessed/performed       Past Medical History:  Diagnosis Date  . Anxiety   . Arthritis   . Depression   . 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 GERD (gastroesophageal reflux disease)   . Hiatal hernia   . Muscle cramps    back and upper leg  . Plantar fasciitis   . SOB (shortness of breath)     Past Surgical History:  Procedure Laterality Date  . COLONOSCOPY    . SHOULDER SURGERY  2008   both  . TOTAL KNEE ARTHROPLASTY Right 2015  . TOTAL KNEE REVISION Right 06/16/2017   Procedure: RIGHT TOTAL KNEE REVISION VS. LYSIS OF ADHESIONS AND POLY LINER EXCHANGE;  Surgeon: Kathryne Hitch, MD;  Location: [facility];  Service: Orthopedics;  Laterality: Right;  with block   . UPPER GI ENDOSCOPY    . VENTRAL HERNIA REPAIR  09/2010   with mesh    There were no vitals filed for this visit.  Subjective Assessment - 08/01/17 1707    Subjective  Patient reports that he is going to have a manipulation on Thursday, I spoke with him and his wife, wnt over getting appoinments to set up fro after the manipulation and then what exercises to do over the weekend if we do not get to see him    Currently in Pain?  No/denies    Aggravating Factors   pain with movements to get flexion and extension pain up to 8/10                       [facility] Adult PT Treatment/Exercise -  08/01/17 0001      Knee/Hip Exercises: Aerobic   Nustep  L4x27min      Knee/Hip Exercises: Machines for Strengthening   Cybex Leg Press  Worked with weight and without weight, trying to get flexion, then we also did no weight with right leg only to get quad activation      Manual Therapy   Manual Therapy  Soft tissue mobilization;Passive ROM    Joint Mobilization  use of belt to mobilize the tibia on the femur with some distraction to gain ROM, did this in sitting and in standing during partial squats    Soft tissue mobilization  to the scar, anterior thigh    Passive ROM  PROM of the right knee flexion and extension              PT Education - 08/01/17 1714    Education provided  Yes    Education Details  went over what he is to do after manipulation if we do not get to see him prior to the weekend    Person(s) Educated  Patient;Spouse    Methods  Explanation;Demonstration;Tactile cues;Verbal cues    Comprehension  Verbalized understanding;Returned demonstration       PT Short Term Goals - 07/14/17 1031      PT SHORT TERM GOAL #1   Title  independent with initial HEP    Status  Achieved        PT Long Term Goals - 07/24/17 1438      PT LONG TERM GOAL #2   Title  walk without devie with minimal deviation    Status  On-going      PT LONG TERM GOAL #3   Title  go up and down stairs reciprocally    Status  On-going            Plan - 08/01/17 1714    Clinical Impression Statement  Patient will have manipulation, he has a lot of pain with flexion and coming out of flexion.  He is frustrated with this but he is also happy that he is going to have the manipulation to make the leg better.    PT Next Visit Plan  will see daily after manipulation working on ROM and a better gait pattern    Consulted and Agree with Plan of Care  Patient       Patient will benefit from skilled therapeutic intervention in order to improve the following deficits and impairments:   Abnormal gait, Decreased range of motion, Difficulty walking, Increased muscle spasms, Cardiopulmonary status limiting activity, Decreased activity tolerance, Pain, Decreased balance, Impaired flexibility, Increased edema, Decreased strength, Decreased mobility  Visit Diagnosis: Stiffness of right knee, not elsewhere classified  Acute pain of right knee  Difficulty in walking, not elsewhere classified     Problem List Patient Active Problem List   Diagnosis Date Noted  . Status post revision of total knee, right 06/16/2017  . 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 Presence of right artificial knee joint 05/10/2017  . Chronic pain of right knee 05/10/2017  . Plantar fasciitis of right foot 01/26/2015  . Equinus deformity of foot, acquired 01/26/2015  . Rectus diastasis 09/22/2011    Jearld Lesch., PT 08/01/2017, 5:16 PM  [facility] [place] [facility] [address] [place], [place], [address] Phone: [phone]   Fax:  [phone]  Name: Tommy Bailey MRN: [id number] Date of Birth: 1945/12/15

## 2017-08-02 ENCOUNTER — Ambulatory Visit (INDEPENDENT_AMBULATORY_CARE_PROVIDER_SITE_OTHER): Payer: Medicare Other | Admitting: Orthopaedic Surgery

## 2017-08-03 ENCOUNTER — Ambulatory Visit: Payer: Medicare Other | Admitting: Physical Therapy

## 2017-08-03 DIAGNOSIS — M24661 Ankylosis, right knee: Secondary | ICD-10-CM | POA: Diagnosis not present

## 2017-08-04 ENCOUNTER — Ambulatory Visit: Payer: Medicare Other | Attending: Orthopaedic Surgery | Admitting: Physical Therapy

## 2017-08-04 ENCOUNTER — Encounter: Payer: Self-pay | Admitting: Physical Therapy

## 2017-08-04 DIAGNOSIS — M25661 Stiffness of right knee, not elsewhere classified: Secondary | ICD-10-CM

## 2017-08-04 DIAGNOSIS — R262 Difficulty in walking, not elsewhere classified: Secondary | ICD-10-CM | POA: Diagnosis present

## 2017-08-04 DIAGNOSIS — M25561 Pain in right knee: Secondary | ICD-10-CM | POA: Insufficient documentation

## 2017-08-04 DIAGNOSIS — R600 Localized edema: Secondary | ICD-10-CM

## 2017-08-04 NOTE — Therapy (Signed)
[facility] [place] [facility] [address] [place], [place], [address] Phone: [phone]   Fax:  [phone]  Physical Therapy Treatment  Patient Details  Name: Tommy Bailey MRN: [id number] Date of Birth: [birth year] Referring Provider: Magnus Ivan   Encounter Date: 08/04/2017  PT End of Session - 08/04/17 1150    Visit Number  11    Date for PT Re-Evaluation  09/03/17    PT Start Time  1100    PT Stop Time  1158    PT Time Calculation (min)  58 min       Past Medical History:  Diagnosis Date  . Anxiety   . Arthritis   . Depression   . 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 GERD (gastroesophageal reflux disease)   . Hiatal hernia   . Muscle cramps    back and upper leg  . Plantar fasciitis   . SOB (shortness of breath)     Past Surgical History:  Procedure Laterality Date  . COLONOSCOPY    . SHOULDER SURGERY  2008   both  . TOTAL KNEE ARTHROPLASTY Right 2015  . TOTAL KNEE REVISION Right 06/16/2017   Procedure: RIGHT TOTAL KNEE REVISION VS. LYSIS OF ADHESIONS AND POLY LINER EXCHANGE;  Surgeon: Kathryne Hitch, MD;  Location: [facility];  Service: Orthopedics;  Laterality: Right;  with block   . UPPER GI ENDOSCOPY    . VENTRAL HERNIA REPAIR  09/2010   with mesh    There were no vitals filed for this visit.  Subjective Assessment - 08/04/17 1101    Subjective  Pt reports having a manipulation yesterday, Pt reports not talking to MD afterwards. Pt's wife reports that's he thinks the MD said "70" degrees. Pt reports that he has had some tightness     Currently in Pain?  No/denies    Pain Score  0-No pain         [facility] PT Assessment - 08/04/17 0001      AROM   AROM Assessment Site  Knee    Right/Left Knee  Right    Right Knee Extension  20    Right Knee Flexion  75      PROM   PROM Assessment Site  Knee    Right/Left Knee  Right    Right Knee Extension  4    Right Knee Flexion  91                    [facility] Adult PT Treatment/Exercise - 08/04/17 0001      Knee/Hip Exercises: Aerobic   Recumbent Bike  Partial rev x , 1 min full revolutions with compensation       Knee/Hip Exercises: Standing   Hip Flexion  Right;2 sets;10 reps;Knee bent      Manual Therapy   Manual Therapy  Soft tissue mobilization;Passive ROM;Manual Traction;Muscle Energy Technique    Manual therapy comments  guarded to PROM    Joint Mobilization  tibiofemoral posterior anterior grades 1-2    Soft tissue mobilization  R quad     Passive ROM  PROM of the right knee flexion and extension,SOme PROM taken to end range and held    Muscle Energy Technique  contract relaxR knee to increase flexion               PT Short Term Goals - 07/14/17 1031      PT SHORT TERM GOAL #  1   Title  independent with initial HEP    Status  Achieved        PT Long Term Goals - 07/24/17 1438      PT LONG TERM GOAL #2   Title  walk without devie with minimal deviation    Status  On-going      PT LONG TERM GOAL #3   Title  go up and down stairs reciprocally    Status  On-going            Plan - 08/04/17 1152    Clinical Impression Statement  Pt returns to PT after manipulation to R knee. Pt with some improvement with passive and active flexion. Pt reports that he did not talk to MD after procedure but his wife did. Pt is guarded with MT, more so with the transition from the movents. Springy end feel noted with flexion. Overall ROM still limited and knee flees tight.     Rehab Potential  Good    PT Frequency  3x / week    PT Duration  8 weeks    PT Next Visit Plan  work on ROM and a better gait pattern       Patient will benefit from skilled therapeutic intervention in order to improve the following deficits and impairments:  Abnormal gait, Decreased range of motion, Difficulty walking, Increased muscle spasms, Cardiopulmonary status limiting activity, Decreased activity tolerance,  Pain, Decreased balance, Impaired flexibility, Increased edema, Decreased strength, Decreased mobility  Visit Diagnosis: Stiffness of right knee, not elsewhere classified  Acute pain of right knee  Difficulty in walking, not elsewhere classified  Localized edema     Problem List Patient Active Problem List   Diagnosis Date Noted  . Status post revision of total knee, right 06/16/2017  . 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 Presence of right artificial knee joint 05/10/2017  . Chronic pain of right knee 05/10/2017  . Plantar fasciitis of right foot 01/26/2015  . Equinus deformity of foot, acquired 01/26/2015  . Rectus diastasis 09/22/2011    Grayce Sessions, PTA 08/04/2017, 11:57 AM  [facility] [place] [address] [place], [place], [address] Phone: [phone]   Fax:  [phone]  Name: JAUN GALLUZZO MRN: [id number] Date of Birth: 1945-10-13

## 2017-08-07 ENCOUNTER — Encounter: Payer: Self-pay | Admitting: Physical Therapy

## 2017-08-07 ENCOUNTER — Ambulatory Visit: Payer: Medicare Other | Admitting: Physical Therapy

## 2017-08-07 DIAGNOSIS — R262 Difficulty in walking, not elsewhere classified: Secondary | ICD-10-CM

## 2017-08-07 DIAGNOSIS — M25661 Stiffness of right knee, not elsewhere classified: Secondary | ICD-10-CM | POA: Diagnosis not present

## 2017-08-07 DIAGNOSIS — M25561 Pain in right knee: Secondary | ICD-10-CM

## 2017-08-07 DIAGNOSIS — R600 Localized edema: Secondary | ICD-10-CM

## 2017-08-07 NOTE — Therapy (Signed)
[facility] [address] [place], [place], [address] Phone: [phone]   Fax:  [phone]  Physical Therapy Treatment  Patient Details  Name: Tommy Bailey MRN: [id number] Date of Birth: 05-Sep-1945 Referring Provider: Magnus Ivan   Encounter Date: 08/07/2017  PT End of Session - 08/07/17 1643    Visit Number  12    Date for PT Re-Evaluation  09/03/17    PT Start Time  1600    PT Stop Time  1652    PT Time Calculation (min)  52 min    Activity Tolerance  Patient limited by pain;Patient tolerated treatment well    Behavior During Therapy  [facility] for tasks assessed/performed       Past Medical History:  Diagnosis Date  . Anxiety   . Arthritis   . Depression   . 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 GERD (gastroesophageal reflux disease)   . Hiatal hernia   . Muscle cramps    back and upper leg  . Plantar fasciitis   . SOB (shortness of breath)     Past Surgical History:  Procedure Laterality Date  . COLONOSCOPY    . SHOULDER SURGERY  2008   both  . TOTAL KNEE ARTHROPLASTY Right 2015  . TOTAL KNEE REVISION Right 06/16/2017   Procedure: RIGHT TOTAL KNEE REVISION VS. LYSIS OF ADHESIONS AND POLY LINER EXCHANGE;  Surgeon: Kathryne Hitch, MD;  Location: [facility];  Service: Orthopedics;  Laterality: Right;  with block   . UPPER GI ENDOSCOPY    . VENTRAL HERNIA REPAIR  09/2010   with mesh    There were no vitals filed for this visit.  Subjective Assessment - 08/07/17 1605    Subjective  Pt reports that he noticed better movement but no elasticity    Currently in Pain?  No/denies    Pain Score  0-No pain                       [facility] Adult PT Treatment/Exercise - 08/07/17 0001      Knee/Hip Exercises: Aerobic   Recumbent Bike  seat 9 full rev x4 min     Nustep  L4 x 7 min       Knee/Hip Exercises: Machines for Strengthening   Cybex Knee Extension  5# 3x5    Cybex Knee  Flexion  RLE 15lb 2x10    Cybex Leg Press  20lb 2x15, RLe no weight working on flexion       Knee/Hip Exercises: Standing   Lateral Step Up  Right;1 set;10 reps;Hand Hold: 0;Step Height: 6"    Forward Step Up  Right;2 sets;10 reps;Hand Hold: 0;Step Height: 6"      Knee/Hip Exercises: Seated   Sit to Sand  2 sets;10 reps;without UE support blue chair, toes even       Vasopneumatic   Number Minutes Vasopneumatic   10 minutes    Vasopnuematic Location   Knee    Vasopneumatic Pressure  Medium    Vasopneumatic Temperature   37      Manual Therapy   Manual Therapy  Soft tissue mobilization;Passive ROM    Soft tissue mobilization  R quad     Passive ROM  PROM of the right knee flexion and extension,SOme PROM taken to end range and held               PT Short Term Goals -  07/14/17 1031      PT SHORT TERM GOAL #1   Title  independent with initial HEP    Status  Achieved        PT Long Term Goals - 07/24/17 1438      PT LONG TERM GOAL #2   Title  walk without devie with minimal deviation    Status  On-going      PT LONG TERM GOAL #3   Title  go up and down stairs reciprocally    Status  On-going            Plan - 08/07/17 1644    Clinical Impression Statement  Pt display better R knee mobility with all exercise interventions. Pt continues to amb with rigid RLE. All exercise completed swell with pt available ROM. He does report pain at the end rang of flexion and extension.     Rehab Potential  Good    PT Frequency  3x / week    PT Duration  8 weeks    PT Treatment/Interventions  ADLs/Self Care Home Management;Cryotherapy;Electrical Stimulation;Gait training;Balance training;Therapeutic exercise;Therapeutic activities;Functional mobility training;Stair training;Patient/family education;Manual techniques;Vasopneumatic Device    PT Next Visit Plan  work on ROM and a better gait pattern       Patient will benefit from skilled therapeutic intervention in order to  improve the following deficits and impairments:  Abnormal gait, Decreased range of motion, Difficulty walking, Increased muscle spasms, Cardiopulmonary status limiting activity, Decreased activity tolerance, Pain, Decreased balance, Impaired flexibility, Increased edema, Decreased strength, Decreased mobility  Visit Diagnosis: Stiffness of right knee, not elsewhere classified  Acute pain of right knee  Localized edema  Difficulty in walking, not elsewhere classified     Problem List Patient Active Problem List   Diagnosis Date Noted  . Status post revision of total knee, right 06/16/2017  . 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 Presence of right artificial knee joint 05/10/2017  . Chronic pain of right knee 05/10/2017  . Plantar fasciitis of right foot 01/26/2015  . Equinus deformity of foot, acquired 01/26/2015  . Rectus diastasis 09/22/2011    Grayce Sessions, PTA 08/07/2017, 4:47 PM  [facility] [place] [facility] [address] [place], [place], [address] Phone: [phone]   Fax:  [phone]  Name: Tommy Bailey MRN: [id number] Date of Birth: 1945-11-27

## 2017-08-09 ENCOUNTER — Encounter: Payer: Self-pay | Admitting: Physical Therapy

## 2017-08-09 ENCOUNTER — Ambulatory Visit: Payer: Medicare Other | Admitting: Physical Therapy

## 2017-08-09 ENCOUNTER — Telehealth (INDEPENDENT_AMBULATORY_CARE_PROVIDER_SITE_OTHER): Payer: Self-pay | Admitting: Orthopaedic Surgery

## 2017-08-09 DIAGNOSIS — M25661 Stiffness of right knee, not elsewhere classified: Secondary | ICD-10-CM | POA: Diagnosis not present

## 2017-08-09 DIAGNOSIS — R262 Difficulty in walking, not elsewhere classified: Secondary | ICD-10-CM

## 2017-08-09 DIAGNOSIS — M25561 Pain in right knee: Secondary | ICD-10-CM

## 2017-08-09 DIAGNOSIS — R600 Localized edema: Secondary | ICD-10-CM

## 2017-08-09 NOTE — Telephone Encounter (Signed)
Patient called wanting to know what his degree of bend was during his manipulation and also wanted to know if he could get a copy of the xray taken after the manipulation to have as a comparison. CB # [phone]

## 2017-08-09 NOTE — Telephone Encounter (Signed)
Please advise 

## 2017-08-09 NOTE — Telephone Encounter (Signed)
I left voicemail for patient advising. 

## 2017-08-09 NOTE — Therapy (Signed)
[facility] [address] [place], [place], [address] Phone: [phone]   Fax:  [phone]  Physical Therapy Treatment  Patient Details  Name: Tommy Bailey MRN: [id number] Date of Birth: [birth year] Referring Provider: Magnus Ivan   Encounter Date: 08/09/2017  PT End of Session - 08/09/17 1649    Visit Number  13    Date for PT Re-Evaluation  09/03/17    PT Start Time  1600    PT Stop Time  1657    PT Time Calculation (min)  57 min    Activity Tolerance  Patient limited by pain;Patient tolerated treatment well    Behavior During Therapy  [facility] for tasks assessed/performed       Past Medical History:  Diagnosis Date  . Anxiety   . Arthritis   . Depression   . 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 GERD (gastroesophageal reflux disease)   . Hiatal hernia   . Muscle cramps    back and upper leg  . Plantar fasciitis   . SOB (shortness of breath)     Past Surgical History:  Procedure Laterality Date  . COLONOSCOPY    . SHOULDER SURGERY  2008   both  . TOTAL KNEE ARTHROPLASTY Right 2015  . TOTAL KNEE REVISION Right 06/16/2017   Procedure: RIGHT TOTAL KNEE REVISION VS. LYSIS OF ADHESIONS AND POLY LINER EXCHANGE;  Surgeon: Kathryne Hitch, MD;  Location: [facility];  Service: Orthopedics;  Laterality: Right;  with block   . UPPER GI ENDOSCOPY    . VENTRAL HERNIA REPAIR  09/2010   with mesh    There were no vitals filed for this visit.  Subjective Assessment - 08/09/17 1600    Subjective  "I am feeling great"    Currently in Pain?  No/denies    Pain Score  0-No pain                       [facility] Adult PT Treatment/Exercise - 08/09/17 0001      Knee/Hip Exercises: Aerobic   Recumbent Bike  seat 9 full rev x4 min     Nustep  L4 x 7 min       Knee/Hip Exercises: Machines for Strengthening   Cybex Knee Extension  5# 3x5    Cybex Knee Flexion  RLE 15lb 2x10    Cybex Leg  Press  20lb 2x15 progressive lowering; some stretching using machine3 x 15''      Knee/Hip Exercises: Supine   Short Arc Quad Sets  Right;2 sets;15 reps      Vasopneumatic   Number Minutes Vasopneumatic   10 minutes    Vasopnuematic Location   Knee    Vasopneumatic Pressure  Medium    Vasopneumatic Temperature   37      Manual Therapy   Manual Therapy  Soft tissue mobilization;Passive ROM    Soft tissue mobilization  R quad     Passive ROM  PROM of the right knee flexion and extension,SOme PROM taken to end range and held               PT Short Term Goals - 07/14/17 1031      PT SHORT TERM GOAL #1   Title  independent with initial HEP    Status  Achieved        PT Long Term Goals - 07/24/17 1438  PT LONG TERM GOAL #2   Title  walk without devie with minimal deviation    Status  On-going      PT LONG TERM GOAL #3   Title  go up and down stairs reciprocally    Status  On-going            Plan - 08/09/17 1650    Clinical Impression Statement  Pt reports calling MD and reporting that MD was able to achieve 105 to 110 degrees of R knee flexion with the manipulation. Pt demos good carryover from last treatment session with mobility. Compensation needed to make full revolutions on bike. Tolerated progressive lowering on leg press well. R knee Motion remains tighten and lifted overall. Pain reported during MT.Pt also guarded at times with MT, requiring cues to relax.    Rehab Potential  Good    PT Frequency  3x / week    PT Duration  8 weeks    PT Treatment/Interventions  ADLs/Self Care Home Management;Cryotherapy;Electrical Stimulation;Gait training;Balance training;Therapeutic exercise;Therapeutic activities;Functional mobility training;Stair training;Patient/family education;Manual techniques;Vasopneumatic Device    PT Next Visit Plan  work on ROM and a better gait pattern       Patient will benefit from skilled therapeutic intervention in order to improve  the following deficits and impairments:  Abnormal gait, Decreased range of motion, Difficulty walking, Increased muscle spasms, Cardiopulmonary status limiting activity, Decreased activity tolerance, Pain, Decreased balance, Impaired flexibility, Increased edema, Decreased strength, Decreased mobility  Visit Diagnosis: Stiffness of right knee, not elsewhere classified  Acute pain of right knee  Localized edema  Difficulty in walking, not elsewhere classified     Problem List Patient Active Problem List   Diagnosis Date Noted  . Status post revision of total knee, right 06/16/2017  . 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 Presence of right artificial knee joint 05/10/2017  . Chronic pain of right knee 05/10/2017  . Plantar fasciitis of right foot 01/26/2015  . Equinus deformity of foot, acquired 01/26/2015  . Rectus diastasis 09/22/2011    Grayce Sessions, PTA 08/09/2017, 4:54 PM  [facility] [address] [place], [place], [address] Phone: [phone]   Fax:  [phone]  Name: NASSIM COSMA MRN: [id number] Date of Birth: 1945-05-31

## 2017-08-09 NOTE — Telephone Encounter (Signed)
I was able to flex his knee to about 105 to 110 degrees.  I only took a photograph of the knee to show his wife that I did flex his knee back, but did not save the picture.

## 2017-08-10 ENCOUNTER — Ambulatory Visit: Payer: Medicare Other | Admitting: Physical Therapy

## 2017-08-11 ENCOUNTER — Ambulatory Visit: Payer: Medicare Other | Admitting: Physical Therapy

## 2017-08-11 ENCOUNTER — Encounter: Payer: Self-pay | Admitting: Physical Therapy

## 2017-08-11 DIAGNOSIS — M25661 Stiffness of right knee, not elsewhere classified: Secondary | ICD-10-CM

## 2017-08-11 DIAGNOSIS — R262 Difficulty in walking, not elsewhere classified: Secondary | ICD-10-CM

## 2017-08-11 DIAGNOSIS — R600 Localized edema: Secondary | ICD-10-CM

## 2017-08-11 DIAGNOSIS — M25561 Pain in right knee: Secondary | ICD-10-CM

## 2017-08-11 NOTE — Therapy (Signed)
[facility] [address] [place], [place], [address] Phone: [phone]   Fax:  [phone]  Physical Therapy Treatment  Patient Details  Name: Tommy Bailey MRN: [id number] Date of Birth: [birth year] Referring Provider: Magnus Ivan   Encounter Date: 08/11/2017  PT End of Session - 08/11/17 1146    Visit Number  14    Date for PT Re-Evaluation  09/03/17    PT Start Time  1053    PT Stop Time  1153    PT Time Calculation (min)  60 min    Activity Tolerance  Patient tolerated treatment well    Behavior During Therapy   [facility] for tasks assessed/performed       Past Medical History:  Diagnosis Date  . Anxiety   . Arthritis   . Depression   . 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 GERD (gastroesophageal reflux disease)   . Hiatal hernia   . Muscle cramps    back and upper leg  . Plantar fasciitis   . SOB (shortness of breath)     Past Surgical History:  Procedure Laterality Date  . COLONOSCOPY    . SHOULDER SURGERY  2008   both  . TOTAL KNEE ARTHROPLASTY Right 2015  . TOTAL KNEE REVISION Right 06/16/2017   Procedure: RIGHT TOTAL KNEE REVISION VS. LYSIS OF ADHESIONS AND POLY LINER EXCHANGE;  Surgeon: Kathryne Hitch, MD;  Location: [facility];  Service: Orthopedics;  Laterality: Right;  with block   . UPPER GI ENDOSCOPY    . VENTRAL HERNIA REPAIR  09/2010   with mesh    There were no vitals filed for this visit.  Subjective Assessment - 08/11/17 1054    Subjective  "ok"    Currently in Pain?  No/denies    Pain Score  0-No pain                       [facility] Adult PT Treatment/Exercise - 08/11/17 0001      Ambulation/Gait   Stairs  Yes    Stair Management Technique  One rail Left;Alternating pattern    Number of Stairs  72    Height of Stairs  6    Gait Comments  Slow when descending with the RLE      Knee/Hip Exercises: Aerobic   Recumbent Bike  seat 9 full rev x4 min      Nustep  L4 x 7 min       Knee/Hip Exercises: Machines for Strengthening   Cybex Knee Extension  5# 3x10    Cybex Knee Flexion  20lb 2x10     Cybex Leg Press  30lb 2x15 progressive lowering; RLE TKE 30lb 2x10      Knee/Hip Exercises: Standing   Forward Step Up  Right;2 sets;10 reps;Hand Hold: 0;Step Height: 6"      Knee/Hip Exercises: Seated   Sit to Sand  2 sets;10 reps;without UE support 2nd set holding yellow ball       Vasopneumatic   Number Minutes Vasopneumatic   10 minutes    Vasopnuematic Location   Knee    Vasopneumatic Pressure  Medium    Vasopneumatic Temperature   32      Manual Therapy   Manual Therapy  Soft tissue mobilization;Passive ROM    Soft tissue mobilization  R quad     Passive ROM  PROM of the right knee flexion and extension,Some PROM  taken to end range and held               PT Short Term Goals - 07/14/17 1031      PT SHORT TERM GOAL #1   Title  independent with initial HEP    Status  Achieved        PT Long Term Goals - 07/24/17 1438      PT LONG TERM GOAL #2   Title  walk without devie with minimal deviation    Status  On-going      PT LONG TERM GOAL #3   Title  go up and down stairs reciprocally    Status  On-going            Plan - 08/11/17 1147    Clinical Impression Statement  Pt enters  clinic with improved gait and, he was flexing more at the R hip an knee more. progressed to stair negotiation, alt pattern one rail R. Descends slow with RLE, R knee ROM is still limited overall. Able to passively push to 98 deg flexion. Pt works hard and give good effort with all exercises.     Rehab Potential  Good    PT Frequency  3x / week    PT Duration  8 weeks    PT Treatment/Interventions  ADLs/Self Care Home Management;Cryotherapy;Electrical Stimulation;Gait training;Balance training;Therapeutic exercise;Therapeutic activities;Functional mobility training;Stair training;Patient/family education;Manual techniques;Vasopneumatic  Device    PT Next Visit Plan  work on ROM and a better gait pattern       Patient will benefit from skilled therapeutic intervention in order to improve the following deficits and impairments:  Abnormal gait, Decreased range of motion, Difficulty walking, Increased muscle spasms, Cardiopulmonary status limiting activity, Decreased activity tolerance, Pain, Decreased balance, Impaired flexibility, Increased edema, Decreased strength, Decreased mobility  Visit Diagnosis: Stiffness of right knee, not elsewhere classified  Localized edema  Difficulty in walking, not elsewhere classified  Acute pain of right knee     Problem List Patient Active Problem List   Diagnosis Date Noted  . Status post revision of total knee, right 06/16/2017  . 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 Presence of right artificial knee joint 05/10/2017  . Chronic pain of right knee 05/10/2017  . Plantar fasciitis of right foot 01/26/2015  . Equinus deformity of foot, acquired 01/26/2015  . Rectus diastasis 09/22/2011    Grayce Sessions, PTA 08/11/2017, 11:49 AM  [facility] [address] [place], [place], [address] Phone: [phone]   Fax:  [phone]  Name: Tommy Bailey MRN: [id number] Date of Birth: 1945/08/24

## 2017-08-15 ENCOUNTER — Ambulatory Visit: Payer: Medicare Other | Admitting: Physical Therapy

## 2017-08-16 ENCOUNTER — Ambulatory Visit (INDEPENDENT_AMBULATORY_CARE_PROVIDER_SITE_OTHER): Payer: Medicare Other

## 2017-08-16 ENCOUNTER — Ambulatory Visit (INDEPENDENT_AMBULATORY_CARE_PROVIDER_SITE_OTHER): Payer: Medicare Other | Admitting: Orthopaedic Surgery

## 2017-08-16 ENCOUNTER — Encounter (INDEPENDENT_AMBULATORY_CARE_PROVIDER_SITE_OTHER): Payer: Self-pay | Admitting: Orthopaedic Surgery

## 2017-08-16 DIAGNOSIS — Z96651 Presence of right artificial knee joint: Secondary | ICD-10-CM | POA: Diagnosis not present

## 2017-08-16 NOTE — Progress Notes (Signed)
The patient is now 2 weeks status post manipulation under anesthesia of the left knee revision arthroplasty.  He reports he is going to physical therapy and they have been able to flex him to about 98 degrees.  This is much better than what it was based on his flexion only 72 degrees before this.  Actually was able to flex him to about 110 in the OR.  On exam he is moving it better overall not getting get him to about 90 degrees today.  X-rays show the implant is intact with no complicating features.  He will continue to push himself through therapy and home exercise program.  We will see him back in a month to see how is doing overall but no x-rays are needed.

## 2017-08-17 ENCOUNTER — Encounter: Payer: Self-pay | Admitting: Physical Therapy

## 2017-08-17 ENCOUNTER — Ambulatory Visit: Payer: Medicare Other | Admitting: Physical Therapy

## 2017-08-17 DIAGNOSIS — M25661 Stiffness of right knee, not elsewhere classified: Secondary | ICD-10-CM | POA: Diagnosis not present

## 2017-08-17 DIAGNOSIS — R262 Difficulty in walking, not elsewhere classified: Secondary | ICD-10-CM

## 2017-08-17 DIAGNOSIS — R600 Localized edema: Secondary | ICD-10-CM

## 2017-08-17 DIAGNOSIS — M25561 Pain in right knee: Secondary | ICD-10-CM

## 2017-08-17 NOTE — Therapy (Signed)
[facility] [address] [place], [place], [address] Phone: [phone]   Fax:  [phone]  Physical Therapy Treatment  Patient Details  Name: Tommy Bailey MRN: [id number] Date of Birth: [birth year] Referring Provider: Magnus Ivan   Encounter Date: 08/17/2017  PT End of Session - 08/17/17 1646    Visit Number  15    Date for PT Re-Evaluation  09/03/17    PT Start Time  1600    PT Stop Time  1652    PT Time Calculation (min)  52 min    Activity Tolerance  Patient tolerated treatment well    Behavior During Therapy  [facility] for tasks assessed/performed       Past Medical History:  Diagnosis Date  . Anxiety   . Arthritis   . Depression   . 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 GERD (gastroesophageal reflux disease)   . Hiatal hernia   . Muscle cramps    back and upper leg  . Plantar fasciitis   . SOB (shortness of breath)     Past Surgical History:  Procedure Laterality Date  . COLONOSCOPY    . SHOULDER SURGERY  2008   both  . TOTAL KNEE ARTHROPLASTY Right 2015  . TOTAL KNEE REVISION Right 06/16/2017   Procedure: RIGHT TOTAL KNEE REVISION VS. LYSIS OF ADHESIONS AND POLY LINER EXCHANGE;  Surgeon: Kathryne Hitch, MD;  Location: [facility];  Service: Orthopedics;  Laterality: Right;  with block   . UPPER GI ENDOSCOPY    . VENTRAL HERNIA REPAIR  09/2010   with mesh    There were no vitals filed for this visit.  Subjective Assessment - 08/17/17 1605    Subjective  "It has actually been going pretty good"    Currently in Pain?  Yes    Pain Score  6     Pain Location  Knee    Pain Orientation  Right    Pain Descriptors / Indicators  Aching                       [facility] Adult PT Treatment/Exercise - 08/17/17 0001      Ambulation/Gait   Stairs  Yes    Stair Management Technique  One rail Left;Alternating pattern    Number of Stairs  72    Height of Stairs  6    Gait  Comments  Slow when desending with the RLE      Knee/Hip Exercises: Aerobic   Recumbent Bike  seat 9 full rev x6 min     Nustep  L4 x 7 min       Knee/Hip Exercises: Machines for Strengthening   Cybex Knee Extension  5# 2x10; LLLDS 10lb 3x10''    Cybex Knee Flexion  20lb 2x10     Cybex Leg Press  RLE 20lb 2x10       Knee/Hip Exercises: Standing   Other Standing Knee Exercises  toe clears 6in RLE x10      Knee/Hip Exercises: Seated   Sit to Sand  2 sets;10 reps;without UE support blue chair      Cryotherapy   Number Minutes Cryotherapy  10 Minutes    Cryotherapy Location  Knee    Type of Cryotherapy  Ice pack      Manual Therapy   Manual Therapy  Soft tissue mobilization;Passive ROM    Soft tissue mobilization  R quad  Passive ROM  PROM of the right knee flexion and extension,SOme PROM taken to end range and held               PT Short Term Goals - 07/14/17 1031      PT SHORT TERM GOAL #1   Title  independent with initial HEP    Status  Achieved        PT Long Term Goals - 07/24/17 1438      PT LONG TERM GOAL #2   Title  walk without devie with minimal deviation    Status  On-going      PT LONG TERM GOAL #3   Title  go up and down stairs reciprocally    Status  On-going            Plan - 08/17/17 1647    Clinical Impression Statement  Pt reports going to the MD Wednesday and he was pleased. Pt also stated that he went to the gym last week when he couldn't get into therapy due to conflicting appointment time with wife. Continues with stair negotiation alternating pattern. Pt amb with stiff RLE he reports decreased elasticity. Stiffness noted with PROM of R knee.     Rehab Potential  Good    PT Frequency  3x / week    PT Duration  8 weeks    PT Treatment/Interventions  ADLs/Self Care Home Management;Cryotherapy;Electrical Stimulation;Gait training;Balance training;Therapeutic exercise;Therapeutic activities;Functional mobility training;Stair  training;Patient/family education;Manual techniques;Vasopneumatic Device    PT Next Visit Plan  work on ROM and a better gait pattern       Patient will benefit from skilled therapeutic intervention in order to improve the following deficits and impairments:  Abnormal gait, Decreased range of motion, Difficulty walking, Increased muscle spasms, Cardiopulmonary status limiting activity, Decreased activity tolerance, Pain, Decreased balance, Impaired flexibility, Increased edema, Decreased strength, Decreased mobility  Visit Diagnosis: Stiffness of right knee, not elsewhere classified  Localized edema  Difficulty in walking, not elsewhere classified  Acute pain of right knee     Problem List Patient Active Problem List   Diagnosis Date Noted  . Status post revision of total knee, right 06/16/2017  . 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 Presence of right artificial knee joint 05/10/2017  . Chronic pain of right knee 05/10/2017  . Plantar fasciitis of right foot 01/26/2015  . Equinus deformity of foot, acquired 01/26/2015  . Rectus diastasis 09/22/2011    Grayce Sessions, PTA 08/17/2017, 4:54 PM  [facility] [address] [place], [place], [address] Phone: [phone]   Fax:  [phone]  Name: Tommy Bailey MRN: [id number] Date of Birth: 05-06-1945

## 2017-08-21 ENCOUNTER — Ambulatory Visit: Payer: Medicare Other | Admitting: Physical Therapy

## 2017-08-21 ENCOUNTER — Encounter: Payer: Self-pay | Admitting: Physical Therapy

## 2017-08-21 DIAGNOSIS — M25661 Stiffness of right knee, not elsewhere classified: Secondary | ICD-10-CM

## 2017-08-21 DIAGNOSIS — R600 Localized edema: Secondary | ICD-10-CM

## 2017-08-21 DIAGNOSIS — M25561 Pain in right knee: Secondary | ICD-10-CM

## 2017-08-21 DIAGNOSIS — R262 Difficulty in walking, not elsewhere classified: Secondary | ICD-10-CM

## 2017-08-21 NOTE — Therapy (Signed)
[place] [place], [place], [address] Phone: [phone]   Fax:  [phone]  Physical Therapy Treatment  Patient Details  Name: Tommy Bailey MRN: [id number] Date of Birth: 05-26-1945 Referring Provider: Ninfa Linden   Encounter Date: 08/21/2017  PT End of Session - 08/21/17 1150    Visit Number  16    Date for PT Re-Evaluation  09/03/17    PT Start Time  1057    PT Stop Time  1146    PT Time Calculation (min)  49 min    Activity Tolerance  Patient tolerated treatment well    Behavior During Therapy  [facility] for tasks assessed/performed       Past Medical History:  Diagnosis Date  . Anxiety   . Arthritis   . Depression   . 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 GERD (gastroesophageal reflux disease)   . Hiatal hernia   . Muscle cramps    back and upper leg  . Plantar fasciitis   . SOB (shortness of breath)     Past Surgical History:  Procedure Laterality Date  . COLONOSCOPY    . SHOULDER SURGERY  2008   both  . TOTAL KNEE ARTHROPLASTY Right 2015  . TOTAL KNEE REVISION Right 06/16/2017   Procedure: RIGHT TOTAL KNEE REVISION VS. LYSIS OF ADHESIONS AND POLY LINER EXCHANGE;  Surgeon: Mcarthur Rossetti, MD;  Location: [facility];  Service: Orthopedics;  Laterality: Right;  with block   . UPPER GI ENDOSCOPY    . VENTRAL HERNIA REPAIR  09/2010   with mesh    There were no vitals filed for this visit.  Subjective Assessment - 08/21/17 1059    Subjective  Patient reports no reall pain right now, biggest c/o is stiffness, reports he tried some kinesio tape on the medial knee and it helped the throbbing at night    Currently in Pain?  Yes    Pain Score  2     Pain Location  Knee    Pain Orientation  Right    Pain Descriptors / Indicators  Tightness    Aggravating Factors   bending                       [facility] Adult PT Treatment/Exercise - 08/21/17 0001      Ambulation/Gait   Gait Comments  ascend descend stairs step over step, did some back ward walking to help with a natural bend      Knee/Hip Exercises: Aerobic   Recumbent Bike  seat 9 full rev x6 min moved seat to 8 and then to 7 as the time progressed    Nustep  L4 x 7 min       Knee/Hip Exercises: Machines for Strengthening   Cybex Leg Press  20# and 60# working on flexion and getting down to posistion 6      Knee/Hip Exercises: Standing   Other Standing Knee Exercises  lunge stretch with foot on mat table      Manual Therapy   Manual Therapy  Soft tissue mobilization;Passive ROM    Soft tissue mobilization  scar and peripatellar area    Passive ROM  PROM of the right knee mostly focused on flexion used mobilization belt and did with movements in standing while patient squatted, then in sitting passively, also some contract relax, tried kneeling and going into flexion for a different variation,  at end used kinesiotape as he found it helpful over the weekend               PT Short Term Goals - 07/14/17 1031      PT [place] #1   Title  independent with initial HEP    Status  Achieved        PT Long Term Goals - 08/21/17 1152      PT LONG TERM GOAL #2   Title  walk without devie with minimal deviation    Status  On-going      PT LONG TERM GOAL #3   Title  go up and down stairs reciprocally    Status  Partially Met      PT LONG TERM GOAL #4   Title  increase AROM to 10-110 degrees flexion    Status  On-going      PT LONG TERM GOAL #5   Title  increase strength to 5/5    Status  On-going            Plan - 08/21/17 1150    Clinical Impression Statement  continues to c/o mostly tightness, reports that the tape helped with his ache and sleeping, the passive motion is limited by tightness, not pain or a hard end feel, he does report "years" of stiffness.  Tried some different ways to gain ROM    PT Next Visit Plan  see if tape does help the pain     Consulted and Agree with Plan of Care  Patient       Patient will benefit from skilled therapeutic intervention in order to improve the following deficits and impairments:  Abnormal gait, Decreased range of motion, Difficulty walking, Increased muscle spasms, Cardiopulmonary status limiting activity, Decreased activity tolerance, Pain, Decreased balance, Impaired flexibility, Increased edema, Decreased strength, Decreased mobility  Visit Diagnosis: Stiffness of right knee, not elsewhere classified  Difficulty in walking, not elsewhere classified  Acute pain of right knee  Localized edema     Problem List Patient Active Problem List   Diagnosis Date Noted  . Status post revision of total knee, right 06/16/2017  . Loose total knee arthroplasty ([place]) 05/15/2017  . Presence of right artificial knee joint 05/10/2017  . Chronic pain of right knee 05/10/2017  . Plantar fasciitis of right foot 01/26/2015  . Equinus deformity of foot, acquired 01/26/2015  . Rectus diastasis 09/22/2011    Sumner Boast., PT 08/21/2017, 11:53 AM  [address] [place] [place], [place], [address] Phone: [phone]   Fax:  [phone]  Name: Tommy Bailey MRN: [id number] Date of Birth: 30-Nov-1945

## 2017-08-23 ENCOUNTER — Encounter: Payer: Self-pay | Admitting: Physical Therapy

## 2017-08-23 ENCOUNTER — Ambulatory Visit: Payer: Medicare Other | Admitting: Physical Therapy

## 2017-08-23 DIAGNOSIS — M25661 Stiffness of right knee, not elsewhere classified: Secondary | ICD-10-CM | POA: Diagnosis not present

## 2017-08-23 DIAGNOSIS — M25561 Pain in right knee: Secondary | ICD-10-CM

## 2017-08-23 DIAGNOSIS — R262 Difficulty in walking, not elsewhere classified: Secondary | ICD-10-CM

## 2017-08-23 DIAGNOSIS — R600 Localized edema: Secondary | ICD-10-CM

## 2017-08-23 NOTE — Therapy (Signed)
[place], [place], [address] Phone: [phone]   Fax:  [phone]  Physical Therapy Treatment  Patient Details  Name: Tommy Bailey MRN: [id number] Date of Birth: 02/16/1946 Referring Provider: Ninfa Linden   Encounter Date: 08/23/2017  PT End of Session - 08/23/17 4650    Visit Number  17    Date for PT Re-Evaluation  09/03/17    PT Start Time  1558    PT Stop Time  1651    PT Time Calculation (min)  53 min       Past Medical History:  Diagnosis Date  . Anxiety   . Arthritis   . Depression   . 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 GERD (gastroesophageal reflux disease)   . Hiatal hernia   . Muscle cramps    back and upper leg  . Plantar fasciitis   . SOB (shortness of breath)     Past Surgical History:  Procedure Laterality Date  . COLONOSCOPY    . SHOULDER SURGERY  2008   both  . TOTAL KNEE ARTHROPLASTY Right 2015  . TOTAL KNEE REVISION Right 06/16/2017   Procedure: RIGHT TOTAL KNEE REVISION VS. LYSIS OF ADHESIONS AND POLY LINER EXCHANGE;  Surgeon: Mcarthur Rossetti, MD;  Location: [facility];  Service: Orthopedics;  Laterality: Right;  with block   . UPPER GI ENDOSCOPY    . VENTRAL HERNIA REPAIR  09/2010   with mesh    There were no vitals filed for this visit.  Subjective Assessment - 08/23/17 1558    Subjective  "I am stiff today"    Currently in Pain?  No/denies Just stiff and ache    Pain Score  0-No pain                       [facility] Adult PT Treatment/Exercise - 08/23/17 0001      Ambulation/Gait   Stairs  Yes    Stair Management Technique  One rail Left;Alternating pattern    Number of Stairs  48    Height of Stairs  6      Knee/Hip Exercises: Aerobic   Recumbent Bike  seat 9 full rev x 5 min moved seat to 8 as the time progressed    Nustep  L4 x 7 min       Knee/Hip Exercises: Machines for Strengthening   Cybex Knee  Extension  10lb RLE 3x5    Cybex Knee Flexion  RLE 20lb 2x10    Cybex Leg Press  40lb 2x15, then 60lb progressive lowering       Knee/Hip Exercises: Standing   Walking with Sports Cord  30lb fwd/rev x5 each    Other Standing Knee Exercises  lunge stretch with foot on 2ns step in stair well      Knee/Hip Exercises: Seated   Sit to Sand  2 sets;10 reps;without UE support With lateral pull to pt R side       Cryotherapy   Number Minutes Cryotherapy  10 Minutes    Cryotherapy Location  Knee    Type of Cryotherapy  Ice pack               PT Short Term Goals - 07/14/17 1031      PT SHORT TERM GOAL #1   Title  independent with initial HEP    Status  Achieved  PT Long Term Goals - 08/21/17 1152      PT LONG TERM GOAL #2   Title  walk without devie with minimal deviation    Status  On-going      PT LONG TERM GOAL #3   Title  go up and down stairs reciprocally    Status  Partially Met      PT LONG TERM GOAL #4   Title  increase AROM to 10-110 degrees flexion    Status  On-going      PT LONG TERM GOAL #5   Title  increase strength to 5/5    Status  On-going            Plan - 08/23/17 1642    Clinical Impression Statement  Enters clinic reporting tightness and some aching. She amb with stiff RLE despite having adequate motion. Good strength on all machines, although he does continues to lack some motion. R knee has some swelling and warmth.     Rehab Potential  Good    PT Frequency  3x / week    PT Duration  8 weeks    PT Treatment/Interventions  ADLs/Self Care Home Management;Cryotherapy;Electrical Stimulation;Gait training;Balance training;Therapeutic exercise;Therapeutic activities;Functional mobility training;Stair training;Patient/family education;Manual techniques;Vasopneumatic Device    PT Next Visit Plan  Continue tape it if helps, manage swelling and improve R knee ROM       Patient will benefit from skilled therapeutic intervention in order to  improve the following deficits and impairments:  Abnormal gait, Decreased range of motion, Difficulty walking, Increased muscle spasms, Cardiopulmonary status limiting activity, Decreased activity tolerance, Pain, Decreased balance, Impaired flexibility, Increased edema, Decreased strength, Decreased mobility  Visit Diagnosis: Stiffness of right knee, not elsewhere classified  Difficulty in walking, not elsewhere classified  Acute pain of right knee  Localized edema     Problem List Patient Active Problem List   Diagnosis Date Noted  . Status post revision of total knee, right 06/16/2017  . Loose total knee arthroplasty ([place]) 05/15/2017  . Presence of right artificial knee joint 05/10/2017  . Chronic pain of right knee 05/10/2017  . Plantar fasciitis of right foot 01/26/2015  . Equinus deformity of foot, acquired 01/26/2015  . Rectus diastasis 09/22/2011    Scot Jun, PTA 08/23/2017, 4:45 PM  [place], [place], [address] Phone: [phone]   Fax:  [phone]  Name: Tommy Bailey MRN: [id number] Date of Birth: 1945/06/07

## 2017-08-25 ENCOUNTER — Ambulatory Visit: Payer: Medicare Other | Admitting: Physical Therapy

## 2017-08-25 ENCOUNTER — Encounter: Payer: Self-pay | Admitting: Physical Therapy

## 2017-08-25 DIAGNOSIS — M25561 Pain in right knee: Secondary | ICD-10-CM

## 2017-08-25 DIAGNOSIS — M25661 Stiffness of right knee, not elsewhere classified: Secondary | ICD-10-CM | POA: Diagnosis not present

## 2017-08-25 DIAGNOSIS — R600 Localized edema: Secondary | ICD-10-CM

## 2017-08-25 DIAGNOSIS — R262 Difficulty in walking, not elsewhere classified: Secondary | ICD-10-CM

## 2017-08-25 NOTE — Therapy (Signed)
[place], [place], [address] Phone: [phone]   Fax:  [phone]  Physical Therapy Treatment  Patient Details  Name: Tommy Bailey MRN: [id number] Date of Birth: 01-14-1946 Referring Provider: Ninfa Linden   Encounter Date: 08/25/2017  PT End of Session - 08/25/17 1054    Visit Number  18    Date for PT Re-Evaluation  09/03/17    PT Start Time  3818    PT Stop Time  1110    PT Time Calculation (min)  55 min    Activity Tolerance  Patient tolerated treatment well    Behavior During Therapy  [facility] for tasks assessed/performed       Past Medical History:  Diagnosis Date  . Anxiety   . Arthritis   . Depression   . 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 GERD (gastroesophageal reflux disease)   . Hiatal hernia   . Muscle cramps    back and upper leg  . Plantar fasciitis   . SOB (shortness of breath)     Past Surgical History:  Procedure Laterality Date  . COLONOSCOPY    . SHOULDER SURGERY  2008   both  . TOTAL KNEE ARTHROPLASTY Right 2015  . TOTAL KNEE REVISION Right 06/16/2017   Procedure: RIGHT TOTAL KNEE REVISION VS. LYSIS OF ADHESIONS AND POLY LINER EXCHANGE;  Surgeon: Mcarthur Rossetti, MD;  Location: [facility];  Service: Orthopedics;  Laterality: Right;  with block   . UPPER GI ENDOSCOPY    . VENTRAL HERNIA REPAIR  09/2010   with mesh    There were no vitals filed for this visit.  Subjective Assessment - 08/25/17 1015    Subjective  "I had a rough night" Pt reports aching and throbbing on both sides of R knee    Currently in Pain?  Yes    Pain Score  5     Pain Location  Knee    Pain Orientation  Right    Pain Descriptors / Indicators  Aching                       [facility] Adult PT Treatment/Exercise - 08/25/17 0001      Knee/Hip Exercises: Aerobic   Recumbent Bike  seat 9 full rev x 5 min moved seat to 8 as the time progressed    Nustep   L4 x 7 min       Knee/Hip Exercises: Machines for Strengthening   Cybex Knee Extension  10lb RLE 3x5    Cybex Knee Flexion  RLE 20lb 2x15    Cybex Leg Press  40lb 10 touches on level 6 then 5 touches on level 5       Knee/Hip Exercises: Standing   Heel Raises  Both;2 sets;15 reps;2 seconds    Forward Step Up  Right;10 reps;Step Height: 6";3 sets;Hand Hold: 1 2 sets with green Tband resistance     Walking with Sports Cord  40lb with 6 in step up 2x5      Knee/Hip Exercises: Seated   Sit to Sand  2 sets;15 reps;without UE support holding yellow ball LLE slightly foward       Vasopneumatic   Number Minutes Vasopneumatic   10 minutes    Vasopnuematic Location   Knee    Vasopneumatic Pressure  Medium    Vasopneumatic Temperature   32  PT Short Term Goals - 07/14/17 1031      PT SHORT TERM GOAL #1   Title  independent with initial HEP    Status  Achieved        PT Long Term Goals - 08/21/17 1152      PT LONG TERM GOAL #2   Title  walk without devie with minimal deviation    Status  On-going      PT LONG TERM GOAL #3   Title  go up and down stairs reciprocally    Status  Partially Met      PT LONG TERM GOAL #4   Title  increase AROM to 10-110 degrees flexion    Status  On-going      PT LONG TERM GOAL #5   Title  increase strength to 5/5    Status  On-going            Plan - 08/25/17 1055    Clinical Impression Statement  Enters clinic reporting increase fatigue due to lack of sleep. Rigid RLE when he ambulates due to subjective reports of stiffness. Increase ROM on leg press with progressive lowering. Good stability ans strength with resisted gait and step ups.     Rehab Potential  Good    PT Frequency  3x / week    PT Duration  8 weeks    PT Treatment/Interventions  ADLs/Self Care Home Management;Cryotherapy;Electrical Stimulation;Gait training;Balance training;Therapeutic exercise;Therapeutic activities;Functional mobility training;Stair  training;Patient/family education;Manual techniques;Vasopneumatic Device    PT Next Visit Plan  Continue tape it if helps, manage swelling and improve R knee ROM       Patient will benefit from skilled therapeutic intervention in order to improve the following deficits and impairments:  Abnormal gait, Decreased range of motion, Difficulty walking, Increased muscle spasms, Cardiopulmonary status limiting activity, Decreased activity tolerance, Pain, Decreased balance, Impaired flexibility, Increased edema, Decreased strength, Decreased mobility  Visit Diagnosis: Stiffness of right knee, not elsewhere classified  Localized edema  Difficulty in walking, not elsewhere classified  Acute pain of right knee     Problem List Patient Active Problem List   Diagnosis Date Noted  . Status post revision of total knee, right 06/16/2017  . Loose total knee arthroplasty ([place]) 05/15/2017  . Presence of right artificial knee joint 05/10/2017  . Chronic pain of right knee 05/10/2017  . Plantar fasciitis of right foot 01/26/2015  . Equinus deformity of foot, acquired 01/26/2015  . Rectus diastasis 09/22/2011    Scot Jun, PTA 08/25/2017, 10:58 AM  [place], [place], [address] Phone: [phone]   Fax:  [phone]  Name: Tommy Bailey MRN: [id number] Date of Birth: 1945/09/24

## 2017-08-29 ENCOUNTER — Ambulatory Visit: Payer: Medicare Other | Admitting: Physical Therapy

## 2017-08-29 ENCOUNTER — Encounter: Payer: Self-pay | Admitting: Physical Therapy

## 2017-08-29 DIAGNOSIS — M25661 Stiffness of right knee, not elsewhere classified: Secondary | ICD-10-CM | POA: Diagnosis not present

## 2017-08-29 DIAGNOSIS — R262 Difficulty in walking, not elsewhere classified: Secondary | ICD-10-CM

## 2017-08-29 DIAGNOSIS — R600 Localized edema: Secondary | ICD-10-CM

## 2017-08-29 DIAGNOSIS — M25561 Pain in right knee: Secondary | ICD-10-CM

## 2017-08-29 NOTE — Therapy (Signed)
[place], [place], [address] Phone: [phone]   Fax:  [phone]  Physical Therapy Treatment  Patient Details  Name: Tommy Bailey MRN: [id number] Date of Birth: [birth year] Referring Provider: Ninfa Linden   Encounter Date: 08/29/2017  PT End of Session - 08/29/17 1013    Visit Number  19    Date for PT Re-Evaluation  09/03/17    PT Start Time  0927    PT Stop Time  1029    PT Time Calculation (min)  62 min    Activity Tolerance  Patient tolerated treatment well    Behavior During Therapy  [facility] for tasks assessed/performed       Past Medical History:  Diagnosis Date  . Anxiety   . Arthritis   . Depression   . 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 GERD (gastroesophageal reflux disease)   . Hiatal hernia   . Muscle cramps    back and upper leg  . Plantar fasciitis   . SOB (shortness of breath)     Past Surgical History:  Procedure Laterality Date  . COLONOSCOPY    . SHOULDER SURGERY  2008   both  . TOTAL KNEE ARTHROPLASTY Right 2015  . TOTAL KNEE REVISION Right 06/16/2017   Procedure: RIGHT TOTAL KNEE REVISION VS. LYSIS OF ADHESIONS AND POLY LINER EXCHANGE;  Surgeon: Mcarthur Rossetti, MD;  Location: [facility];  Service: Orthopedics;  Laterality: Right;  with block   . UPPER GI ENDOSCOPY    . VENTRAL HERNIA REPAIR  09/2010   with mesh    There were no vitals filed for this visit.  Subjective Assessment - 08/29/17 0931    Subjective  Patient continues to report high rating of pain, reports that he has been active.  He reports that pain is mostly at night,    Currently in Pain?  Yes    Pain Score  4     Pain Location  Knee    Pain Orientation  Right;Medial    Pain Descriptors / Indicators  Aching    Aggravating Factors   at night pain an 8/10                       [facility] Adult PT Treatment/Exercise - 08/29/17 0001      Ambulation/Gait   Gait Comments  around building, up and down slope      Knee/Hip Exercises: Aerobic   Elliptical  R=6, I =10 x 4 minutes    Recumbent Bike  seat 9 full rev x 6 min moved seat to 8 as the time progressed      Knee/Hip Exercises: Machines for Strengthening   Cybex Leg Press  30# working on going lower and lower, started at position 7 and worked down to [employer] 5.        Knee/Hip Exercises: Supine   Short Arc Quad Sets  3 sets;Right;10 reps;Limitations    Short Arc Quad Sets Limitations  cues for TKE with 3# weight    Other Supine Knee/Hip Exercises  feet on ball bridge and K2C with some over pressure      Modalities   Modalities  Electrical Stimulation;Cryotherapy;Ultrasound      Cryotherapy   Number Minutes Cryotherapy  10 Minutes    Cryotherapy Location  Knee    Type of Cryotherapy  Ice pack      Electrical Stimulation  Electrical Stimulation Location  right medial knee    Electrical Stimulation Action  IFC    Electrical Stimulation Parameters  supine     Electrical Stimulation Goals  Edema;Pain      Ultrasound   Ultrasound Location  right medial knee    Ultrasound Parameters  1.4w/cm 2 100%    Ultrasound Goals  Edema;Pain               PT Short Term Goals - 07/14/17 1031      PT SHORT TERM GOAL #1   Title  independent with initial HEP    Status  Achieved        PT Long Term Goals - 08/21/17 1152      PT LONG TERM GOAL #2   Title  walk without devie with minimal deviation    Status  On-going      PT LONG TERM GOAL #3   Title  go up and down stairs reciprocally    Status  Partially Met      PT LONG TERM GOAL #4   Title  increase AROM to 10-110 degrees flexion    Status  On-going      PT LONG TERM GOAL #5   Title  increase strength to 5/5    Status  On-going            Plan - 08/29/17 1013    Clinical Impression Statement  Patient continues to report pain at night, reports RLS at times, c/o medial knee pain, able to do position 5 on leg  press.  Does well with his ROM with the elliptical and is able to do full revolutions on the bike, however he still will walk with toe out gait and with stiff leg if he is not cued    PT Next Visit Plan  tried modailities to see if we could decrease pain, if works will try again if not focus on ROM and function    Consulted and Agree with Plan of Care  Patient       Patient will benefit from skilled therapeutic intervention in order to improve the following deficits and impairments:  Abnormal gait, Decreased range of motion, Difficulty walking, Increased muscle spasms, Cardiopulmonary status limiting activity, Decreased activity tolerance, Pain, Decreased balance, Impaired flexibility, Increased edema, Decreased strength, Decreased mobility  Visit Diagnosis: Stiffness of right knee, not elsewhere classified  Localized edema  Difficulty in walking, not elsewhere classified  Acute pain of right knee     Problem List Patient Active Problem List   Diagnosis Date Noted  . Status post revision of total knee, right 06/16/2017  . Loose total knee arthroplasty ([place]) 05/15/2017  . Presence of right artificial knee joint 05/10/2017  . Chronic pain of right knee 05/10/2017  . Plantar fasciitis of right foot 01/26/2015  . Equinus deformity of foot, acquired 01/26/2015  . Rectus diastasis 09/22/2011    Sumner Boast., PT 08/29/2017, 10:16 AM  [place], [place], [address] Phone: [phone]   Fax:  [phone]  Name: DYER KLUG MRN: [id number] Date of Birth: [birth year]

## 2017-08-31 ENCOUNTER — Encounter: Payer: Self-pay | Admitting: Physical Therapy

## 2017-08-31 ENCOUNTER — Ambulatory Visit: Payer: Medicare Other | Admitting: Physical Therapy

## 2017-08-31 DIAGNOSIS — R600 Localized edema: Secondary | ICD-10-CM

## 2017-08-31 DIAGNOSIS — R262 Difficulty in walking, not elsewhere classified: Secondary | ICD-10-CM

## 2017-08-31 DIAGNOSIS — M25661 Stiffness of right knee, not elsewhere classified: Secondary | ICD-10-CM

## 2017-08-31 DIAGNOSIS — M25561 Pain in right knee: Secondary | ICD-10-CM

## 2017-08-31 NOTE — Therapy (Signed)
[place], [place], [address] Phone: [phone]   Fax:  [phone]  Physical Therapy Treatment Progress Note Reporting Period  08/01/17 to 08/31/17 visits 10-20  See note below for Objective Data and Assessment of Progress/Goals.      Patient Details  Name: Tommy Bailey MRN: [id number] Date of Birth: 1946-03-01 Referring Provider: Ninfa Linden   Encounter Date: 08/31/2017  PT End of Session - 08/31/17 0928    Visit Number  20    Date for PT Re-Evaluation  09/03/17    PT Start Time  0840    PT Stop Time  0940    PT Time Calculation (min)  60 min    Activity Tolerance  Patient tolerated treatment well    Behavior During Therapy  [facility] for tasks assessed/performed       Past Medical History:  Diagnosis Date  . Anxiety   . Arthritis   . Depression   . 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 GERD (gastroesophageal reflux disease)   . Hiatal hernia   . Muscle cramps    back and upper leg  . Plantar fasciitis   . SOB (shortness of breath)     Past Surgical History:  Procedure Laterality Date  . COLONOSCOPY    . SHOULDER SURGERY  2008   both  . TOTAL KNEE ARTHROPLASTY Right 2015  . TOTAL KNEE REVISION Right 06/16/2017   Procedure: RIGHT TOTAL KNEE REVISION VS. LYSIS OF ADHESIONS AND POLY LINER EXCHANGE;  Surgeon: Mcarthur Rossetti, MD;  Location: [facility];  Service: Orthopedics;  Laterality: Right;  with block   . UPPER GI ENDOSCOPY    . VENTRAL HERNIA REPAIR  09/2010   with mesh    There were no vitals filed for this visit.  Subjective Assessment - 08/31/17 0850    Subjective  Patient reports that after the [place] and estim he did not have as much pain    Currently in Pain?  Yes    Pain Score  2     Pain Location  Knee    Pain Orientation  Right;Medial         [facility] PT Assessment - 08/31/17 0001      PROM   Right Knee Flexion  101                    [facility] Adult PT Treatment/Exercise - 08/31/17 0001      Knee/Hip Exercises: Aerobic   Elliptical  R=6, I =10 x 4 minutes    Recumbent Bike  seat 9 full rev x 6 min moved seat to 8 as the time progressed      Knee/Hip Exercises: Machines for Strengthening   Cybex Leg Press  30# working on going lower and lower, started at position 7 and worked down to [employer] 5.   Single leg position 5 no weight and with 20#      Knee/Hip Exercises: Standing   Other Standing Knee Exercises  squat at sink holding knees on cabinet door and going down to box and trying to stay, some PT help with decreaseing compensation      Knee/Hip Exercises: Supine   Short Arc Quad Sets  3 sets;Right;10 reps;Limitations      Cryotherapy   Number Minutes Cryotherapy  10 Minutes    Cryotherapy Location  Knee    Type of Cryotherapy  Ice pack  Acupuncturist Location  right medial knee    Electrical Stimulation Action  IFC    Electrical Stimulation Parameters  supine    Electrical Stimulation Goals  Pain      Ultrasound   Ultrasound Location  right scar area of the knee    Ultrasound Parameters  1.4 w/cm2 100%    Ultrasound Goals  Edema;Pain               PT Short Term Goals - 07/14/17 1031      PT SHORT TERM GOAL #1   Title  independent with initial HEP    Status  Achieved        PT Long Term Goals - 08/21/17 1152      PT LONG TERM GOAL #2   Title  walk without devie with minimal deviation    Status  On-going      PT LONG TERM GOAL #3   Title  go up and down stairs reciprocally    Status  Partially Met      PT LONG TERM GOAL #4   Title  increase AROM to 10-110 degrees flexion    Status  On-going      PT LONG TERM GOAL #5   Title  increase strength to 5/5    Status  On-going            Plan - 08/31/17 0942    Clinical Impression Statement  Patient continues with the stiffness, he reports that he went years without  bending the knee.  He had better ROM today and seemed to have less difficulty and pain with pushing the ROM    PT Next Visit Plan  tried modailities to see if we could decrease pain, if works will try again if not focus on ROM and function    Consulted and Agree with Plan of Care  Patient       Patient will benefit from skilled therapeutic intervention in order to improve the following deficits and impairments:  Abnormal gait, Decreased range of motion, Difficulty walking, Increased muscle spasms, Cardiopulmonary status limiting activity, Decreased activity tolerance, Pain, Decreased balance, Impaired flexibility, Increased edema, Decreased strength, Decreased mobility  Visit Diagnosis: Stiffness of right knee, not elsewhere classified  Localized edema  Difficulty in walking, not elsewhere classified  Acute pain of right knee     Problem List Patient Active Problem List   Diagnosis Date Noted  . Status post revision of total knee, right 06/16/2017  . Loose total knee arthroplasty ([place]) 05/15/2017  . Presence of right artificial knee joint 05/10/2017  . Chronic pain of right knee 05/10/2017  . Plantar fasciitis of right foot 01/26/2015  . Equinus deformity of foot, acquired 01/26/2015  . Rectus diastasis 09/22/2011    Sumner Boast., PT 08/31/2017, 10:13 AM  [place], [place], [address] Phone: [phone]   Fax:  [phone]  Name: Tommy Bailey MRN: [id number] Date of Birth: 17-Mar-1946

## 2017-09-04 ENCOUNTER — Encounter: Payer: Self-pay | Admitting: Physical Therapy

## 2017-09-04 ENCOUNTER — Ambulatory Visit: Payer: Medicare Other | Attending: Orthopaedic Surgery | Admitting: Physical Therapy

## 2017-09-04 DIAGNOSIS — M25661 Stiffness of right knee, not elsewhere classified: Secondary | ICD-10-CM | POA: Diagnosis present

## 2017-09-04 DIAGNOSIS — R262 Difficulty in walking, not elsewhere classified: Secondary | ICD-10-CM | POA: Diagnosis present

## 2017-09-04 DIAGNOSIS — M25561 Pain in right knee: Secondary | ICD-10-CM | POA: Insufficient documentation

## 2017-09-04 DIAGNOSIS — R600 Localized edema: Secondary | ICD-10-CM | POA: Diagnosis not present

## 2017-09-04 NOTE — Therapy (Signed)
[place], [place], [address] Phone: [phone]   Fax:  [phone]  Physical Therapy Treatment  Patient Details  Name: Tommy Bailey MRN: [id number] Date of Birth: Mar 09, 1946 Referring Provider: Ninfa Linden   Encounter Date: 09/04/2017  PT End of Session - 09/04/17 1345    Visit Number  21    Date for PT Re-Evaluation  09/03/17    PT Start Time  1300    PT Stop Time  1400    PT Time Calculation (min)  60 min    Activity Tolerance  Patient tolerated treatment well    Behavior During Therapy  [facility] for tasks assessed/performed       Past Medical History:  Diagnosis Date  . Anxiety   . Arthritis   . Depression   . 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 GERD (gastroesophageal reflux disease)   . Hiatal hernia   . Muscle cramps    back and upper leg  . Plantar fasciitis   . SOB (shortness of breath)     Past Surgical History:  Procedure Laterality Date  . COLONOSCOPY    . SHOULDER SURGERY  2008   both  . TOTAL KNEE ARTHROPLASTY Right 2015  . TOTAL KNEE REVISION Right 06/16/2017   Procedure: RIGHT TOTAL KNEE REVISION VS. LYSIS OF ADHESIONS AND POLY LINER EXCHANGE;  Surgeon: Mcarthur Rossetti, MD;  Location: [facility];  Service: Orthopedics;  Laterality: Right;  with block   . UPPER GI ENDOSCOPY    . VENTRAL HERNIA REPAIR  09/2010   with mesh    There were no vitals filed for this visit.  Subjective Assessment - 09/04/17 1259    Subjective  Pt still reports some pain and stiffness,     Currently in Pain?  Yes    Pain Score  4     Pain Location  Leg    Pain Orientation  Right;Distal    Pain Descriptors / Indicators  Aching                       [facility] Adult PT Treatment/Exercise - 09/04/17 0001      Ambulation/Gait   Gait Comments  2 flights alternating pattern       Knee/Hip Exercises: Aerobic   Elliptical  R=6, I =10 x 4 minutes    Recumbent  Bike  seat 9 full rev x 6 min moved seat to 8 as the time progressed      Knee/Hip Exercises: Machines for Strengthening   Cybex Leg Press  30# working on going lower and lower, started at position 7 and worked down to [employer] 3.       Knee/Hip Exercises: Standing   Walking with Sports Cord  40lb with 8 in step up 2x5; 30lb side step with 6in step up x5 each      Knee/Hip Exercises: Seated   Sit to Sand  2 sets;15 reps;without UE support holding yellow ball       Modalities   Modalities  Vasopneumatic      Vasopneumatic   Number Minutes Vasopneumatic   10 minutes    Vasopnuematic Location   Knee    Vasopneumatic Pressure  Medium    Vasopneumatic Temperature   32               PT Short Term Goals - 07/14/17 1031  PT SHORT TERM GOAL #1   Title  independent with initial HEP    Status  Achieved        PT Long Term Goals - 08/21/17 1152      PT LONG TERM GOAL #2   Title  walk without devie with minimal deviation    Status  On-going      PT LONG TERM GOAL #3   Title  go up and down stairs reciprocally    Status  Partially Met      PT LONG TERM GOAL #4   Title  increase AROM to 10-110 degrees flexion    Status  On-going      PT LONG TERM GOAL #5   Title  increase strength to 5/5    Status  On-going            Plan - 09/04/17 1345    Clinical Impression Statement  Pt enters reporting stiffness in RLE, PT did really well progressing with ROM on leg press. Alternating pattern with one rail negotiating stairs under supervision assist. Good stability with resisted gait. Pt still ambulated with Stiff RLE.     Rehab Potential  Good    PT Frequency  3x / week    PT Duration  8 weeks    PT Treatment/Interventions  ADLs/Self Care Home Management;Cryotherapy;Electrical Stimulation;Gait training;Balance training;Therapeutic exercise;Therapeutic activities;Functional mobility training;Stair training;Patient/family education;Manual techniques;Vasopneumatic Device     PT Next Visit Plan  focus on ROM and function       Patient will benefit from skilled therapeutic intervention in order to improve the following deficits and impairments:  Abnormal gait, Decreased range of motion, Difficulty walking, Increased muscle spasms, Cardiopulmonary status limiting activity, Decreased activity tolerance, Pain, Decreased balance, Impaired flexibility, Increased edema, Decreased strength, Decreased mobility  Visit Diagnosis: Localized edema  Stiffness of right knee, not elsewhere classified  Difficulty in walking, not elsewhere classified  Acute pain of right knee     Problem List Patient Active Problem List   Diagnosis Date Noted  . Status post revision of total knee, right 06/16/2017  . Loose total knee arthroplasty ([place]) 05/15/2017  . Presence of right artificial knee joint 05/10/2017  . Chronic pain of right knee 05/10/2017  . Plantar fasciitis of right foot 01/26/2015  . Equinus deformity of foot, acquired 01/26/2015  . Rectus diastasis 09/22/2011    Scot Jun, PTA 09/04/2017, 1:47 PM  [place], [place], [address] Phone: [phone]   Fax:  [phone]  Name: KARLIS CREGG MRN: [id number] Date of Birth: Aug 30, 1945

## 2017-09-06 ENCOUNTER — Ambulatory Visit: Payer: Medicare Other | Admitting: Physical Therapy

## 2017-09-06 ENCOUNTER — Encounter: Payer: Self-pay | Admitting: Physical Therapy

## 2017-09-06 DIAGNOSIS — R600 Localized edema: Secondary | ICD-10-CM

## 2017-09-06 DIAGNOSIS — R262 Difficulty in walking, not elsewhere classified: Secondary | ICD-10-CM

## 2017-09-06 DIAGNOSIS — M25561 Pain in right knee: Secondary | ICD-10-CM

## 2017-09-06 DIAGNOSIS — M25661 Stiffness of right knee, not elsewhere classified: Secondary | ICD-10-CM

## 2017-09-06 NOTE — Therapy (Signed)
[place], [place], [address] Phone: [phone]   Fax:  [phone]  Physical Therapy Treatment  Patient Details  Name: Tommy Bailey MRN: [id number] Date of Birth: [birth year] Referring Provider: Ninfa Linden   Encounter Date: 09/06/2017  PT End of Session - 09/06/17 1342    Visit Number  22    Date for PT Re-Evaluation  09/03/17    PT Start Time  1300    PT Stop Time  1351    PT Time Calculation (min)  51 min    Activity Tolerance  Patient tolerated treatment well    Behavior During Therapy  [facility] for tasks assessed/performed       Past Medical History:  Diagnosis Date  . Anxiety   . Arthritis   . Depression   . 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 GERD (gastroesophageal reflux disease)   . Hiatal hernia   . Muscle cramps    back and upper leg  . Plantar fasciitis   . SOB (shortness of breath)     Past Surgical History:  Procedure Laterality Date  . COLONOSCOPY    . SHOULDER SURGERY  2008   both  . TOTAL KNEE ARTHROPLASTY Right 2015  . TOTAL KNEE REVISION Right 06/16/2017   Procedure: RIGHT TOTAL KNEE REVISION VS. LYSIS OF ADHESIONS AND POLY LINER EXCHANGE;  Surgeon: Mcarthur Rossetti, MD;  [facility];  Service: Orthopedics;  Laterality: Right;  with block   . UPPER GI ENDOSCOPY    . VENTRAL HERNIA REPAIR  09/2010   with mesh    There were no vitals filed for this visit.  Subjective Assessment - 09/06/17 1300    Subjective  "Just the normal stiffness, A little bit of pain when I try to sleep"    Currently in Pain?  Yes    Pain Score  4     Pain Location  Knee    Pain Orientation  Right                       [facility] Adult PT Treatment/Exercise - 09/06/17 0001      Ambulation/Gait   Gait Comments  1 flight alternating pattern then back down. Gait uphill decrease hip and knee flexion with toe off RLE       Knee/Hip Exercises:  Aerobic   Elliptical  R=5, I =15 x 4 minutes    Recumbent Bike  seat 8 full rev x 6 min moved seat to 8 as the time progressed      Knee/Hip Exercises: Machines for Strengthening   Cybex Knee Extension  10lb RLE 2x10    Cybex Knee Flexion  RLE 20lb 2x15    Cybex Leg Press  30# working on going lower and lower, started at position 6 and worked down to [employer] 3.       Knee/Hip Exercises: Seated   Sit to Sand  2 sets;15 reps;without UE support yellow ball       Cryotherapy   Number Minutes Cryotherapy  10 Minutes    Cryotherapy Location  Knee    Type of Cryotherapy  Ice pack               PT Short Term Goals - 07/14/17 1031      PT SHORT TERM GOAL #1   Title  independent with initial HEP    Status  Achieved  PT Long Term Goals - 08/21/17 1152      PT LONG TERM GOAL #2   Title  walk without devie with minimal deviation    Status  On-going      PT LONG TERM GOAL #3   Title  go up and down stairs reciprocally    Status  Partially Met      PT LONG TERM GOAL #4   Title  increase AROM to 10-110 degrees flexion    Status  On-going      PT LONG TERM GOAL #5   Title  increase strength to 5/5    Status  On-going            Plan - 09/06/17 1342    Clinical Impression Statement  Pt with decrease hip and knee flexion with to off and swing phase or RLE. He has good ROM for appropriate gait bet walks really stiff legged. Continues to progress with ROM on leg press. Good SL strength with extensions    Rehab Potential  Good    PT Frequency  3x / week    PT Duration  8 weeks    PT Treatment/Interventions  ADLs/Self Care Home Management;Cryotherapy;Electrical Stimulation;Gait training;Balance training;Therapeutic exercise;Therapeutic activities;Functional mobility training;Stair training;Patient/family education;Manual techniques;Vasopneumatic Device    PT Next Visit Plan  focus on ROM and function       Patient will benefit from skilled therapeutic  intervention in order to improve the following deficits and impairments:  Abnormal gait, Decreased range of motion, Difficulty walking, Increased muscle spasms, Cardiopulmonary status limiting activity, Decreased activity tolerance, Pain, Decreased balance, Impaired flexibility, Increased edema, Decreased strength, Decreased mobility  Visit Diagnosis: Localized edema  Stiffness of right knee, not elsewhere classified  Acute pain of right knee  Difficulty in walking, not elsewhere classified     Problem List Patient Active Problem List   Diagnosis Date Noted  . Status post revision of total knee, right 06/16/2017  . Loose total knee arthroplasty ([place]) 05/15/2017  . Presence of right artificial knee joint 05/10/2017  . Chronic pain of right knee 05/10/2017  . Plantar fasciitis of right foot 01/26/2015  . Equinus deformity of foot, acquired 01/26/2015  . Rectus diastasis 09/22/2011    Scot Jun, PTA 09/06/2017, 1:45 PM  [place], [place], [address] Phone: [phone]   Fax:  [phone]  Name: VALERIA KRISKO MRN: [id number] Date of Birth: 25-Mar-1946

## 2017-09-08 ENCOUNTER — Ambulatory Visit: Payer: Medicare Other | Admitting: Physical Therapy

## 2017-09-08 ENCOUNTER — Encounter: Payer: Self-pay | Admitting: Physical Therapy

## 2017-09-08 DIAGNOSIS — M25661 Stiffness of right knee, not elsewhere classified: Secondary | ICD-10-CM

## 2017-09-08 DIAGNOSIS — M25561 Pain in right knee: Secondary | ICD-10-CM

## 2017-09-08 DIAGNOSIS — R600 Localized edema: Secondary | ICD-10-CM | POA: Diagnosis not present

## 2017-09-08 DIAGNOSIS — R262 Difficulty in walking, not elsewhere classified: Secondary | ICD-10-CM

## 2017-09-08 NOTE — Therapy (Signed)
[place], [place], [address] Phone: [phone]   Fax:  [phone]  Physical Therapy Treatment  Patient Details  Name: Tommy Bailey MRN: [id number] Date of Birth: 08-25-1945 Referring Provider: Ninfa Linden   Encounter Date: 09/08/2017  PT End of Session - 09/08/17 1134    Visit Number  23    Date for PT Re-Evaluation  09/03/17    PT Start Time  1055    PT Stop Time  1136    PT Time Calculation (min)  41 min    Activity Tolerance  Patient tolerated treatment well    Behavior During Therapy  [facility] for tasks assessed/performed       Past Medical History:  Diagnosis Date  . Anxiety   . Arthritis   . Depression   . 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 GERD (gastroesophageal reflux disease)   . Hiatal hernia   . Muscle cramps    back and upper leg  . Plantar fasciitis   . SOB (shortness of breath)     Past Surgical History:  Procedure Laterality Date  . COLONOSCOPY    . SHOULDER SURGERY  2008   both  . TOTAL KNEE ARTHROPLASTY Right 2015  . TOTAL KNEE REVISION Right 06/16/2017   Procedure: RIGHT TOTAL KNEE REVISION VS. LYSIS OF ADHESIONS AND POLY LINER EXCHANGE;  Surgeon: Mcarthur Rossetti, MD;  Location: [facility];  Service: Orthopedics;  Laterality: Right;  with block   . UPPER GI ENDOSCOPY    . VENTRAL HERNIA REPAIR  09/2010   with mesh    There were no vitals filed for this visit.  Subjective Assessment - 09/08/17 1059    Subjective  "It is going good, just the usual complaints"    Currently in Pain?  Yes    Pain Score  1     Pain Location  Knee    Pain Orientation  Right                       [facility] Adult PT Treatment/Exercise - 09/08/17 0001      Knee/Hip Exercises: Aerobic   Elliptical  R=5, I =15 x 4 minutes    Nustep  L4 x 7 no ue       Knee/Hip Exercises: Machines for Strengthening   Cybex Knee Extension  10lb RLE 3x10    Cybex  Knee Flexion  RLE 20lb 2x15    Cybex Leg Press  40lb 10 level 6, 5, then 4. 20lb RLE level 6 then 5       Knee/Hip Exercises: Standing   Heel Raises  Both;2 sets;15 reps;2 seconds    Forward Step Up  Right;2 sets;10 reps;Hand Hold: 0      Knee/Hip Exercises: Seated   Sit to Sand  10 reps;without UE support;3 sets LE on airex, 2 sets holding yellow ball.               PT Short Term Goals - 07/14/17 1031      PT SHORT TERM GOAL #1   Title  independent with initial HEP    Status  Achieved        PT Long Term Goals - 08/21/17 1152      PT LONG TERM GOAL #2   Title  walk without devie with minimal deviation    Status  On-going      PT LONG  TERM GOAL #3   Title  go up and down stairs reciprocally    Status  Partially Met      PT LONG TERM GOAL #4   Title  increase AROM to 10-110 degrees flexion    Status  On-going      PT LONG TERM GOAL #5   Title  increase strength to 5/5    Status  On-going            Plan - 09/08/17 1134    Clinical Impression Statement  Pt continues to do well with strength and ROM. Weakness noted with SL on leg press from lower angle. Good stability with resisted gait and with sit to stands on non compliant surfaces. Pt still ambulated with stiff R knee.     Rehab Potential  Good    PT Frequency  3x / week    PT Duration  4 weeks    PT Treatment/Interventions  ADLs/Self Care Home Management;Cryotherapy;Electrical Stimulation;Gait training;Balance training;Therapeutic exercise;Therapeutic activities;Functional mobility training;Stair training;Patient/family education;Manual techniques;Vasopneumatic Device    PT Next Visit Plan  focus on ROM and function       Patient will benefit from skilled therapeutic intervention in order to improve the following deficits and impairments:  Abnormal gait, Decreased range of motion, Difficulty walking, Increased muscle spasms, Cardiopulmonary status limiting activity, Decreased activity tolerance, Pain,  Decreased balance, Impaired flexibility, Increased edema, Decreased strength, Decreased mobility  Visit Diagnosis: Localized edema  Stiffness of right knee, not elsewhere classified  Acute pain of right knee  Difficulty in walking, not elsewhere classified     Problem List Patient Active Problem List   Diagnosis Date Noted  . Status post revision of total knee, right 06/16/2017  . 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 Presence of right artificial knee joint 05/10/2017  . Chronic pain of right knee 05/10/2017  . Plantar fasciitis of right foot 01/26/2015  . Equinus deformity of foot, acquired 01/26/2015  . Rectus diastasis 09/22/2011    Ronald G Pemberton, PTA 09/08/2017, 11:37 AM  [facility] [address] [place], [place], [address] Phone: [phone]   Fax:  [phone]  Name: Tommy Bailey MRN: [id number] Date of Birth: 05/30/1945   

## 2017-09-11 ENCOUNTER — Encounter: Payer: Self-pay | Admitting: Physical Therapy

## 2017-09-11 ENCOUNTER — Ambulatory Visit: Payer: Medicare Other | Admitting: Physical Therapy

## 2017-09-11 DIAGNOSIS — R600 Localized edema: Secondary | ICD-10-CM

## 2017-09-11 DIAGNOSIS — M25661 Stiffness of right knee, not elsewhere classified: Secondary | ICD-10-CM

## 2017-09-11 DIAGNOSIS — M25561 Pain in right knee: Secondary | ICD-10-CM

## 2017-09-11 DIAGNOSIS — R262 Difficulty in walking, not elsewhere classified: Secondary | ICD-10-CM

## 2017-09-11 NOTE — Therapy (Signed)
[place], [place], [address] Phone: [phone]   Fax:  [phone]  Physical Therapy Treatment  Patient Details  Name: Tommy Bailey MRN: [id number] Date of Birth: 03/25/1946 Referring Provider: Ninfa Linden   Encounter Date: 09/11/2017  PT End of Session - 09/11/17 1429    Visit Number  24    Date for PT Re-Evaluation  09/03/17    PT Start Time  1345    PT Stop Time  1445    PT Time Calculation (min)  60 min    Activity Tolerance  Patient tolerated treatment well    Behavior During Therapy  [facility] for tasks assessed/performed       Past Medical History:  Diagnosis Date  . Anxiety   . Arthritis   . Depression   . 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 GERD (gastroesophageal reflux disease)   . Hiatal hernia   . Muscle cramps    back and upper leg  . Plantar fasciitis   . SOB (shortness of breath)     Past Surgical History:  Procedure Laterality Date  . COLONOSCOPY    . SHOULDER SURGERY  2008   both  . TOTAL KNEE ARTHROPLASTY Right 2015  . TOTAL KNEE REVISION Right 06/16/2017   Procedure: RIGHT TOTAL KNEE REVISION VS. LYSIS OF ADHESIONS AND POLY LINER EXCHANGE;  Surgeon: Mcarthur Rossetti, MD;  Location: [facility];  Service: Orthopedics;  Laterality: Right;  with block   . UPPER GI ENDOSCOPY    . VENTRAL HERNIA REPAIR  09/2010   with mesh    There were no vitals filed for this visit.  Subjective Assessment - 09/11/17 1351    Subjective  "No pain just stiffness"    Currently in Pain?  No/denies    Pain Score  0-No pain                       [facility] Adult PT Treatment/Exercise - 09/11/17 0001      Ambulation/Gait   Gait Comments  2 flights alternating pattern      Knee/Hip Exercises: Aerobic   Elliptical  R=5, I =15 x 4 minutes    Recumbent Bike  seat 8 full rev x 4 min seat 7      Knee/Hip Exercises: Machines for Strengthening   Cybex Knee  Extension  10lb RLE 2x15    Cybex Knee Flexion  RLE 20lb 2x15    Cybex Leg Press  40lb X10 level 6, 5, 4, then 3. 20lb RLE level 6 then 5       Knee/Hip Exercises: Standing   Other Standing Knee Exercises  controlled descents 6in 2x10       Knee/Hip Exercises: Seated   Sit to Sand  10 reps;without UE support;2 sets from blue chair standing on airex       Vasopneumatic   Number Minutes Vasopneumatic   15 minutes    Vasopnuematic Location   Knee    Vasopneumatic Pressure  Medium    Vasopneumatic Temperature   32               PT Short Term Goals - 07/14/17 1031      PT SHORT TERM GOAL #1   Title  independent with initial HEP    Status  Achieved        PT Long Term Goals - 08/21/17 1152  PT LONG TERM GOAL #2   Title  walk without devie with minimal deviation    Status  On-going      PT LONG TERM GOAL #3   Title  go up and down stairs reciprocally    Status  Partially Met      PT LONG TERM GOAL #4   Title  increase AROM to 10-110 degrees flexion    Status  On-going      PT LONG TERM GOAL #5   Title  increase strength to 5/5    Status  On-going            Plan - 09/11/17 1430    Clinical Impression Statement  Pt continues to display good strength and motion and machine interventions. He still ambulated with a stiff RLE. Pt reports increase quad fatigue after today's interventions.     PT Frequency  3x / week    PT Duration  4 weeks    PT Treatment/Interventions  ADLs/Self Care Home Management;Cryotherapy;Electrical Stimulation;Gait training;Balance training;Therapeutic exercise;Therapeutic activities;Functional mobility training;Stair training;Patient/family education;Manual techniques;Vasopneumatic Device    PT Next Visit Plan  focus on ROM and function       Patient will benefit from skilled therapeutic intervention in order to improve the following deficits and impairments:  Abnormal gait, Decreased range of motion, Difficulty walking, Increased  muscle spasms, Cardiopulmonary status limiting activity, Decreased activity tolerance, Pain, Decreased balance, Impaired flexibility, Increased edema, Decreased strength, Decreased mobility  Visit Diagnosis: Stiffness of right knee, not elsewhere classified  Localized edema  Acute pain of right knee  Difficulty in walking, not elsewhere classified     Problem List Patient Active Problem List   Diagnosis Date Noted  . Status post revision of total knee, right 06/16/2017  . Loose total knee arthroplasty ([place]) 05/15/2017  . Presence of right artificial knee joint 05/10/2017  . Chronic pain of right knee 05/10/2017  . Plantar fasciitis of right foot 01/26/2015  . Equinus deformity of foot, acquired 01/26/2015  . Rectus diastasis 09/22/2011    Scot Jun, PTA 09/11/2017, 2:35 PM  [place], [place], [address] Phone: [phone]   Fax:  [phone]  Name: Tommy Bailey MRN: [id number] Date of Birth: September 28, 1945

## 2017-09-14 ENCOUNTER — Ambulatory Visit: Payer: Medicare Other | Admitting: Physical Therapy

## 2017-09-14 ENCOUNTER — Encounter: Payer: Self-pay | Admitting: Physical Therapy

## 2017-09-14 DIAGNOSIS — M25661 Stiffness of right knee, not elsewhere classified: Secondary | ICD-10-CM

## 2017-09-14 DIAGNOSIS — R262 Difficulty in walking, not elsewhere classified: Secondary | ICD-10-CM

## 2017-09-14 DIAGNOSIS — R600 Localized edema: Secondary | ICD-10-CM

## 2017-09-14 DIAGNOSIS — M25561 Pain in right knee: Secondary | ICD-10-CM

## 2017-09-14 NOTE — Therapy (Signed)
[place], [place], [address] Phone: [phone]   Fax:  [phone]  Physical Therapy Treatment  Patient Details  Name: Tommy Bailey MRN: [id number] Date of Birth: 25-Jul-1945 Referring Provider: Ninfa Linden   Encounter Date: 09/14/2017  PT End of Session - 09/14/17 1427    Visit Number  25    PT Start Time  1694    PT Stop Time  1427    PT Time Calculation (min)  42 min    Activity Tolerance  Patient tolerated treatment well    Behavior During Therapy  [facility] for tasks assessed/performed       Past Medical History:  Diagnosis Date  . Anxiety   . Arthritis   . Depression   . 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 GERD (gastroesophageal reflux disease)   . Hiatal hernia   . Muscle cramps    back and upper leg  . Plantar fasciitis   . SOB (shortness of breath)     Past Surgical History:  Procedure Laterality Date  . COLONOSCOPY    . SHOULDER SURGERY  2008   both  . TOTAL KNEE ARTHROPLASTY Right 2015  . TOTAL KNEE REVISION Right 06/16/2017   Procedure: RIGHT TOTAL KNEE REVISION VS. LYSIS OF ADHESIONS AND POLY LINER EXCHANGE;  Surgeon: Mcarthur Rossetti, MD;  Location: [facility];  Service: Orthopedics;  Laterality: Right;  with block   . UPPER GI ENDOSCOPY    . VENTRAL HERNIA REPAIR  09/2010   with mesh    There were no vitals filed for this visit.  Subjective Assessment - 09/14/17 1350    Subjective  "Good"    Currently in Pain?  No/denies    Pain Score  0-No pain         [facility] PT Assessment - 09/14/17 0001      ROM / Strength   AROM / PROM / Strength  Strength      PROM   Right Knee Extension  6    Right Knee Flexion  96      Strength   Overall Strength  Within functional limits for tasks performed    Overall Strength Comments  R knee 5/5    Strength Assessment Site  Knee                   [facility] Adult PT Treatment/Exercise - 09/14/17  0001      Ambulation/Gait   Gait Comments  1 flight alternating pattern then back down. Gait uphill decrease hip and knee flexion with toe off RLE       Knee/Hip Exercises: Aerobic   Elliptical  R=7, I =15 x 4 minutes    Recumbent Bike  seat 8 full rev x 6 min seat 7      Knee/Hip Exercises: Machines for Strengthening   Cybex Knee Extension  10lb RLE 2x15    Cybex Knee Flexion  RLE 20lb 2x15    Cybex Leg Press  50lb x10 level 5, 4, then 3. 20lb RLE level 6 then 5  109 deg R kneee flex on level 3      Knee/Hip Exercises: Seated   Sit to Sand  2 sets;15 reps;without UE support yellow ball       Manual Therapy   Manual Therapy  Passive ROM    Soft tissue mobilization  R knee flexion  PT Short Term Goals - 07/14/17 1031      PT SHORT TERM GOAL #1   Title  independent with initial HEP    Status  Achieved        PT Long Term Goals - 09/14/17 1428      PT LONG TERM GOAL #1   Title  independent with RICE    Status  Achieved      PT LONG TERM GOAL #2   Title  walk without devie with minimal deviation    Status  Partially Met      PT LONG TERM GOAL #3   Title  go up and down stairs reciprocally    Status  Achieved      PT LONG TERM GOAL #4   Title  increase AROM to 10-110 degrees flexion    Status  Partially Met      PT LONG TERM GOAL #5   Title  increase strength to 5/5    Status  Partially Met            Plan - 09/14/17 1429    Clinical Impression Statement  Pt continues to do well, despite some limitation with knee flexion. Pt tolerated all of today's activities well wit no reports of increase pain. He reports no limitations at home, He stated that he is moving soon and would like today to be his last day    Rehab Potential  Good    PT Frequency  3x / week    PT Duration  4 weeks    PT Next Visit Plan  Pt request D/C       Patient will benefit from skilled therapeutic intervention in order to improve the following deficits and  impairments:  Abnormal gait, Decreased range of motion, Difficulty walking, Increased muscle spasms, Cardiopulmonary status limiting activity, Decreased activity tolerance, Pain, Decreased balance, Impaired flexibility, Increased edema, Decreased strength, Decreased mobility  Visit Diagnosis: Stiffness of right knee, not elsewhere classified  Localized edema  Difficulty in walking, not elsewhere classified  Acute pain of right knee     Problem List Patient Active Problem List   Diagnosis Date Noted  . Status post revision of total knee, right 06/16/2017  . Loose total knee arthroplasty ([place]) 05/15/2017  . Presence of right artificial knee joint 05/10/2017  . Chronic pain of right knee 05/10/2017  . Plantar fasciitis of right foot 01/26/2015  . Equinus deformity of foot, acquired 01/26/2015  . Rectus diastasis 09/22/2011    Scot Jun, PTA 09/14/2017, 2:33 PM  [place], [place], [address] Phone: [phone]   Fax:  [phone]  Name: Tommy Bailey MRN: [id number] Date of Birth: 03-Jul-1945

## 2017-09-20 ENCOUNTER — Ambulatory Visit (INDEPENDENT_AMBULATORY_CARE_PROVIDER_SITE_OTHER): Payer: Medicare Other | Admitting: Orthopaedic Surgery

## 2017-09-20 ENCOUNTER — Encounter (INDEPENDENT_AMBULATORY_CARE_PROVIDER_SITE_OTHER): Payer: Self-pay | Admitting: Orthopaedic Surgery

## 2017-09-20 DIAGNOSIS — Z96651 Presence of right artificial knee joint: Secondary | ICD-10-CM

## 2017-09-20 NOTE — Progress Notes (Signed)
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  [place].  He is doing well overall with his knee and reports increased range of motion increase strength and less stiffness.  He says doing much better than what he was before.  On exam he has full extension of his right knee.  I can only flex him to about 95 degrees with this is much better than what he was prior to the right knee revision.  The knee feels ligaments is stable as well.  His calf is soft.  There is some swelling postoperatively be expected but this should go down with time.  All questions concerns were answered and addressed.  Since he is doing so well and [place] [facility] [place] follow-up will be as needed.

## 2019-04-06 IMAGING — DX DG KNEE 1-2V PORT*R*
2 series · 2 of 2 positions shown · non-contrast
Comparison: Right knee x-rays dated April 26, 2017.

CLINICAL DATA: Right total knee arthroplasty revision.

EXAM:
PORTABLE RIGHT KNEE - 1-2 VIEW

[knee ap]
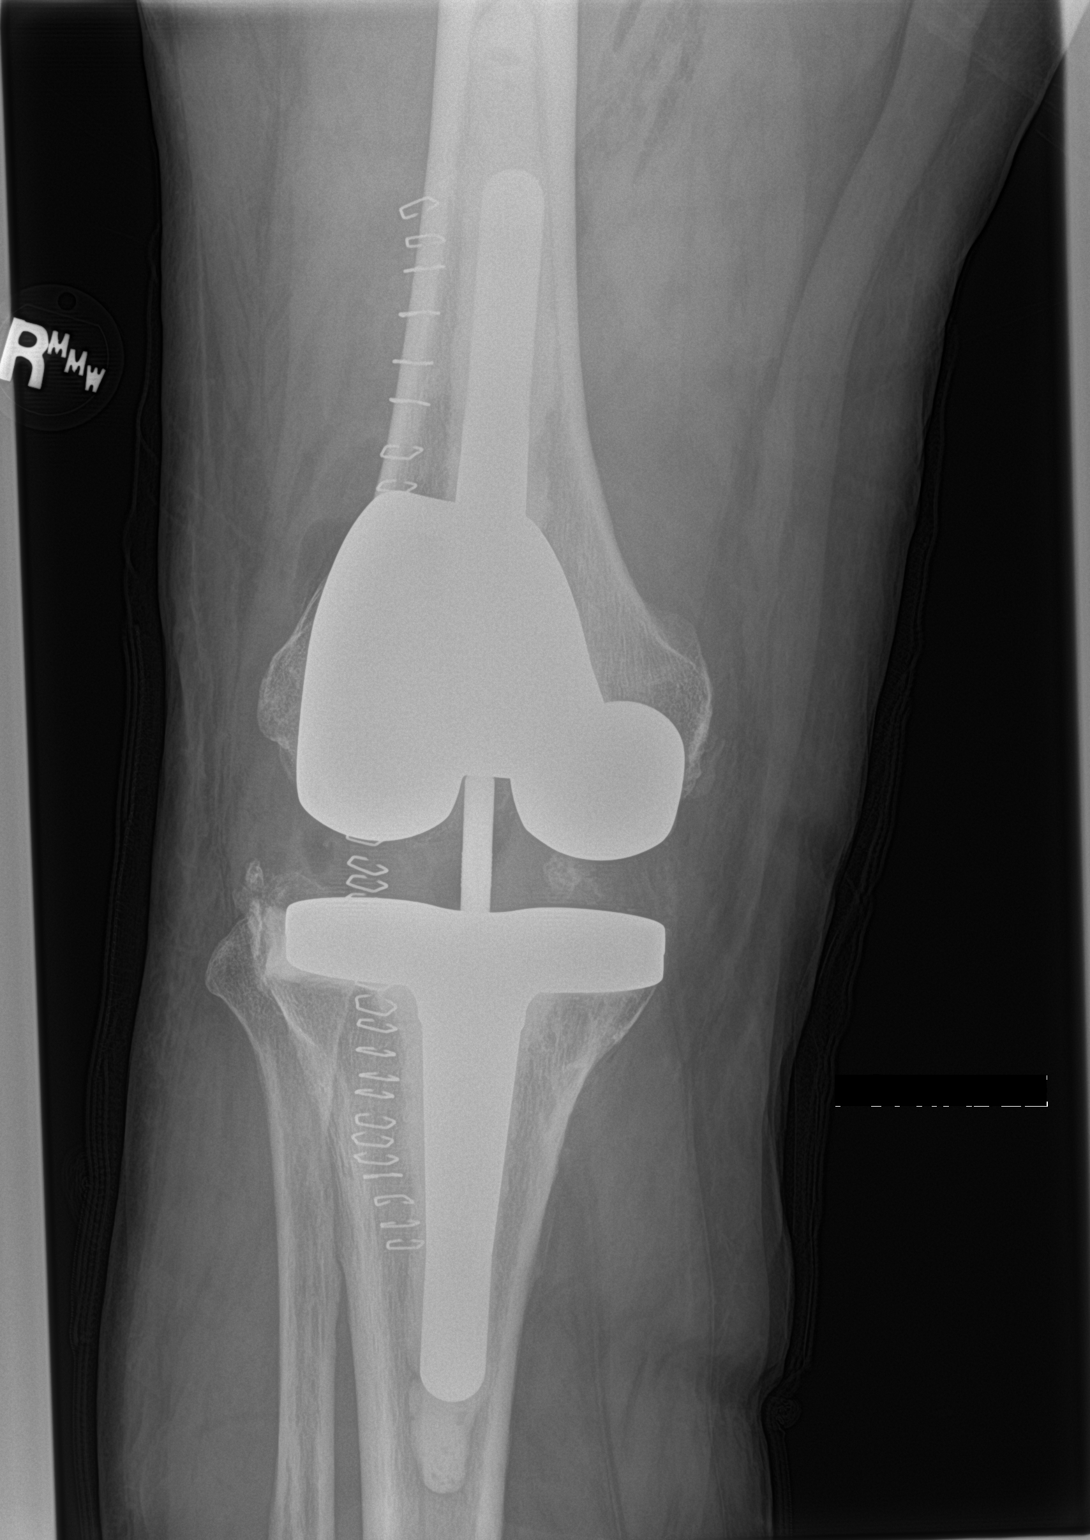

[knee lat]
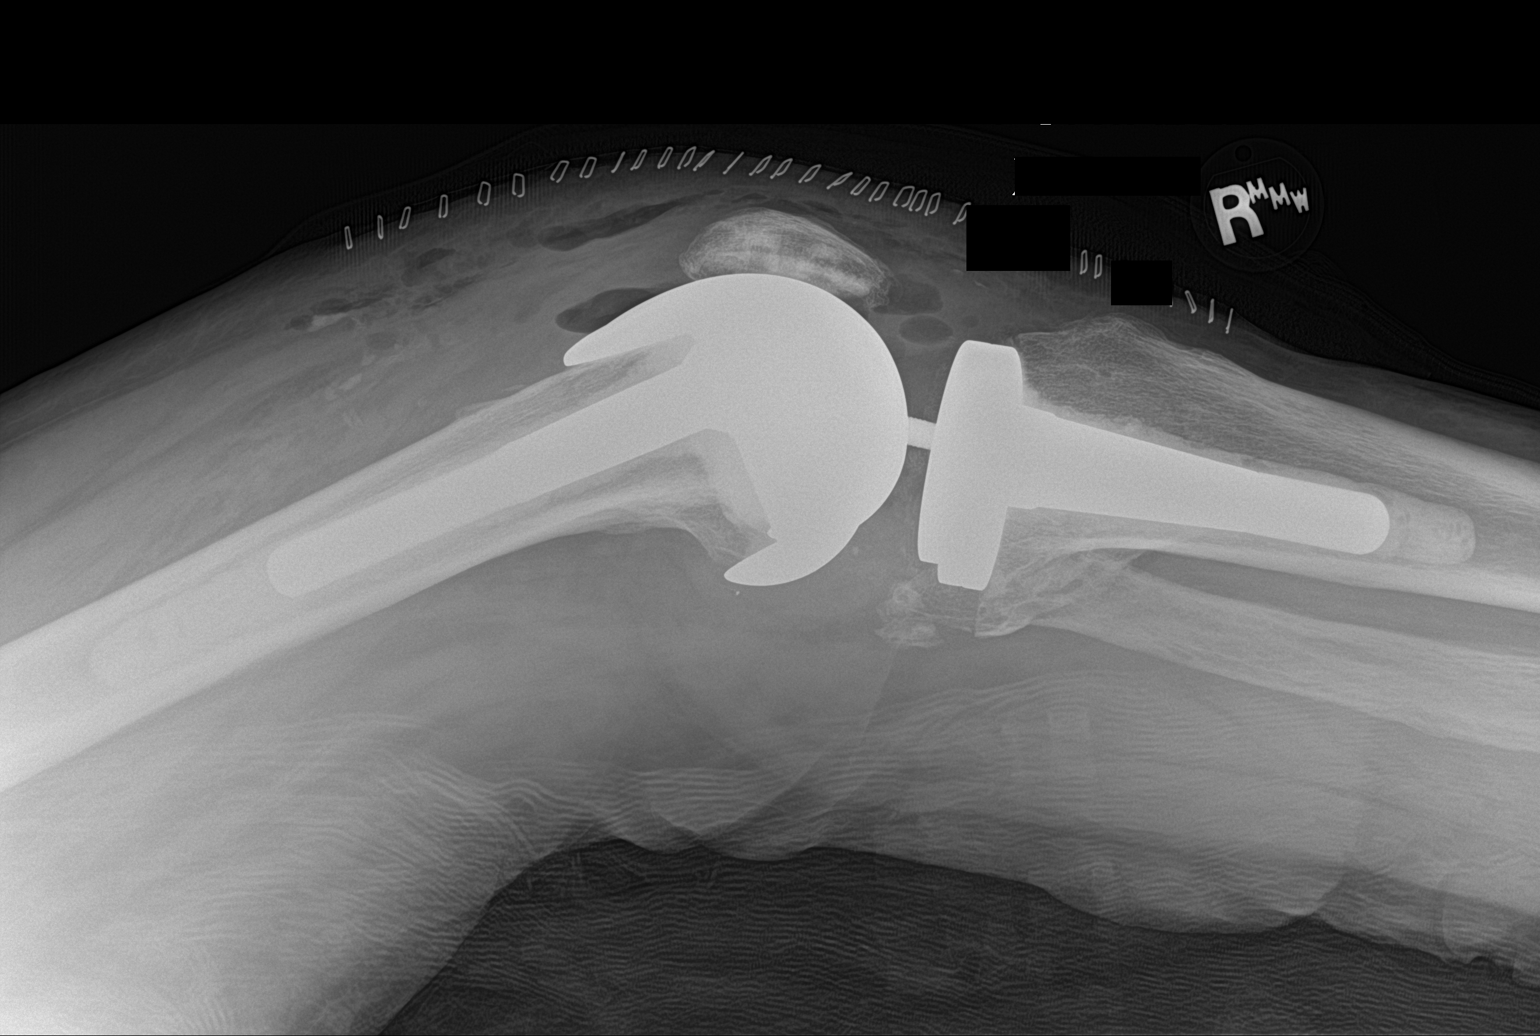

[2 of 2 positions shown; findings below may reference images not displayed]

FINDINGS: The right knee demonstrates a total knee arthroplasty revision with
new constrained arthroplasty. No evidence of hardware failure or
complication. There is expected intra-articular air. There is no
fracture or dislocation. The alignment is anatomic. Post-surgical
changes noted in the surrounding soft tissues.
IMPRESSION: 1. Interval right total knee arthroplasty revision without evidence
of acute postoperative complication.

## 2020-02-12 ENCOUNTER — Telehealth: Payer: Self-pay | Admitting: Orthopaedic Surgery

## 2020-02-12 NOTE — Telephone Encounter (Signed)
No antibiotics are needed for dental visits from my standpoint.

## 2020-02-12 NOTE — Telephone Encounter (Signed)
Pt called and just had dental work done and would like to know if he needs to take amoxal prior to dental work ??   If so can he have a letter made for him to take to his dentist.

## 2020-02-12 NOTE — Telephone Encounter (Signed)
Pt called and advised. He needs a letter email to   [email or web address]  I informed pt we would email a letter over

## 2020-02-12 NOTE — Telephone Encounter (Signed)
This isnt neeeded anymore, correct?

## 2020-02-12 NOTE — Telephone Encounter (Signed)
Note in chart. Do you mind emailing it for me?

## 2020-02-13 NOTE — Telephone Encounter (Signed)
done

## 2021-07-01 LAB — COLOGUARD TEST, EXTERNAL: Cologuard Test, External: NEGATIVE

## 2021-07-02 LAB — FECAL DNA COLORECTAL CANCER SCREENING (COLOGUARD): FIT-DNA (Cologuard): NEGATIVE

## 2021-11-17 ENCOUNTER — Ambulatory Visit: Admit: 2021-11-17 | Discharge: 2021-11-17 | Payer: MEDICARE | Attending: Family Medicine | Primary: Family Medicine

## 2021-11-17 ENCOUNTER — Encounter

## 2021-11-17 DIAGNOSIS — E1165 Type 2 diabetes mellitus with hyperglycemia: Secondary | ICD-10-CM

## 2021-11-17 LAB — AMB POC HEMOGLOBIN A1C: Hemoglobin A1C, POC: 5.8 %

## 2021-11-17 MED ORDER — FAMOTIDINE 20 MG PO TABS
20 MG | ORAL_TABLET | Freq: Two times a day (BID) | ORAL | 1 refills | Status: DC
Start: 2021-11-17 — End: 2022-07-02

## 2021-11-17 MED ORDER — ZOSTER VAC RECOMB ADJUVANTED 50 MCG/0.5ML IM SUSR
50 MCG/0.5ML | Freq: Once | INTRAMUSCULAR | 0 refills | Status: AC
Start: 2021-11-17 — End: 2021-11-17

## 2021-11-17 NOTE — Progress Notes (Unsigned)
Assessment/Plan:     Diagnoses and all orders for this visit:    1. Stage 3a chronic kidney disease (CKD) (HCC)  ***    2. Controlled type 2 diabetes mellitus with hyperglycemia, without long-term current use of insulin (HCC)  ***        No follow-ups on file.       Discussed expected course/resolution/complications of diagnosis in detail with patient.    Medication risks/benefits/costs/interactions/alternatives discussed with patient.    Pt expressed understanding with the diagnosis and plan      Subjective:      CC  Patrick Welch is a 76 y.o. male who presents for had no chief complaint listed for this encounter.       HPI  Diabetes  He presents for his follow-up diabetic visit. He has type 2 diabetes mellitus. Pertinent negatives for hypoglycemia include no dizziness, headaches or nervousness/anxiousness. Pertinent negatives for diabetes include no chest pain.     No current outpatient medications on file.     No current facility-administered medications for this visit.       Not on File  No past medical history on file.   No past surgical history on file.   No family history on file.   Social History     Socioeconomic History    Marital status: Married     Spouse name: Not on file    Number of children: Not on file    Years of education: Not on file    Highest education level: Not on file   Occupational History    Not on file   Tobacco Use    Smoking status: Not on file    Smokeless tobacco: Not on file   Substance and Sexual Activity    Alcohol use: Not on file    Drug use: Not on file    Sexual activity: Not on file   Other Topics Concern    Not on file   Social History Narrative    Not on file     Social Determinants of Health     Financial Resource Strain: Not on file   Food Insecurity: Not on file   Transportation Needs: Not on file   Physical Activity: Not on file   Stress: Not on file   Social Connections: Not on file   Intimate Partner Violence: Not on file   Housing Stability: Not on file          ROS:    Review of Systems   Constitutional:  Negative for chills and fever.   HENT:  Negative for hearing loss and tinnitus.    Eyes:  Negative for visual disturbance.   Respiratory:  Negative for cough and shortness of breath.    Cardiovascular:  Negative for chest pain and palpitations.   Gastrointestinal:  Negative for nausea and vomiting.   Genitourinary:  Negative for dysuria and frequency.   Musculoskeletal:  Negative for back pain.   Skin:  Negative for rash.   Allergic/Immunologic: Negative for environmental allergies.   Neurological:  Negative for dizziness, syncope and headaches.   Psychiatric/Behavioral:  Negative for dysphoric mood. The patient is not nervous/anxious.        Objective:   There were no vitals taken for this visit.       Vitals and Nurse Documentation reviewed.     Physical Exam  Constitutional:       General: He is not in acute distress.     Appearance: Normal  appearance. He is not ill-appearing.   HENT:      Head: Normocephalic and atraumatic.   Eyes:      Conjunctiva/sclera: Conjunctivae normal.   Cardiovascular:      Rate and Rhythm: Normal rate and regular rhythm.      Heart sounds: Normal heart sounds. No murmur heard.    No friction rub. No gallop.   Pulmonary:      Effort: Pulmonary effort is normal. No respiratory distress.      Breath sounds: Normal breath sounds. No wheezing, rhonchi or rales.   Abdominal:      General: Bowel sounds are normal. There is no distension.      Palpations: Abdomen is soft.      Tenderness: There is no abdominal tenderness. There is no guarding or rebound.   Musculoskeletal:      Cervical back: Neck supple.      Right lower leg: No edema.      Left lower leg: No edema.   Neurological:      Mental Status: He is alert and oriented to person, place, and time.      Gait: Gait normal.       No results found for this visit on 11/17/21.

## 2021-11-17 NOTE — Patient Instructions (Signed)
Learning About Mindfulness for Stress  What are mindfulness and stress?     Stress is your body's response to a hard situation. Your body can have a physical, emotional, or mental response. A lot of things can cause stress. You may feel stress when you go on a job interview, take a test, or run a race. This kind of short-term stress is normal and even useful. It can help you if you need to work hard or react quickly.  Stress also can last a long time. Long-term stress is caused by stressful situations or events. Examples of long-term stress include long-term health problems, ongoing problems at work, and conflicts in your family. Long-term stress can harm your health.  Mindfulness is a focus only on things happening in the present moment. It's a process of purposefully paying attention to and being aware of your surroundings, your emotions, your thoughts, and how your body feels. You are aware of these things, but you aren't judging these experiences as "good" or "bad." Mindfulness can help you learn to calm your mind and body to help you cope with illness, pain, and stress.  How does mindfulness help to relieve stress?  Mindfulness can help quiet your mind and relax your body. Studies show that it can help some people sleep better, feel less anxious, and bring their blood pressure down. And it's been shown to help some people live and cope better with certain health problems like heart disease, depression, chronic pain, and cancer.  How do you practice mindfulness?  To be mindful is to pay attention, to be present, and to be accepting. Like any new skill or habit, being mindful can take practice.  When you're mindful, you do just one thing and you pay close attention to that one thing. For example, you may sit quietly and notice your emotions or how your food tastes and smells.  When you're present, you focus on the things that are happening right now. You let go of your thoughts about the past and the future.  When you dwell on the past or the future, you miss moments that can heal and strengthen you. You may miss moments like hearing a child laugh or seeing a friendly face when you think you're all alone.  When you're accepting, you don't judge the present moment. Instead you accept your thoughts and feelings as they come.  You can practice anytime, anywhere, and in any way you choose. You can practice in many ways. Here are a few ideas:  While doing your chores, like washing the dishes, let your mind focus on what's in your hand. What does the dish feel like? Is the water warm or cold?  Go outside and take a few deep breaths. What is the air like? Is it warm or cold?  When you can, take some time at the start of your day to sit alone and think.  Take a slow walk by yourself. Count your steps while you breathe in and out.  Try yoga breathing exercises, stretches, and poses to strengthen and relax your muscles.  At work, if you can, try to stop for a few moments each hour. Note how your body feels. Let yourself regroup and let your mind [place] before you return to what you were doing.  If you struggle with anxiety or "worry thoughts," imagine your mind as a blue sky and your worry thoughts as clouds. Now imagine those worry thoughts floating across your mind's sky. Just let them pass  by as you watch.  Follow-up care is a key part of your treatment and safety. Be sure to make and go to all appointments, and call your doctor if you are having problems. It's also a good idea to know your test results and keep a list of the medicines you take.  Where can you learn more?  Go to https://www.bennett.info/ and enter M676 to learn more about "Learning About Mindfulness for Stress."  Current as of: October 20, 2022Content Version: 13.7   2006-2023 Healthwise, Incorporated.   Care instructions adapted under license by [facility]. If you have questions about a medical condition or this instruction, always  ask your healthcare professional. [place] any warranty or liability for your use of this information.           Fatigue: Care Instructions  Your Care Instructions     Fatigue is a feeling of tiredness, exhaustion, or lack of energy. You may feel fatigue because of too much or not enough activity. It can also come from stress, lack of sleep, boredom, and poor diet. Many medical problems, such as viral infections, can cause fatigue. Emotional problems, especially depression, are often the cause of fatigue.  Fatigue is most often a symptom of another problem. Treatment for fatigue depends on the cause. For example, if you have fatigue because you have a certain health problem, treating this problem also treats your fatigue. If depression or anxiety is the cause, treatment may help.  Follow-up care is a key part of your treatment and safety. Be sure to make and go to all appointments, and call your doctor if you are having problems. It's also a good idea to know your test results and keep a list of the medicines you take.  How can you care for yourself at home?  Get regular exercise. But don't overdo it. Go back and forth between rest and exercise.  Get plenty of rest.  Eat a healthy diet. Do not skip meals, especially breakfast.  Reduce your use of caffeine, tobacco, and alcohol. Caffeine is most often found in coffee, tea, cola drinks, and chocolate.  Limit medicines that can cause fatigue. This includes tranquilizers and cold and allergy medicines.  When should you call for help?  Watch closely for changes in your health, and be sure to contact your doctor if:   You have new symptoms such as fever or a rash.    Your fatigue gets worse.    You have been feeling down, depressed, or hopeless. Or you may have lost interest in things that you usually enjoy.    You are not getting better as expected.   Where can you learn more?  Go to https://www.bennett.info/ and enter W864 to  learn more about "Fatigue: Care Instructions."  Current as of: October 20, 2022Content Version: 13.7   2006-2023 Healthwise, Incorporated.   Care instructions adapted under license by [facility]. If you have questions about a medical condition or this instruction, always ask your healthcare professional. [place] any warranty or liability for your use of this information.           Learning About Emotional Support  When do you need emotional support?     You might find getting support from others helpful when you have a long-term health problem. Often people feel alone, confused, or scared when coping with an illness. But you aren't alone. Other people are going through the same thing you are and know how you feel.  Talking with others about your feelings can help you feel better.  Your family and friends can give you support. So can your doctor, a support group, or a church. If you have a support network, you will not feel as alone. You will learn new ways to deal with your situation, and you may try harder to overcome it.  Where you can get support  Family and friends: They can help you cope by giving you comfort and encouragement.  Counseling: Professional counseling can help you cope with situations that interfere with your life and cause stress. Counseling can help you understand and deal with your illness.  Your doctor: Find a doctor you trust and feel comfortable with. Be open and honest about your fears and concerns. Your doctor can help you get the right medical treatments, including counseling.  Spiritual or religious groups: They can provide comfort and may be able to help you find counseling or other social support services.  Social groups: They can help you meet new people and get involved in activities you enjoy.  Community support groups: In a support group, you can talk to others who have dealt with the same problems or illness as you. You can encourage one  another and learn ways to cope with tough emotions.  How can you find a support group?  Finding a support group that works for you may take time. There are many options. Some groups have a group leader who helps lead discussions or shares information. Others are less formal. Some meet in person, while others meet online.  Try using these resources to help you find the best support group for you.  Your doctor, health care team, or counselor.  People with the same health concern.  Your local church, mosque, synagogue, or other religious group.  A city, state, or national group that provides support for your health concern. Check your local [place] or community center for a list of these groups. Or look for information online.  Your local community, friends, and family.  Supportive relationships  A supportive relationship includes emotional support such as love, trust, and understanding, as well as advice and concrete help, such as help managing your time.  Reach out to others  Family and friends can help you. Ask them to:  Listen to you and give you encouragement. This can keep you from feeling hopeless or alone.  Help with small daily tasks or with bigger problems. A helping hand can keep you from feeling overwhelmed.  Help you manage a health problem. For example, ask them to go to doctor visits with you. Your loved ones can offer support by being involved in your medical care.  Respect your relationships  A good relationship is also a two-way street. You count on help from others, but they also count on you.  Know your friends' limits. You don't have to see or call your friends every day. If you are going through a rough patch, ask friends if you can contact them outside of the usual boundaries.  Don't always complain or talk about yourself. Know when it's time to stop talking and listen or just enjoy your friend's company.  Know that good friends can be a bad influence. For example, if a friend encourages you to  drink when you know it will harm you, you may want to end the friendship.  Where can you learn more?  Go to https://www.bennett.info/ and enter G092 to learn more about "Learning About Emotional Support."  Current  as of: October 20, 2022Content Version: 13.7   2006-2023 Healthwise, Incorporated.   Care instructions adapted under license by [facility]. If you have questions about a medical condition or this instruction, always ask your healthcare professional. Healthwise, Incorporated disclaims any warranty or liability for your use of this information.      For more information on your local Area Agency on Aging or Council on Aging please visit the appropriate web site below:    [place]: AssistantPositions.pl    [place]: [email or web address].https://www.fletcher.com/    [place]: PackageNews.de    [place]: ResidentialBuyers.fi           Starting a Weight Loss Plan: Care Instructions  Overview     If you're thinking about losing weight, it can be hard to know where to start. Your doctor can help you set up a weight loss plan that best meets your needs. You may want to take a class on nutrition or exercise, or you could join a weight loss support group. If you have questions about how to make changes to your eating or exercise habits, ask your doctor about seeing a registered dietitian or an exercise specialist.  It can be a big challenge to lose weight. But you don't have to make huge changes at once. Make small changes, and stick with them. When those changes become habit, add a few more changes.  If you don't think you're ready to make changes right now, try to pick a date in the future. Make an appointment to see your doctor to discuss whether the time is right for you to start a plan.  Follow-up care is a key part of your treatment and safety. Be sure to make and go to all appointments, and call your doctor if you are having problems. It's also a good idea to  know your test results and keep a list of the medicines you take.  How can you care for yourself at home?  Set realistic goals. Many people expect to lose much more weight than is likely. A weight loss of 5% to 10% of your body weight may be enough to improve your health.  Get family and friends involved to provide support. Talk to them about why you are trying to lose weight, and ask them to help. They can help by participating in exercise and having meals with you, even if they may be eating something different.  Find what works best for you. If you do not have time or do not like to cook, a program that offers meal replacement bars or shakes may be better for you. Or if you like to prepare meals, finding a plan that includes daily menus and recipes may be best.  Ask your doctor about other health professionals who can help you achieve your weight loss goals.  A dietitian can help you make healthy changes in your diet.  An exercise specialist or personal trainer can help you develop a safe and effective exercise program.  A counselor or psychiatrist can help you cope with issues such as depression, anxiety, or family problems that can make it hard to focus on weight loss.  Consider joining a support group for people who are trying to lose weight. Your doctor can suggest groups in your area.  Where can you learn more?  Go to RecruitSuit.ca and enter U357 to learn more about "Starting a Weight Loss Plan: Care Instructions."  Current as of: March 1, 2023Content Version: 13.7   2006-2023 Healthwise, Incorporated.  Care instructions adapted under license by [facility]. If you have questions about a medical condition or this instruction, always ask your healthcare professional. Healthwise, Incorporated disclaims any warranty or liability for your use of this information.           A Healthy Heart: Care Instructions  Your Care Instructions     Coronary artery disease, also called  heart disease, occurs when a substance called plaque builds up in the vessels that supply oxygen-rich blood to your heart muscle. This can narrow the blood vessels and reduce blood flow. A heart attack happens when blood flow is completely blocked. A high-fat diet, smoking, and other factors increase the risk of heart disease.  Your doctor has found that you have a chance of having heart disease. You can do lots of things to keep your heart healthy. It may not be easy, but you can change your diet, exercise more, and quit smoking. These steps really work to lower your chance of heart disease.  Follow-up care is a key part of your treatment and safety. Be sure to make and go to all appointments, and call your doctor if you are having problems. It's also a good idea to know your test results and keep a list of the medicines you take.  How can you care for yourself at home?  Diet   Use less salt when you cook and eat. This helps lower your blood pressure. Taste food before salting. Add only a little salt when you think you need it. With time, your taste buds will adjust to less salt.    Eat fewer snack items, fast foods, canned soups, and other high-salt, high-fat, processed foods.    Read food labels and try to avoid saturated and trans fats. They increase your risk of heart disease by raising cholesterol levels.    Limit the amount of solid fat-butter, margarine, and shortening-you eat. Use olive, peanut, or canola oil when you cook. Bake, broil, and steam foods instead of frying them.    Eat a variety of fruit and vegetables every day. Dark green, deep orange, red, or yellow fruits and vegetables are especially good for you. Examples include spinach, carrots, peaches, and berries.    Foods high in fiber can reduce your cholesterol and provide important vitamins and minerals. High-fiber foods include whole-grain cereals and breads, oatmeal, beans, brown rice, citrus fruits, and apples.    Eat lean proteins.  Heart-healthy proteins include seafood, lean meats and poultry, eggs, beans, peas, nuts, seeds, and soy products.    Limit drinks and foods with added sugar. These include candy, desserts, and soda pop.   Lifestyle changes   If your doctor recommends it, get more exercise. Walking is a good choice. Bit by bit, increase the amount you walk every day. Try for at least 30 minutes on most days of the week. You also may want to swim, bike, or do other activities.    Do not smoke. If you need help quitting, talk to your doctor about stop-smoking programs and medicines. These can increase your chances of quitting for good. Quitting smoking may be the most important step you can take to protect your heart. It is never too late to quit.    Limit alcohol to 2 drinks a day for men and 1 drink a day for women. Too much alcohol can cause health problems.    Manage other health problems such as diabetes, high blood pressure, and high cholesterol. If you think  you may have a problem with alcohol or drug use, talk to your doctor.   Medicines   Take your medicines exactly as prescribed. Call your doctor if you think you are having a problem with your medicine.    If your doctor recommends aspirin, take the amount directed each day. Make sure you take aspirin and not another kind of pain reliever, such as acetaminophen (Tylenol).   When should you call for help?   Call 911 if you have symptoms of a heart attack. These may include:   Chest pain or pressure, or a strange feeling in the chest.    Sweating.    Shortness of breath.    Pain, pressure, or a strange feeling in the back, neck, jaw, or upper belly or in one or both shoulders or arms.    Lightheadedness or sudden weakness.    A fast or irregular heartbeat.   After you call 911, the operator may tell you to chew 1 adult-strength or 2 to 4 low-dose aspirin. Wait for an ambulance. Do not try to drive yourself.  Watch closely for changes in your health, and be sure to  contact your doctor if you have any problems.  Where can you learn more?  Go to RecruitSuit.ca and enter F075 to learn more about "A Healthy Heart: Care Instructions."  Current as of: September 7, 2022Content Version: 13.7   2006-2023 Healthwise, Incorporated.   Care instructions adapted under license by [facility]. If you have questions about a medical condition or this instruction, always ask your healthcare professional. Healthwise, Incorporated disclaims any warranty or liability for your use of this information.      Personalized Preventive Plan for Patrick Welch - 11/17/2021  Medicare offers a range of preventive health benefits. Some of the tests and screenings are paid in full while other may be subject to a deductible, co-insurance, and/or copay.    Some of these benefits include a comprehensive review of your medical history including lifestyle, illnesses that may run in your family, and various assessments and screenings as appropriate.    After reviewing your medical record and screening and assessments performed today your provider may have ordered immunizations, labs, imaging, and/or referrals for you.  A list of these orders (if applicable) as well as your Preventive Care list are included within your After Visit Summary for your review.    Other Preventive Recommendations:    A preventive eye exam performed by an eye specialist is recommended every 1-2 years to screen for glaucoma; cataracts, macular degeneration, and other eye disorders.  A preventive dental visit is recommended every 6 months.  Try to get at least 150 minutes of exercise per week or 10,000 steps per day on a pedometer .  Order or download the FREE "Exercise & Physical Activity: Your Everyday Guide" from [employer] on Aging. Call (970) [phone] or search [employer] on Aging online.  You need 1200-1500 mg of calcium and 1000-2000 IU of vitamin D per day. It is possible to  meet your calcium requirement with diet alone, but a vitamin D supplement is usually necessary to meet this goal.  When exposed to the sun, use a sunscreen that protects against both UVA and UVB radiation with an SPF of 30 or greater. Reapply every 2 to 3 hours or after sweating, drying off with a towel, or swimming.  Always wear a seat belt when traveling in a car. Always wear a helmet when riding a bicycle or  motorcycle.

## 2021-11-17 NOTE — Progress Notes (Signed)
dentified pt with two pt identifiers(name and DOB).    Chief Complaint   Patient presents with    Medicare AWV     Patient presents for his AWV.        Health Maintenance Due   Topic    Depression Screen     GFR test (Diabetes, CKD 3-4, OR last GFR 15-59)     Hepatitis C screen     Lipids     Colorectal Cancer Screen     Shingles vaccine (1 of 2)    Annual Wellness Visit (AWV)     COVID-19 Vaccine (3 - Booster for [employer] series)    Flu vaccine (1)       Wt Readings from Last 3 Encounters:   11/17/21 221 lb 6.4 oz (100.4 kg)     Temp Readings from Last 3 Encounters:   11/17/21 97.6 F (36.4 C) (Temporal)     BP Readings from Last 3 Encounters:   11/17/21 133/74     Pulse Readings from Last 3 Encounters:   11/17/21 66           Coordination of Care Questionnaire:  :   1. "Have you been to the ER, urgent care clinic since your last visit?  Hospitalized since your last visit?" no    2. "Have you seen or consulted any other health care providers outside of the [facility] since your last visit?" no     3. For patients aged 35-75: Has the patient had a colonoscopy / FIT/ Cologuard? Believes he is up-to-date    3) Do you have an Advance Directive on file? Not on file but he does have AD    Are you interested in receiving information about Advance Directives? no    Patient is accompanied by self I have received verbal consent from [facility] to discuss any/all medical information while they are present in the room.

## 2021-11-18 LAB — MICROALBUMIN / CREATININE URINE RATIO
Creatinine, Ur: 40.7 mg/dL
Microalbumin Creatinine Ratio: <12 mg/g (ref 0–30)
Microalbumin, Random Urine: <0.5 MG/DL

## 2021-11-18 LAB — CBC WITH AUTO DIFFERENTIAL
Absolute Immature Granulocyte: 0 10*3/uL (ref 0.00–0.04)
Basophils %: 1 % (ref 0–1)
Basophils Absolute: 0 10*3/uL (ref 0.0–0.1)
Eosinophils %: 1 % (ref 0–7)
Eosinophils Absolute: 0.1 10*3/uL (ref 0.0–0.4)
Hematocrit: 45.1 % (ref 36.6–50.3)
Hemoglobin: 14 g/dL (ref 12.1–17.0)
Immature Granulocytes: 0 % (ref 0.0–0.5)
Lymphocytes %: 37 % (ref 12–49)
Lymphocytes Absolute: 2.2 10*3/uL (ref 0.8–3.5)
MCH: 29.1 PG (ref 26.0–34.0)
MCHC: 31 g/dL (ref 30.0–36.5)
MCV: 93.8 FL (ref 80.0–99.0)
MPV: 10.6 FL (ref 8.9–12.9)
Monocytes %: 10 % (ref 5–13)
Monocytes Absolute: 0.6 10*3/uL (ref 0.0–1.0)
Neutrophils %: 51 % (ref 32–75)
Neutrophils Absolute: 3.2 10*3/uL (ref 1.8–8.0)
Nucleated RBCs: 0 PER 100 WBC
Platelets: 183 10*3/uL (ref 150–400)
RBC: 4.81 M/uL (ref 4.10–5.70)
RDW: 18.9 % — ABNORMAL HIGH (ref 11.5–14.5)
WBC: 6.1 10*3/uL (ref 4.1–11.1)
nRBC: 0 10*3/uL (ref 0.00–0.01)

## 2021-11-18 LAB — COMPREHENSIVE METABOLIC PANEL
ALT: 35 U/L (ref 12–78)
AST: 22 U/L (ref 15–37)
Albumin/Globulin Ratio: 1.4 (ref 1.1–2.2)
Albumin: 3.9 g/dL (ref 3.5–5.0)
Alk Phosphatase: 111 U/L (ref 45–117)
Anion Gap: 6 mmol/L (ref 5–15)
BUN: 17 MG/DL (ref 6–20)
Bun/Cre Ratio: 15 (ref 12–20)
CO2: 27 mmol/L (ref 21–32)
Calcium: 9.1 MG/DL (ref 8.5–10.1)
Chloride: 106 mmol/L (ref 97–108)
Creatinine: 1.15 MG/DL (ref 0.70–1.30)
Est, Glom Filt Rate: >60 mL/min/{1.73_m2} (ref >60)
Globulin: 2.8 g/dL (ref 2.0–4.0)
Glucose: 96 mg/dL (ref 65–100)
Potassium: 4.6 mmol/L (ref 3.5–5.1)
Sodium: 139 mmol/L (ref 136–145)
Total Bilirubin: 0.4 MG/DL (ref 0.2–1.0)
Total Protein: 6.7 g/dL (ref 6.4–8.2)

## 2021-11-18 LAB — LIPID PANEL
Chol/HDL Ratio: 3.1 (ref 0.0–5.0)
Cholesterol, Total: 138 MG/DL (ref <200)
HDL: 44 MG/DL
LDL Calculated: 77.4 MG/DL (ref 0–100)
Triglycerides: 83 MG/DL (ref <150)
VLDL Cholesterol Calculated: 16.6 MG/DL

## 2021-11-18 LAB — IRON AND TIBC
Iron % Saturation: 25 % (ref 20–50)
Iron: 101 ug/dL (ref 35–150)
TIBC: 408 ug/dL (ref 250–450)

## 2021-11-18 LAB — VITAMIN B12 & FOLATE
Folate: 18.8 ng/mL (ref 5.0–21.0)
Vitamin B-12: 449 pg/mL (ref 193–986)

## 2021-11-18 LAB — FERRITIN: Ferritin: 25 NG/ML — ABNORMAL LOW (ref 26–388)

## 2021-11-18 LAB — TSH: TSH, 3RD GENERATION: 1.31 u[IU]/mL (ref 0.36–3.74)

## 2021-11-18 NOTE — Other (Signed)
Reviewed labs with abnormal findings.

## 2021-12-01 NOTE — Telephone Encounter (Signed)
From: Garald Braver  To: [name]  Sent: 11/30/2021 3:34 PM EDT  Subject: My wife Patrick Welch DOB 02/06/1945    Might it be possible to get a quick appointment with you for my wife? 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 She was previously a patient at [facility], [facility] [phone]). Her Dr (Dr Su Grand) has left the area and we were advised to seek a new provider.   There are no refills remaining on the RX. The pharmacist said he will try to get someone at the old practice to write a new RX, but he didn't sound optimistic.   Her chart should be in your system.   I want to bring her in but she is so defiant about leaving the house (says she will mess her pants because of the condition). She needs to be seen in order to get the new RX, as well as other RX's that will eventually expire.   Her battling depression is also holding her back. How can I work something out when she is reluctant to help herself? I am hoping for suggestions.   [name]'s DOB 02/06/1945    I could see this problem coming, but she just can't make herself participate.     [employer]  [phone]

## 2021-12-15 ENCOUNTER — Ambulatory Visit: Admit: 2021-12-15 | Discharge: 2021-12-22 | Payer: MEDICARE | Primary: Family Medicine

## 2021-12-15 DIAGNOSIS — E1165 Type 2 diabetes mellitus with hyperglycemia: Secondary | ICD-10-CM

## 2021-12-15 NOTE — Progress Notes (Signed)
Systems analyst for Diabetes Health  Diabetes Self-Management Education & Support Program    Reason for Referral: Controlled T2 w/ Hyperglycemia  Referral Source: [name], APRN *  Services requested: DSMES       ASSESSMENT    From my perspective, the participant would benefit from [facility] specifically related to reducing risks, healthy eating, monitoring, taking medications, physical activity, healthy coping, and problem solving. Will adapt DSMES program to build on participant's skills score, confidence score, and preparedness score as noted in the Diabetes Skills, Confidence, and Preparedness Index.    During the program, we will focus on providing DSMES that specifically addresses participant's interest in reducing risks, healthy eating, monitoring, taking medications, physical activity, healthy coping, and problem solving, as shown by their reported readiness to change.    The participant would be best served by attending weekly group class series.    Diabetes Self-Management Education Follow-up Visit: 01/07/22       Clinical Presentation  Patrick Welch is a 76 y.o. White male referred for diabetes self-management education. Participant has Type 2 DM not on insulin for <1 year. Family history negative for diabetes. Patient reports not receiving DSMES services in the past. Most recent A1c value:   Lab Results   Component Value Date/Time    HBA1CPOC 5.8 11/17/2021 11:00 AM       Diabetes-related medical history:  Microvascular disease  nephropathy    Diabetes-related medications:  Current dosing: metFORMIN - 500 MG    Blood Pressure Management  valsartan - 80 MG      Lipid Management  This patient does not have an active medication from one of the medication groupers.      Clot Prevention  This patient does not have an active medication from one of the medication groupers.    Learning Assessment  Learning objectives Educator assessment (12/15/2021)   Diabetes Disease Process  The participant can   A)  describe diabetes in basic terms;   B) state the type of diabetes they have; &   C) state accepted blood glucose targets.     Healthy Eating  The participant can   A) identify carbohydrate foods; &   B) accurately read food labels.     Being Active  The participant can  A) state the benefits of physical activity;  B) report their current PA practices;  C) identify PA they would consider incorporating in their lives; &  D) develop an implementation plan.     Monitoring  The participant can  A) operate their blood glucose meter; &  B) describe how they log their blood glucoses to share with their provider.     Taking Medications  The participant can  A) name their diabetes medications;  B) state the purpose and dose;  C) note side effects; &  D) describe proper storage, disposal & transport (if appropriate).     Healthy Coping  The participant can    A) describe their response to diabetes diagnosis;  B) describe their specific coping mechanisms;  C) identify supportive people and/or other resources that positively support their diabetes self-care and health.    Reducing Risks  The participant can describe the preventive measures used by providers to promote health and prevent diabetes complications.     Problem Solving  The participant can   A) identify signs, symptoms & treatment of hypoglycemia;    B) identify signs, symptoms & treatment of hyperglycemia;  C) describe their sick day plan; &  D) identify BG patterns to discuss with their provider.       Yes  Yes  Yes        Yes  No        Yes  No  Yes  Yes        Yes  No        Yes  Yes  No  N/a        Yes  Yes  Yes        Yes          No  No  No  N/a     Characteristics to Learning   Barriers to Learning      None     Favorite Ways to Learn   [x]  Lecture  [x]  Slides  [x]  Reading [x]  Video-Internet  []  Cassettes/CDs/MP3's  [x]  Interactive Small Groups []  Other       Behavioral Assessment  Current self-care practices  Educator assessment (12/15/2021)   Healthy Eating    Current practices    24-hour Dietary Recall:  Breakfast: oatmeal w/ water, almond milk, blueberries, 1 egg, toast  Lunch: salad and protein shake ([employer] brand 30g protein)  Dinner: roasted veggies, chicken thighs  Snacks: popcorn or ice cream after dinner  Beverages: hot tea w/ stevia, water  Alcohol: 2 glasses of wine after dinner every day       Would benefit from [facility] related to Healthy Eating: Yes    Eats a carbohydrate controlled diet: No     Stage of change: Contemplation      Being Active  Current practices  How many days during the past week have you performed physical activity where your heart beats faster and your breathing is harder than normal for 30 minutes or more?  0 day(s)    How many days in a typical week do you perform activity such as this?  4 day(s)     Would benefit from [facility] related to Being Active: Yes      Exercises 150 minutes/week: Yes      Stage of change: Action    Typically walks for 45 minutes after dinner except for the past couple of weeks due to hot temperatures   Monitoring  Current practices  Do you monitor your blood sugar? No    How often do you monitor? never    Do you know your last A1c measurement? No    Do you know the meaning of the A1c? Yes     Would benefit from [facility] related to Monitoring: Yes      Uses BG readings to establish trends and understand BG patterns: No      Stage of change: Contemplation   Taking Medication  Current practices  Do you understand what your diabetes medications do? No    How often do you miss doses of your diabetes medications? never    Can you afford your diabetes medications? Yes   Would benefit from [facility] related to Taking Medication: Yes      Takes medications consistently to receive full benefit: Yes      Stage of change: Maintenance        Healthy Coping   Current state  Diabetes Skills, Confidence and Preparedness Index:  Total score: 4.8  Skills: 4.0  Confidence: 5.3  Preparedness: 5.5       Would benefit from DSMES related to  Healthy Coping: Yes      Identifies specific people, organizations,etc, that actively support  their diabetes self-care efforts: Yes      Stage of change: Preparation     Reducing Risks  Current state  Vaccines:  Influenza:  11/2021      Pneumococcal: last administered date: 12/2008      Hepatitis: due     Examinations:  Eye exam: last appointment was: within the last year       Dental exam: last appointment was: 07/2021    Foot exam:  12/16/21      Heart Protection:  BP Readings from Last 2 Encounters:   11/17/21 133/74        Lab Results   Component Value Date/Time    LDLCALC 77.4 11/17/2021 10:49 AM        Kidney Protection:  No results found for: "MCA2", "MCAU", "MCAU2"     Would benefit from [facility] related to Reducing Risks: Yes      Actively participates in decision-making with provider regarding secondary prevention:  Yes      Stage of change: Action   Problem Solving  Current state  Hypoglycemia Management:  What are signs and symptoms of hypoglycemia that you experience: Pt reported being unaware of s/s of hypoglycemia    How do you prevent hypoglycemia: patient is unaware of how to treat low blood sugars    How do you treat hypoglycemia: Rule of 15    Hyperglycemia Management:  What are signs and symptoms of hyperglycemia that you experience: Pt reported being unaware of s/s of hyperglycemia    How can you prevent hyperglycemia: take medications as instructed    Sick Day Management:  What do you do differently on sick days:  Pt reported being unaware of self-management on sick days    Pattern Management:  Do you notice blood glucose patterns when you look at the readings in your meter or logbook? No    How do you use the blood glucose readings from your meter or logbook?  Not monitoring     Would benefit from [facility] related to Problem Solving: Yes      Articulates appropriate strategies to address hypoglycemia, hyperglycemia, sick day care and BG pattern: No      Stage of change: Preparation      Note: Content  derived from the American Association of Diabetes Educators' Diabetes Education Curriculum: A Guide to Successful Self-Management (3rd edition)      Royden Purl, RD on 12/15/2021 at 9:26 AM    I have personally reviewed the health record, including provider notes, laboratory data and current medications before making these care and education recommendations. The time spent in this effort is included in the total time.  Total minutes: 30    [facility] Logo  Diabetes Skills, Confidence & Preparedness Index (SCPI)   Thank you for completing the Skills, Confidence & Preparedness Index!  Below are your scores.  All scales and questions are out of 7.  If you would like these results emailed, please enter your email address along with some identifying patient information.  Email:    Patient Identifier:        Overall SCPI score: 4.8 Skills Score: 4.0  Low: Blood Sugar Monitoring(Q4),Reducing Risks(Q5),Problem Solving(Q6),Healthy Coping(Q7) Confidence Score: 5.3  Low: Healthy Coping(Q2) Preparedness Score: 5.5  Low: Healthy Coping(Q3),Blood Sugar Monitoring(Q6)  Healthy Eating Score: 6.8  Low: Confidence(Q4) Taking Medication Score: 5.0  Low: Skills(Q2) Blood Sugar Monitoring Score: 4.0  Low: DSKAJG(O1) Reducing Risks Score: 4.8  Low: Skills(Q5)  Problem Solving Score: 4.7  Low: Skills(Q6) Healthy  Coping Score: 3.0  Low: IRCVEL(F8) Being Active Score: 6.0  Low: Confidence(Q5),Preparedness(Q2)    Skills/Knowledge Questions  1. I know how to plan meals that have the best balance between carbohydrates, proteins and vegetables. 7  2. I know how my diabetes medications (pills, injectables and/or insulin) work in my body. 5  3. I know when to check my blood sugar if I want to see how my body responded to a meal. 6  4. I know when to check my blood sugars to determine if my medication or insulin doses are correct. 2  5. I know what to do to prevent a low blood sugar when I exercise (either before, during, or after). 2  6. When I am  sick, I know what to do differently with my diabetes management. 2  7. I know how stress can affect my diabetes management. 2  8. When I look at my blood sugars over a given week, I can explain what my blood sugar pattern is. 4  9. I know what my target levels are for A1c, blood pressure and cholesterol. 6  Confidence Questions  1. I am confident that I can plan balanced meals and snacks. 7  2. I am confident that I can manage my stress. 3  3. I am confident that I can prevent a low blood sugar during or after exercise. 5  4. I am confident that the next time I eat out, I will be able to choose foods that best keep my blood sugars in target. 6  5. I am confident I can include exercise into my schedule. 6  6. I am confident that I can use my daily blood sugars to adjust my diet, my activity, and/or my insulin. 4  7. When something out of my normal routine happens, I am confident that I can problem-solve and keep my diabetes on track. 6  Preparedness Questions  1. Within the next month, I will begin to eat more balanced meals and snacks. 8  2. Within the next month, I will choose an exercise activity and I will start fitting it into my schedule. 6  3. Within the next month, I will make a list of stress management options that work for me. 4  4. Within the next month, I will consistently plan ahead to prevent low blood sugars. 6  5. Within the next month, I will start adjusting my insulin doses on my own. 0  6. Within the next month, I will begin making changes to my diabetes management based on my daily blood sugars (eg - eating, activity and/or insulin). 4  7. Within the next month, I will begin making changes to my diabetes management to meet my overall goals (eg - eating, activity and/or insulin). 6

## 2021-12-23 ENCOUNTER — Telehealth: Payer: Self-pay | Admitting: Orthopaedic Surgery

## 2021-12-23 NOTE — Telephone Encounter (Signed)
emailed

## 2021-12-23 NOTE — Telephone Encounter (Signed)
Please advise 

## 2021-12-23 NOTE — Telephone Encounter (Signed)
Letter completed. Do you mind emailing it for me?

## 2021-12-23 NOTE — Telephone Encounter (Signed)
Received call from pt. He states he had knee surgery a few years ago and wants to know if he has titanium in his hardware. Please call [phone].

## 2021-12-23 NOTE — Telephone Encounter (Signed)
I called the pt and advised. He stated understanding. He wanted to know if he can get a MRI. I told him it should be fine but her wanted me to check with you then email this to him @ [email or web address]. please advise

## 2021-12-23 NOTE — Telephone Encounter (Signed)
Formatting of this note might be different from the original.  Pensions consultant signed by Raleigh Lions at 12/23/2021  4:03 PM EDT

## 2021-12-23 NOTE — Telephone Encounter (Signed)
Formatting of this note might be different from the original.  I called the pt and advised. He stated understanding. He wanted to know if he can get a MRI. I told him it should be fine but her wanted me to check with you then email this to him @ [email or web address]. please advise  Electronically signed by Milus Height, Autumn L, RT at 12/23/2021 11:18 AM EDT

## 2021-12-23 NOTE — Telephone Encounter (Signed)
Formatting of this note might be different from the original.  Please advise  Electronically signed by Milus Height, Autumn L, RT at 12/23/2021  9:31 AM EDT

## 2021-12-23 NOTE — Telephone Encounter (Signed)
Formatting of this note might be different from the original.  Received call from pt. He states he had knee surgery a few years ago and wants to know if he has titanium in his hardware. Please call [phone].  Electronically signed by Raleigh Lions at 12/23/2021  9:30 AM EDT

## 2021-12-23 NOTE — Telephone Encounter (Signed)
Formatting of this note might be different from the original.  Letter completed. Do you mind emailing it for me?  Electronically signed by Elvin So L, RT at 12/23/2021  3:19 PM EDT

## 2022-01-05 MED ORDER — TAMSULOSIN HCL 0.4 MG PO CAPS
0.4 MG | ORAL_CAPSULE | Freq: Every day | ORAL | 1 refills | Status: DC
Start: 2022-01-05 — End: 2022-06-27

## 2022-01-05 NOTE — Telephone Encounter (Signed)
PCP: Elana Alm, APRN - CNP    Last appt: @[facility] @  Patient was last seen on 11/17/2021  Future Appointments   Date Time Provider [place]   05/20/2022  9:20 AM Saxby, Sharlette Dense, APRN - CNP [facility] BS AMB       Requested Prescriptions     Pending Prescriptions Disp Refills    tamsulosin (FLOMAX) 0.4 MG capsule 90 capsule      Sig: Take 1 capsule by mouth daily       Prior labs and Blood pressures:  BP Readings from Last 3 Encounters:   11/17/21 133/74     Lab Results   Component Value Date/Time    NA 139 11/17/2021 10:49 AM    K 4.6 11/17/2021 10:49 AM    CL 106 11/17/2021 10:49 AM    CO2 27 11/17/2021 10:49 AM    BUN 17 11/17/2021 10:49 AM     Lab Results   Component Value Date/Time    HBA1CPOC 5.8 11/17/2021 11:00 AM     Lab Results   Component Value Date/Time    CHOL 138 11/17/2021 10:49 AM    HDL 44 11/17/2021 10:49 AM     No results found for: "VITD3", "VD3RIA"    No results found for: "TSH", "TSH2", "TSH3"

## 2022-01-07 ENCOUNTER — Ambulatory Visit: Payer: MEDICARE | Primary: Family Medicine

## 2022-01-14 ENCOUNTER — Encounter: Payer: MEDICARE | Primary: Family Medicine

## 2022-01-19 MED ORDER — MIRTAZAPINE 30 MG PO TABS
30 MG | ORAL_TABLET | Freq: Every evening | ORAL | 3 refills | Status: AC
Start: 2022-01-19 — End: 2022-05-06

## 2022-01-19 NOTE — Telephone Encounter (Signed)
PCP: Elana Alm, APRN - CNP    Last appt: @[facility] @  Future Appointments   Date Time Provider [place]   05/20/2022  9:20 AM Saxby, Sharlette Dense, APRN - CNP [facility] BS AMB       Requested Prescriptions     Pending Prescriptions Disp Refills    mirtazapine (REMERON) 30 MG tablet 30 tablet        Prior labs and Blood pressures:  BP Readings from Last 3 Encounters:   11/17/21 133/74     Lab Results   Component Value Date/Time    NA 139 11/17/2021 10:49 AM    K 4.6 11/17/2021 10:49 AM    CL 106 11/17/2021 10:49 AM    CO2 27 11/17/2021 10:49 AM    BUN 17 11/17/2021 10:49 AM     Lab Results   Component Value Date/Time    HBA1CPOC 5.8 11/17/2021 11:00 AM     Lab Results   Component Value Date/Time    CHOL 138 11/17/2021 10:49 AM    HDL 44 11/17/2021 10:49 AM     No results found for: "VITD3", "VD3RIA"    No results found for: "TSH", "TSH2", "TSH3"

## 2022-01-21 ENCOUNTER — Encounter: Payer: MEDICARE | Primary: Family Medicine

## 2022-01-21 NOTE — Telephone Encounter (Signed)
Summary: Appointtment    Formatting of this note might be different from the original.  Not able to reach patient for appointment/labs. Phone number listed in chart has been changed via automatic notification from [place]. Patient needs labs for appointment.  Electronically signed by Salena Saner, MA at 01/21/2022  8:02 AM MDT

## 2022-01-23 ENCOUNTER — Encounter

## 2022-01-24 MED ORDER — METFORMIN HCL 500 MG PO TABS
500 MG | ORAL_TABLET | Freq: Every day | ORAL | 1 refills | Status: AC
Start: 2022-01-24 — End: 2022-08-01

## 2022-01-24 NOTE — Telephone Encounter (Signed)
PCP: Elana Alm, APRN - CNP    Last appt: @[facility]IS[facility] @  Future Appointments   Date Time Provider [place]   05/20/2022  9:20 AM Saxby, Sharlette Dense, APRN - CNP [facility] BS AMB       Requested Prescriptions     Pending Prescriptions Disp Refills    metFORMIN (GLUCOPHAGE) 500 MG tablet 90 tablet 1       Prior labs and Blood pressures:  BP Readings from Last 3 Encounters:   11/17/21 133/74     Lab Results   Component Value Date/Time    NA 139 11/17/2021 10:49 AM    K 4.6 11/17/2021 10:49 AM    CL 106 11/17/2021 10:49 AM    CO2 27 11/17/2021 10:49 AM    BUN 17 11/17/2021 10:49 AM     Lab Results   Component Value Date/Time    HBA1CPOC 5.8 11/17/2021 11:00 AM     Lab Results   Component Value Date/Time    CHOL 138 11/17/2021 10:49 AM    HDL 44 11/17/2021 10:49 AM     No results found for: "VITD3", "VD3RIA"    No results found for: "TSH", "TSH2", "TSH3"

## 2022-01-24 NOTE — Telephone Encounter (Signed)
From: Tim Lair  To: Elana Alm  Sent: 01/23/2022 6:43 PM EDT  Subject: New RX - Metformin 500mg     Hello Terrace Chiem     I am in need of a new RX for Metformin 500mg  sent to [facility] (90 day supply). I somehow missed it in my prior request. I will try to be more attentive to my prescription needs in the future.     Thanks, always     [place]  DOB [birth year]  [phone]

## 2022-01-28 ENCOUNTER — Encounter: Payer: MEDICARE | Primary: Family Medicine

## 2022-02-14 NOTE — Assessment & Plan Note (Signed)
Associated Problem(s): Benign prostatic hyperplasia  Formatting of this note might be different from the original.  On Flomax 0.4 mg daily  Electronically signed by De Burrs, MD at 02/14/2022  6:56 AM MST

## 2022-02-14 NOTE — Assessment & Plan Note (Signed)
Associated Problem(s): Essential hypertension  Formatting of this note might be different from the original.  Controlled  Continue on Diovan    Electronically signed by De Burrs, MD at 02/14/2022  6:55 AM MST

## 2022-02-14 NOTE — Progress Notes (Signed)
Formatting of this note is different from the original.  Primary Care Physician: Theodore Demark, NP  Referring Physician: No care team member to display  Date of consult:  02/14/22    History of Present Illness:  Patrick Welch is a 76 y.o. male presented to the office for follow-up of CKD.  Creatinine is 1.2 and GFR 63 mill per minute.  UPC is 340 mg/g.  Blood pressure is well controlled.  Patient is complaining of nocturia at least 2-3 times per night.  Diabetes is well controlled.  He is on metformin.  He has mild lower extremity edema.  Currently he use compression stocking.  ROS:  No CP, SOB, trace Edema, Weakness, fatigue.     Physical Exam:  BP 122/76 (BP Location: Right arm, Patient Position: Sitting, BP Cuff Size: Adult)   Pulse 75   Temp 97.5 F (36.4 C) (Temporal)   SpO2 96%   BMI 31.52 kg/m     General: sitting in chair with no complaints  HEENT: AT NC PEERLA  CVS: S1 S2 RRR, No murmur  RS: CTABL, no wheezing or crackles  ABDOMEN: Soft NT, ND + BS  EXT: trace edema   Neurology: AAAX3    Medications:    Current Outpatient Medications:     acetaminophen (TYLENOL 8 HOUR) 650 MG 8 hr tablet, Take 650 mg by mouth every 8 (eight) hours if needed for mild pain 2 tablets BID, Disp: , Rfl:     Cyanocobalamin (VITAMIN B 12 PO), Take 1,000 tablets by mouth in the morning., Disp: , Rfl:     DULoxetine (CYMBALTA) 30 MG DR capsule, Take 30 mg by mouth 1 (one) time each day, Disp: , Rfl:     famotidine (PEPCID) 20 MG tablet, Take 20 mg by mouth in the morning and 20 mg before bedtime., Disp: , Rfl:     ferrous sulfate 325 (65 Fe) MG EC tablet, Take 325 mg by mouth 1 (one) time each day with breakfast, Disp: , Rfl:     Melatonin 5 MG capsule, Take 5 capsules by mouth at bed time, Disp: , Rfl:     metFORMIN (GLUCOPHAGE) 500 MG tablet, Take 500 mg by mouth 1 (one) time each day with breakfast, Disp: , Rfl:     methocarbamol (ROBAXIN) 750 MG tablet, Take 750 mg by mouth 1 (one) time each day, Disp: , Rfl:      mirtazapine (REMERON) 30 MG tablet, Take 30 mg by mouth every night, Disp: , Rfl:     tamsulosin (FLOMAX) 0.4 MG 24 hr capsule, Take 0.4 capsules by mouth in the morning., Disp: , Rfl:     valsartan (DIOVAN) 80 MG tablet, Take 1 tablet (80 mg total) by mouth 1 (one) time each day, Disp: 30 tablet, Rfl: 11    Labs and Imaging:  Labs reviewed with patient in clinic:  Results from last 6 months   Lab Units 01/24/22  1415   SODIUM, SERUM/PLASMA mmol/L 139   POTASSIUM mmol/L 4.4   CHLORIDE mmol/L 100   CARBON DIOXIDE (CO2) mmol/L 25   BLOOD UREA NITROGEN (BUN) mg/dL 20   CREATININE mg/dL 9.62   GLUCOSE mg/dL 952*   CALCIUM mg/dL 9.4   ALBUMIN g/dL 4.3   PHOSPHATE mg/dL 4.1     Results from last 6 months   Lab Units 01/24/22  1415   PROTEIN/CREATININE, URINE mg/g creat 340*   ALBUMIN/CREATININE, URINE mg/g creat 26     Results from last 6 months  Lab Units 01/24/22  1415   PTH INTACT pg/mL 19     Results from last 6 months   Lab Units 01/24/22  1415   HEMOGLOBIN (HGB) g/dL 14.8     Results from last 6 months   Lab Units 01/24/22  1415   PROTEIN, URINE mg/dL 8.8     Assessment/Plan   Chronic kidney disease stage 2  Multifactorial: Due to hypertension, smoking and component of BPH  Serum creatinine is 1.2 and GFR 63 mL/min  Urine protein 347 mg/gm  Continue Diovan 80 mg daily  Labs in 6 months  Follow-up with NP in 8 months    Essential hypertension  Controlled  Continue on Diovan    Proteinuria  UPC 340 mg /gm  Continue ARB    Benign prostatic hyperplasia  On Flomax 0.4 mg daily      MIPS Measures requiring follow-up  Blood Pressure for this visit is 122/76.  Blood pressure is well controlled.  Educate about low-sodium DASH diet.  The patient should return for BP check in next visit.  Body mass index is 31.52 kg/m. Follow up plan to address BMI is to maintain healthy diet exercise and weight loss.  Follow up plan to address tobacco use > patient already quit smoking    Recommendations:    There are no Patient  Instructions on file for this visit.  Orders Placed This Encounter   Procedures    Renal Function Panel     Standing Status:   Future     Number of Occurrences:   1     Standing Expiration Date:   02/15/2023    Total Protein w/ Creatinine, Urine, Random     Standing Status:   Future     Number of Occurrences:   1     Standing Expiration Date:   02/15/2023    Vitamin D, 25-Hydroxy     Standing Status:   Future     Number of Occurrences:   1     Standing Expiration Date:   02/15/2023     Future Appointments  No future appointments.    Electronically signed by De Burrs, MD at 02/14/2022  6:58 AM MST

## 2022-02-14 NOTE — Assessment & Plan Note (Signed)
Associated Problem(s): Proteinuria  Formatting of this note might be different from the original.  UPC 340 mg /gm  Continue ARB  Electronically signed by De Burrs, MD at 02/14/2022  6:56 AM MST

## 2022-02-14 NOTE — Assessment & Plan Note (Signed)
Associated Problem(s): Chronic kidney disease stage 2  Formatting of this note might be different from the original.  Multifactorial: Due to hypertension, smoking and component of BPH  Serum creatinine is 1.2 and GFR 63 mL/min  Urine protein 347 mg/gm  Continue Diovan 80 mg daily  Labs in 6 months  Follow-up with NP in 8 months  Electronically signed by De Burrs, MD at 02/14/2022  6:55 AM MST

## 2022-02-22 ENCOUNTER — Encounter

## 2022-02-22 NOTE — Telephone Encounter (Signed)
Patient requesting evaluation testing referred by provider SAXBY, HEATHER L for memory impairment.    Please contact

## 2022-03-04 NOTE — Telephone Encounter (Signed)
Patient calling back for status regarding appointment.

## 2022-03-14 MED ORDER — VALSARTAN 80 MG PO TABS
80 MG | ORAL_TABLET | Freq: Every day | ORAL | 1 refills | Status: DC
Start: 2022-03-14 — End: 2022-07-19

## 2022-03-14 NOTE — Telephone Encounter (Signed)
PCP: Elana Alm, APRN - CNP    Last appt: @LASTENCTHISDEPEXT @  Future Appointments   Date Time Provider [place]   05/20/2022  9:20 AM Saxby, Sharlette Dense, APRN - CNP [facility] AMB       Requested Prescriptions     Pending Prescriptions Disp Refills    valsartan (DIOVAN) 80 MG tablet 30 tablet 11     Sig: Take 1 tablet by mouth daily       Prior labs and Blood pressures:  BP Readings from Last 3 Encounters:   11/17/21 133/74     Lab Results   Component Value Date/Time    NA 139 11/17/2021 10:49 AM    K 4.6 11/17/2021 10:49 AM    CL 106 11/17/2021 10:49 AM    CO2 27 11/17/2021 10:49 AM    BUN 17 11/17/2021 10:49 AM     Lab Results   Component Value Date/Time    HBA1CPOC 5.8 11/17/2021 11:00 AM     Lab Results   Component Value Date/Time    CHOL 138 11/17/2021 10:49 AM    HDL 44 11/17/2021 10:49 AM     No results found for: "VITD3", "VD3RIA"    No results found for: "TSH", "TSH2", "TSH3"

## 2022-03-17 ENCOUNTER — Encounter

## 2022-03-29 NOTE — Telephone Encounter (Signed)
Returned call and scheduled all appts with Dr Daranciang.

## 2022-04-18 NOTE — Telephone Encounter (Signed)
Done

## 2022-04-18 NOTE — Telephone Encounter (Signed)
From: Tim Lair  To: Elana Alm  Sent: 04/16/2022 11:13 AM EST  Subject: Illene Bolus BP    Marilynn Latino needs a refill on her BP medication. She is currently on Metoprolol 50mg  and Amlodipine 5mg . I take her BP daily, and although it tracks at a high-normal to high I keep wondering if she is on the medication. She has been on these for 10+ years.     I was trying to do a refill on the Metoprolol which the label claims I can refill after 03/19/22, but [facility] says it can't be refilled until 06/18/22 which I suppose means a refill was sent but she never received.     Perhaps the easiest solution could be a new (different) RX. Is there an alternative to try that might help fluctuating high-normal/high BP?    Any suggestions? She has only 4 Metoprolol tablets remaining. Our current local drug store is [facility] [address].     Thanks    [employer]

## 2022-04-25 ENCOUNTER — Encounter

## 2022-04-25 NOTE — Telephone Encounter (Signed)
Called and left a message for patient to call back if they would like to r/s.

## 2022-04-25 NOTE — Telephone Encounter (Signed)
Patient has called in to cancel all his appointments and testing with Dr. Elby Beck.    PSR apologizes as he cancelled the appointments out of habit before remembering to message the neuropsych team first.

## 2022-04-27 ENCOUNTER — Encounter: Payer: MEDICARE | Attending: Student in an Organized Health Care Education/Training Program | Primary: Family Medicine

## 2022-05-04 ENCOUNTER — Encounter: Payer: MEDICARE | Primary: Family Medicine

## 2022-05-06 MED ORDER — MIRTAZAPINE 30 MG PO TABS
30 MG | ORAL_TABLET | Freq: Every evening | ORAL | 1 refills | Status: DC
Start: 2022-05-06 — End: 2022-10-24

## 2022-05-06 NOTE — Telephone Encounter (Signed)
PCP: Hale Bogus, APRN - CNP    Last appt: @LASTENCTHISDEPEXT @  Future Appointments   Date Time Provider [place]   05/20/2022  9:40 AM Saxby, Nira Conn, APRN - CNP [facility] AMB       Requested Prescriptions     Pending Prescriptions Disp Refills    mirtazapine (REMERON) 30 MG tablet 30 tablet 3     Sig: Take 1 tablet by mouth nightly       Prior labs and Blood pressures:  BP Readings from Last 3 Encounters:   11/17/21 133/74     Lab Results   Component Value Date/Time    NA 139 11/17/2021 10:49 AM    K 4.6 11/17/2021 10:49 AM    CL 106 11/17/2021 10:49 AM    CO2 27 11/17/2021 10:49 AM    BUN 17 11/17/2021 10:49 AM     Lab Results   Component Value Date/Time    HBA1CPOC 5.8 11/17/2021 11:00 AM     Lab Results   Component Value Date/Time    CHOL 138 11/17/2021 10:49 AM    HDL 44 11/17/2021 10:49 AM     No results found for: "VITD3", "VD3RIA"    No results found for: "TSH", "TSH2", "TSH3"

## 2022-05-18 ENCOUNTER — Encounter: Payer: MEDICARE | Attending: Student in an Organized Health Care Education/Training Program | Primary: Family Medicine

## 2022-05-20 ENCOUNTER — Ambulatory Visit: Payer: MEDICARE | Attending: Family Medicine | Primary: Family Medicine

## 2022-05-20 ENCOUNTER — Encounter

## 2022-05-20 ENCOUNTER — Ambulatory Visit: Admit: 2022-05-20 | Discharge: 2022-05-20 | Payer: MEDICARE | Attending: Family Medicine | Primary: Family Medicine

## 2022-05-20 DIAGNOSIS — Z0000 Encounter for general adult medical examination without abnormal findings: Secondary | ICD-10-CM

## 2022-05-20 DIAGNOSIS — E1165 Type 2 diabetes mellitus with hyperglycemia: Secondary | ICD-10-CM

## 2022-05-20 LAB — MICROALBUMIN / CREATININE URINE RATIO
Creatinine, Ur: 71.5 mg/dL
Microalbumin Creatinine Ratio: <7 mg/g (ref 0–30)
Microalbumin, Random Urine: <0.5 MG/DL

## 2022-05-20 LAB — COMPREHENSIVE METABOLIC PANEL
ALT: 34 U/L (ref 12–78)
AST: 21 U/L (ref 15–37)
Albumin/Globulin Ratio: 1.4 (ref 1.1–2.2)
Albumin: 3.7 g/dL (ref 3.5–5.0)
Alk Phosphatase: 80 U/L (ref 45–117)
Anion Gap: 5 mmol/L (ref 5–15)
BUN: 19 MG/DL (ref 6–20)
Bun/Cre Ratio: 14 (ref 12–20)
CO2: 27 mmol/L (ref 21–32)
Calcium: 9.6 MG/DL (ref 8.5–10.1)
Chloride: 107 mmol/L (ref 97–108)
Creatinine: 1.35 MG/DL — ABNORMAL HIGH (ref 0.70–1.30)
Est, Glom Filt Rate: 55 mL/min/{1.73_m2} — ABNORMAL LOW (ref >60)
Globulin: 2.7 g/dL (ref 2.0–4.0)
Glucose: 111 mg/dL — ABNORMAL HIGH (ref 65–100)
Potassium: 4.8 mmol/L (ref 3.5–5.1)
Sodium: 139 mmol/L (ref 136–145)
Total Bilirubin: 0.8 MG/DL (ref 0.2–1.0)
Total Protein: 6.4 g/dL (ref 6.4–8.2)

## 2022-05-20 LAB — HEMOGLOBIN A1C
Estimated Avg Glucose: 123 mg/dL
Hemoglobin A1C: 5.9 % — ABNORMAL HIGH (ref 4.0–5.6)

## 2022-05-20 MED ORDER — PNEUMOCOCCAL 20-VAL CONJ VACC 0.5 ML IM SUSY
0.5 | Freq: Once | INTRAMUSCULAR | 0 refills | Status: AC
Start: 2022-05-20 — End: 2022-05-20

## 2022-05-20 NOTE — Progress Notes (Unsigned)
Patrick Welch is followed for HTN and DMT2, as well as CKD. Last year he was referred to Nephrology Dr. Rae Halsted and has been seeing this provider. CKD thought multifactorial d/t HTN< hx smoking and BPH.

## 2022-05-20 NOTE — Assessment & Plan Note (Signed)
Well-controlled, continue current medications, medication adherence emphasized, and lifestyle modifications recommended

## 2022-05-20 NOTE — Assessment & Plan Note (Signed)
At goal, continue current medications, medication adherence emphasized, and lifestyle modifications recommended

## 2022-05-20 NOTE — Assessment & Plan Note (Signed)
Uncontrolled, lifestyle modifications recommended and provided contact info for counseling options. Awaiting neuropsych eval for memory loss/mood.

## 2022-05-20 NOTE — Other (Signed)
That was fast!  The kidney numbers went up slightly from the last time, but this is a very slight change.  I think you are doing very well overall. Many thanks, Hale Bogus FNP-BC

## 2022-05-20 NOTE — Progress Notes (Deleted)
Medicare Annual Wellness Visit    Patrick Welch is here for Medicare AWV and Discuss Medications    Assessment & Plan   Controlled type 2 diabetes mellitus with hyperglycemia, without long-term current use of insulin ([place])  Need for prophylactic vaccination and inoculation against varicella  The following orders have not been finalized:  -     zoster recombinant adjuvanted vaccine [facility]) 50 MCG/0.5ML SUSR injection  Need for prophylactic vaccination against Streptococcus pneumoniae (pneumococcus)  The following orders have not been finalized:  -     pneumococcal 20-valent conjugat (PREVNAR) 0.5 ML SUSY inj  Screening for AAA (abdominal aortic aneurysm)  Medicare annual wellness visit, subsequent    Recommendations for Preventive Services Due: see orders and patient instructions/AVS.  Recommended screening schedule for the next 5-10 years is provided to the patient in written form: see Patient Instructions/AVS.     Return for Medicare Annual Wellness Visit in 1 year.     Subjective   {OPTIONAL - WILL AUTO-DELETE IF NOT VB:[id number]    Patient's complete Health Risk Assessment and screening values have been reviewed and are found in Flowsheets. The following problems were reviewed today and where indicated follow up appointments were made and/or referrals ordered.    Positive Risk Factor Screenings with Interventions:        Depression:  PHQ-2 Score: 6  PHQ-9 Total Score: 7    Interpretation:  5-9 mild   10-14 moderate   15-19 moderately severe   20-27 severe     Interventions:  {Depression Interventions:210200097}    Alcohol Screening:  Alcohol Use: Heavy Drinker (05/17/2022)    AUDIT-C    . Frequency of Alcohol Consumption: 4 or more times a week    . Average Number of Drinks: 3 or 4    . Frequency of Binge Drinking: Never      AUDIT-C Score: 5   AUDIT Total Score: 5    Interpretation of AUDIT-C score:   3-7 indicates potential alcohol risk.    8 or more is associated with harmful or hazardous drinking.   65  or more in women, and 15 or more in men, is likely to indicate alcohol dependence.  Interventions:  {Alcohol Interventions:210200100}    {Medicare AWV Alcohol Counseling (Optional):[phone]}         General HRA Questions:  Select all that apply: (!) Loneliness, Social Isolation    Loneliness Interventions:  {Loneliness Interventions:201460090}    Social Isolation Interventions:  {Social Isolation [phone]      Activity, Diet, and Weight:  On average, how many days per week do you engage in moderate to strenuous exercise (like a brisk walk)?: 0 days       Do you eat balanced/healthy meals regularly?: Yes    Body mass index is 32.94 kg/m. (!) Abnormal      Inactivity Interventions:  {Inactivity Interventions:201460115}  Obesity Interventions:  {Obesity Interventions:201460099}    {Optional - Use if Billing for Obesity [phone]}               {OPTIONAL- LDCT, CVD, STI Counseling [phone]}            Objective   Vitals:    05/20/22 0928   BP: (!) 125/58   Site: Left Upper Arm   Position: Sitting   Cuff Size: Large Adult   Pulse: 75   Resp: 18   Temp: 97.7 F (36.5 C)   TempSrc: Temporal   SpO2: 97%   Weight: 107.1 kg (236 lb 3.2  oz)   Height: 1.803 m (5' 11"$ )      Body mass index is 32.94 kg/m.        {OPTIONAL - GENERAL PHYSICAL EXAM (WILL AUTO-DELETE IF NOT CL:[phone]       Allergies   Allergen Reactions   . Tramadol Itching     All over  All over       Prior to Visit Medications    Medication Sig Taking? Authorizing Provider   cyanocobalamin 1000 MCG tablet Take 1 tablet by mouth daily Yes [provider]   methocarbamol (ROBAXIN) 750 MG tablet Take 1 tablet by mouth as needed (pain) Yes [provider]   mirtazapine (REMERON) 30 MG tablet Take 1 tablet by mouth nightly Yes Saxby, Heather, APRN - CNP   valsartan (DIOVAN) 80 MG tablet Take 1 tablet by mouth daily Yes Saxby, Heather, APRN - CNP   metFORMIN (GLUCOPHAGE) 500 MG tablet Take 1 tablet by  mouth daily (with breakfast) Yes Saxby, Heather, APRN - CNP   tamsulosin (FLOMAX) 0.4 MG capsule Take 1 capsule by mouth daily Yes Saxby, Heather, APRN - CNP   ferrous sulfate 324 (65 Fe) MG EC tablet take 1 tablet by mouth three times a day STOP and NOTIFY DOCTOR I...  (REFER TO PRESCRIPTION NOTES). Yes [provider]   Melatonin 5 MG CAPS Take 5 capsules by mouth nightly Yes [provider]   acetaminophen (TYLENOL) 650 MG extended release tablet Take 1 tablet by mouth every 8 hours as needed Yes [provider]   famotidine (PEPCID) 20 MG tablet Take 1 tablet by mouth 2 times daily Yes Saxby, Heather, APRN - CNP   DULoxetine (CYMBALTA) 30 MG extended release capsule Take 1 capsule by mouth daily  Patient not taking: Reported on 05/20/2022  [provider]   omeprazole (PRILOSEC) 40 MG delayed release capsule Take 40 capsules by mouth daily  Patient not taking: Reported on 05/20/2022  [provider]       CareTeam (Including outside providers/suppliers regularly involved in providing care):   Patient Care Team:  Hale Bogus, APRN - CNP as PCP - General (Family Medicine)  Hale Bogus, APRN - CNP as PCP - Empaneled Provider     Reviewed and updated this visit:  Allergies  Meds  Med Hx  Surg Hx  Soc Hx  Fam Hx      I, Gabriel Carina, LPN, [id number], performed the documented evaluation under the direct supervision of the attending physician.

## 2022-05-20 NOTE — Progress Notes (Signed)
Chief Complaint   Patient presents with    Medicare AWV     1. Have you been to the ER, urgent care clinic since your last visit?  Hospitalized since your last visit?No    2. Have you seen or consulted any other health care providers outside of the [facility] since your last visit?  Include any pap smears or colon screening. No

## 2022-05-20 NOTE — Assessment & Plan Note (Signed)
Monitored by specialist- no acute findings meriting change in the plan

## 2022-05-20 NOTE — Patient Instructions (Addendum)
Please consider some counseling options while we wait for neuropsych testing:  [employer] [facility] (virtual or in person) [email or web address]  Telephone: [phone]  Fax: [phone]  [address] [place] [address]    [place]  [email or web address]  [address] [place] [address]   [phone]  Learning About Mindfulness for Stress  What are mindfulness and stress?     Stress is your body's response to a hard situation. Your body can have a physical, emotional, or mental response. A lot of things can cause stress. You may feel stress when you go on a job interview, take a test, or run a race. This kind of short-term stress is normal and even useful. It can help you if you need to work hard or react quickly.  Stress also can last a long time. Long-term stress is caused by stressful situations or events. Examples of long-term stress include long-term health problems, ongoing problems at work, and conflicts in your family. Long-term stress can harm your health.  Mindfulness is a focus only on things happening in the present moment. It's a process of purposefully paying attention to and being aware of your surroundings, your emotions, your thoughts, and how your body feels. You are aware of these things, but you aren't judging these experiences as "good" or "bad." Mindfulness can help you learn to calm your mind and body to help you cope with illness, pain, and stress.  How does mindfulness help to relieve stress?  Mindfulness can help quiet your mind and relax your body. Studies show that it can help some people sleep better, feel less anxious, and bring their blood pressure down. And it's been shown to help some people live and cope better with certain health problems like heart disease, depression, chronic pain, and cancer.  How do you practice mindfulness?  To be mindful is to pay attention, to be present, and to be accepting. Like any new skill or habit,  being mindful can take practice.  When you're mindful, you do just one thing and you pay close attention to that one thing. For example, you may sit quietly and notice your emotions or how your food tastes and smells.  When you're present, you focus on the things that are happening right now. You let go of your thoughts about the past and the future. When you dwell on the past or the future, you miss moments that can heal and strengthen you. You may miss moments like hearing a child laugh or seeing a friendly face when you think you're all alone.  When you're accepting, you don't judge the present moment. Instead you accept your thoughts and feelings as they come.  You can practice anytime, anywhere, and in any way you choose. You can practice in many ways. Here are a few ideas:  While doing your chores, like washing the dishes, let your mind focus on what's in your hand. What does the dish feel like? Is the water warm or cold?  Go outside and take a few deep breaths. What is the air like? Is it warm or cold?  When you can, take some time at the start of your day to sit alone and think.  Take a slow walk by yourself. Count your steps while you breathe in and out.  Try yoga breathing exercises, stretches, and poses to strengthen and relax your muscles.  At work, if you can, try to stop for a few moments each  hour. Note how your body feels. Let yourself regroup and let your mind [place] before you return to what you were doing.  If you struggle with anxiety or "worry thoughts," imagine your mind as a blue sky and your worry thoughts as clouds. Now imagine those worry thoughts floating across your mind's sky. Just let them pass by as you watch.  Follow-up care is a key part of your treatment and safety. Be sure to make and go to all appointments, and call your doctor if you are having problems. It's also a good idea to know your test results and keep a list of the medicines you take.  Where can you learn more?  Go to  https://www.bennett.info/ and enter M676 to learn more about "Learning About Mindfulness for Stress."  Current as of: September 26, 2021               Content Version: 13.9   2006-2023 Healthwise, Incorporated.   Care instructions adapted under license by [facility]. If you have questions about a medical condition or this instruction, always ask your healthcare professional. [place] any warranty or liability for your use of this information.           9 Ways to Cut Back on Drinking  Maybe you've found yourself drinking more alcohol than you'd prefer. If you want to cut back, here are some ideas to try.    Think before you drink.  Do you really want a drink, or is it just a habit? If you're used to having a drink at a certain time, try doing something else then.     Look for substitutes.  Find some no-alcohol drinks that you enjoy, like flavored seltzer water, tea with honey, or tonic with a slice of lime. Or try alcohol-free beer or "virgin" cocktails (without the alcohol).     Drink more water.  Use water to quench your thirst. Drink a glass of water before you have any alcohol. Have another glass along with every drink or between drinks.     Shrink your drink.  For example, have a bottle of beer instead of a pint. Use a smaller glass for wine. Choose drinks with lower alcohol content (ABV%). Or use less liquor and more mixer in cocktails.     Slow down.  It's easy to drink quickly and without thinking about it. Pay attention, and make each drink last longer.     Do the math.  Total up how much you spend on alcohol each month. How much is that a year? If you cut back, what could you do with the money you save?     Take a break.  Choose a day or two each week when you won't drink at all. Notice how you feel on those days, physically and emotionally. How did you sleep? Do you feel better? Over time, add more break days.     Count calories.  Would you like to lose some weight? For  some people that's a good motivator for cutting back. Figure out how many calories are in each drink. How many does that add up to in a day? In a week? In a month?     Practice saying no.  Be ready when someone offers you a drink. Try: "Thanks, I've had enough." Or "Thanks, but I'm cutting back." Or "No, thanks. I feel better when I drink less."   Current as of: June 22, 2021  Content Version: 13.9   2006-2023 Healthwise, Incorporated.   Care instructions adapted under license by [facility]. If you have questions about a medical condition or this instruction, always ask your healthcare professional. [place] any warranty or liability for your use of this information.         Learning About Emotional Support  When do you need emotional support?     You might find getting support from others helpful when you have a long-term health problem. Often people feel alone, confused, or scared when coping with an illness. But you aren't alone. Other people are going through the same thing you are and know how you feel.  Talking with others about your feelings can help you feel better.  Your family and friends can give you support. So can your doctor, a support group, or a church. If you have a support network, you will not feel as alone. You will learn new ways to deal with your situation, and you may try harder to overcome it.  Where you can get support  Family and friends: They can help you cope by giving you comfort and encouragement.  Counseling: Professional counseling can help you cope with situations that interfere with your life and cause stress. Counseling can help you understand and deal with your illness.  Your doctor: Find a doctor you trust and feel comfortable with. Be open and honest about your fears and concerns. Your doctor can help you get the right medical treatments, including counseling.  Spiritual or religious groups: They can provide comfort and may be able to  help you find counseling or other social support services.  Social groups: They can help you meet new people and get involved in activities you enjoy.  Community support groups: In a support group, you can talk to others who have dealt with the same problems or illness as you. You can encourage one another and learn ways to cope with tough emotions.  How can you find a support group?  Finding a support group that works for you may take time. There are many options. Some groups have a group leader who helps lead discussions or shares information. Others are less formal. Some meet in person, while others meet online.  Try using these resources to help you find the best support group for you.  Your doctor, health care team, or counselor.  People with the same health concern.  Your local church, mosque, synagogue, or other religious group.  A city, state, or national group that provides support for your health concern. Check your local [place] or community center for a list of these groups. Or look for information online.  Your local community, friends, and family.  Supportive relationships  A supportive relationship includes emotional support such as love, trust, and understanding, as well as advice and concrete help, such as help managing your time.  Reach out to others  Family and friends can help you. Ask them to:  Listen to you and give you encouragement. This can keep you from feeling hopeless or alone.  Help with small daily tasks or with bigger problems. A helping hand can keep you from feeling overwhelmed.  Help you manage a health problem. For example, ask them to go to doctor visits with you. Your loved ones can offer support by being involved in your medical care.  Respect your relationships  A good relationship is also a two-way street. You count on help from others, but they also count on  you.  Know your friends' limits. You don't have to see or call your friends every day. If you are going through a rough  patch, ask friends if you can contact them outside of the usual boundaries.  Don't always complain or talk about yourself. Know when it's time to stop talking and listen or just enjoy your friend's company.  Know that good friends can be a bad influence. For example, if a friend encourages you to drink when you know it will harm you, you may want to end the friendship.  Where can you learn more?  Go to https://www.bennett.info/ and enter G092 to learn more about "Learning About Emotional Support."  Current as of: September 26, 2021               Content Version: 13.9   2006-2023 Healthwise, Incorporated.   Care instructions adapted under license by [facility]. If you have questions about a medical condition or this instruction, always ask your healthcare professional. [place] any warranty or liability for your use of this information.           Learning About Being Active as an Older Adult  Why is being active important as you get older?     Being active is one of the best things you can do for your health. And it's never too late to start. Being active--or getting active, if you aren't already--has definite benefits. It can:  Give you more energy,  Keep your mind sharp.  Improve balance to reduce your risk of falls.  Help you manage chronic illness with fewer medicines.  No matter how old you are, how fit you are, or what health problems you have, there is a form of activity that will work for you. And the more physical activity you can do, the better your overall health will be.  What kinds of activity can help you stay healthy?  Being more active will make your daily activities easier. Physical activity includes planned exercise and things you do in daily life. There are four types of activity:  Aerobic.  Doing aerobic activity makes your heart and lungs strong.  Includes walking, dancing, and gardening.  Aim for at least 2 hours spread throughout the week.  It improves your  energy and can help you sleep better.  Muscle-strengthening.  This type of activity can help maintain muscle and strengthen bones.  Includes climbing stairs, using resistance bands, and lifting or carrying heavy loads.  Aim for at least twice a week.  It can help protect the knees and other joints.  Stretching.  Stretching gives you better range of motion in joints and muscles.  Includes upper arm stretches, calf stretches, and gentle yoga.  Aim for at least twice a week, preferably after your muscles are warmed up from other activities.  It can help you function better in daily life.  Balancing.  This helps you stay coordinated and have good posture.  Includes heel-to-toe walking, tai chi, and certain types of yoga.  Aim for at least 3 days a week.  It can reduce your risk of falling.  Even if you have a hard time meeting the recommendations, it's better to be more active than less active. All activity done in each category counts toward your weekly total. You'd be surprised how daily things like carrying groceries, keeping up with grandchildren, and taking the stairs can add up.  What keeps you from being active?  If you've had a hard time  being more active, you're not alone. Maybe you remember being able to do more. Or maybe you've never thought of yourself as being active. It's frustrating when you can't do the things you want. Being more active can help. What's holding you back?  Getting started.  Have a goal, but break it into easy tasks. Small steps build into big accomplishments.  Staying motivated.  If you feel like skipping your activity, remember your goal. Maybe you want to move better and stay independent. Every activity gets you one step closer.  Not feeling your best.  Start with 5 minutes of an activity you enjoy. Prove to yourself you can do it. As you get comfortable, increase your time.  You may not be where you want to be. But you're in the process of getting there. Everyone starts  somewhere.  How can you find safe ways to stay active?  Talk with your doctor about any physical challenges you're facing. Make a plan with your doctor if you have a health problem or aren't sure how to get started with activity.  If you're already active, ask your doctor if there is anything you should change to stay safe as your body and health change.  If you tend to feel dizzy after you take medicine, avoid activity at that time. Try being active before you take your medicine. This will reduce your risk of falls.  If you plan to be active at home, make sure to clear your space before you get started. Remove things like TV cords, coffee tables, and throw rugs. It's safest to have plenty of space to move freely.  The key to getting more active is to take it slow and steady. Try to improve only a little bit at a time. Pick just one area to improve on at first. And if an activity hurts, stop and talk to your doctor.  Where can you learn more?  Go to https://www.bennett.info/ and enter P600 to learn more about "Learning About Being Active as an Older Adult."  Current as of: September 07, 2021               Content Version: 13.9   2006-2023 Healthwise, Incorporated.   Care instructions adapted under license by [facility]. If you have questions about a medical condition or this instruction, always ask your healthcare professional. [place] any warranty or liability for your use of this information.           Advance Directives: Care Instructions  Overview  An advance directive is a legal way to state your wishes at the end of your life. It tells your family and your doctor what to do if you can't say what you want.  There are two main types of advance directives. You can change them any time your wishes change.  Living will.  This form tells your family and your doctor your wishes about life support and other treatment. The form is also called a declaration.  Medical power of  attorney.  This form lets you name a person to make treatment decisions for you when you can't speak for yourself. This person is called a health care agent (health care proxy, health care surrogate). The form is also called a durable power of attorney for health care.  If you do not have an advance directive, decisions about your medical care may be made by a family member, or by a doctor or a judge who doesn't know you.  It may  help to think of an advance directive as a gift to the people who care for you. If you have one, they won't have to make tough decisions by themselves.  For more information, including forms for your state, see the [place] website ([employer].hu).  Follow-up care is a key part of your treatment and safety. Be sure to make and go to all appointments, and call your doctor if you are having problems. It's also a good idea to know your test results and keep a list of the medicines you take.  What should you include in an advance directive?  Many states have a unique advance directive form. (It may ask you to address specific issues.) Or you might use a universal form that's approved by many states.  If your form doesn't tell you what to address, it may be hard to know what to include in your advance directive. Use the questions below to help you get started.  Who do you want to make decisions about your medical care if you are not able to?  What life-support measures do you want if you have a serious illness that gets worse over time or can't be cured?  What are you most afraid of that might happen? (Maybe you're afraid of having pain, losing your independence, or being kept alive by machines.)  Where would you prefer to die? (Your home? A hospital? A nursing home?)  Do you want to donate your organs when you die?  Do you want certain religious practices performed before you die?  When should you call for help?  Be sure to contact your doctor if you have any  questions.  Where can you learn more?  Go to https://www.bennett.info/ and enter R264 to learn more about "Advance Directives: Care Instructions."  Current as of: June 27, 2021               Content Version: 13.9   2006-2023 Healthwise, Incorporated.   Care instructions adapted under license by [facility]. If you have questions about a medical condition or this instruction, always ask your healthcare professional. [place] any warranty or liability for your use of this information.           Starting a Weight Loss Plan: Care Instructions  Overview     If you're thinking about losing weight, it can be hard to know where to start. Your doctor can help you set up a weight loss plan that best meets your needs. You may want to take a class on nutrition or exercise, or you could join a weight loss support group. If you have questions about how to make changes to your eating or exercise habits, ask your doctor about seeing a registered dietitian or an exercise specialist.  It can be a big challenge to lose weight. But you don't have to make huge changes at once. Make small changes, and stick with them. When those changes become habit, add a few more changes.  If you don't think you're ready to make changes right now, try to pick a date in the future. Make an appointment to see your doctor to discuss whether the time is right for you to start a plan.  Follow-up care is a key part of your treatment and safety. Be sure to make and go to all appointments, and call your doctor if you are having problems. It's also a good idea to know your test results and keep a list of the medicines you take.  How can you care for yourself at home?  Set realistic goals. Many people expect to lose much more weight than is likely. A weight loss of 5% to 10% of your body weight may be enough to improve your health.  Get family and friends involved to provide support. Talk to them about why you are trying  to lose weight, and ask them to help. They can help by participating in exercise and having meals with you, even if they may be eating something different.  Find what works best for you. If you do not have time or do not like to cook, a program that offers meal replacement bars or shakes may be better for you. Or if you like to prepare meals, finding a plan that includes daily menus and recipes may be best.  Ask your doctor about other health professionals who can help you achieve your weight loss goals.  A dietitian can help you make healthy changes in your diet.  An exercise specialist or personal trainer can help you develop a safe and effective exercise program.  A counselor or psychiatrist can help you cope with issues such as depression, anxiety, or family problems that can make it hard to focus on weight loss.  Consider joining a support group for people who are trying to lose weight. Your doctor can suggest groups in your area.  Where can you learn more?  Go to https://www.bennett.info/ and enter U357 to learn more about "Starting a Weight Loss Plan: Care Instructions."  Current as of: December 22, 2021               Content Version: 13.9   2006-2023 Healthwise, Incorporated.   Care instructions adapted under license by [facility]. If you have questions about a medical condition or this instruction, always ask your healthcare professional. [place] any warranty or liability for your use of this information.           A Healthy Heart: Care Instructions  Your Care Instructions     Coronary artery disease, also called heart disease, occurs when a substance called plaque builds up in the vessels that supply oxygen-rich blood to your heart muscle. This can narrow the blood vessels and reduce blood flow. A heart attack happens when blood flow is completely blocked. A high-fat diet, smoking, and other factors increase the risk of heart disease.  Your doctor has found that  you have a chance of having heart disease. You can do lots of things to keep your heart healthy. It may not be easy, but you can change your diet, exercise more, and quit smoking. These steps really work to lower your chance of heart disease.  Follow-up care is a key part of your treatment and safety. Be sure to make and go to all appointments, and call your doctor if you are having problems. It's also a good idea to know your test results and keep a list of the medicines you take.  How can you care for yourself at home?  Diet    Use less salt when you cook and eat. This helps lower your blood pressure. Taste food before salting. Add only a little salt when you think you need it. With time, your taste buds will adjust to less salt.     Eat fewer snack items, fast foods, canned soups, and other high-salt, high-fat, processed foods.     Read food labels and try to avoid saturated and trans fats. They increase your  risk of heart disease by raising cholesterol levels.     Limit the amount of solid fat-butter, margarine, and shortening-you eat. Use olive, peanut, or canola oil when you cook. Bake, broil, and steam foods instead of frying them.     Eat a variety of fruit and vegetables every day. Dark green, deep orange, red, or yellow fruits and vegetables are especially good for you. Examples include spinach, carrots, peaches, and berries.     Foods high in fiber can reduce your cholesterol and provide important vitamins and minerals. High-fiber foods include whole-grain cereals and breads, oatmeal, beans, brown rice, citrus fruits, and apples.     Eat lean proteins. Heart-healthy proteins include seafood, lean meats and poultry, eggs, beans, peas, nuts, seeds, and soy products.     Limit drinks and foods with added sugar. These include candy, desserts, and soda pop.   Lifestyle changes    If your doctor recommends it, get more exercise. Walking is a good choice. Bit by bit, increase the amount you walk every day. Try  for at least 30 minutes on most days of the week. You also may want to swim, bike, or do other activities.     Do not smoke. If you need help quitting, talk to your doctor about stop-smoking programs and medicines. These can increase your chances of quitting for good. Quitting smoking may be the most important step you can take to protect your heart. It is never too late to quit.     Limit alcohol to 2 drinks a day for men and 1 drink a day for women. Too much alcohol can cause health problems.     Manage other health problems such as diabetes, high blood pressure, and high cholesterol. If you think you may have a problem with alcohol or drug use, talk to your doctor.   Medicines    Take your medicines exactly as prescribed. Call your doctor if you think you are having a problem with your medicine.     If your doctor recommends aspirin, take the amount directed each day. Make sure you take aspirin and not another kind of pain reliever, such as acetaminophen (Tylenol).   When should you call for help?   Call 911 if you have symptoms of a heart attack. These may include:    Chest pain or pressure, or a strange feeling in the chest.     Sweating.     Shortness of breath.     Pain, pressure, or a strange feeling in the back, neck, jaw, or upper belly or in one or both shoulders or arms.     Lightheadedness or sudden weakness.     A fast or irregular heartbeat.   After you call 911, the operator may tell you to chew 1 adult-strength or 2 to 4 low-dose aspirin. Wait for an ambulance. Do not try to drive yourself.  Watch closely for changes in your health, and be sure to contact your doctor if you have any problems.  Where can you learn more?  Go to https://www.bennett.info/ and enter F075 to learn more about "A Healthy Heart: Care Instructions."  Current as of: September 26, 2021               Content Version: 13.9   2006-2023 Healthwise, Incorporated.   Care instructions adapted under license by [facility]. If  you have questions about a medical condition or this instruction, always ask your healthcare professional. Healthwise, Incorporated disclaims any warranty or  liability for your use of this information.      Personalized Preventive Plan for Patrick Welch - 05/20/2022  Medicare offers a range of preventive health benefits. Some of the tests and screenings are paid in full while other may be subject to a deductible, co-insurance, and/or copay.    Some of these benefits include a comprehensive review of your medical history including lifestyle, illnesses that may run in your family, and various assessments and screenings as appropriate.    After reviewing your medical record and screening and assessments performed today your provider may have ordered immunizations, labs, imaging, and/or referrals for you.  A list of these orders (if applicable) as well as your Preventive Care list are included within your After Visit Summary for your review.    Other Preventive Recommendations:    A preventive eye exam performed by an eye specialist is recommended every 1-2 years to screen for glaucoma; cataracts, macular degeneration, and other eye disorders.  A preventive dental visit is recommended every 6 months.  Try to get at least 150 minutes of exercise per week or 10,000 steps per day on a pedometer .  Order or download the FREE "Exercise & Physical Activity: Your Everyday Guide" from [employer]. Call ([phone] or search [employer] online.  You need 1200-1500 mg of calcium and 1000-2000 IU of vitamin D per day. It is possible to meet your calcium requirement with diet alone, but a vitamin D supplement is usually necessary to meet this goal.  When exposed to the sun, use a sunscreen that protects against both UVA and UVB radiation with an SPF of 30 or greater. Reapply every 2 to 3 hours or after sweating, drying off with a towel, or swimming.  Always wear a seat belt when traveling in  a car. Always wear a helmet when riding a bicycle or motorcycle.       Learning About Mindfulness for Stress  What are mindfulness and stress?     Stress is your body's response to a hard situation. Your body can have a physical, emotional, or mental response. A lot of things can cause stress. You may feel stress when you go on a job interview, take a test, or run a race. This kind of short-term stress is normal and even useful. It can help you if you need to work hard or react quickly.  Stress also can last a long time. Long-term stress is caused by stressful situations or events. Examples of long-term stress include long-term health problems, ongoing problems at work, and conflicts in your family. Long-term stress can harm your health.  Mindfulness is a focus only on things happening in the present moment. It's a process of purposefully paying attention to and being aware of your surroundings, your emotions, your thoughts, and how your body feels. You are aware of these things, but you aren't judging these experiences as "good" or "bad." Mindfulness can help you learn to calm your mind and body to help you cope with illness, pain, and stress.  How does mindfulness help to relieve stress?  Mindfulness can help quiet your mind and relax your body. Studies show that it can help some people sleep better, feel less anxious, and bring their blood pressure down. And it's been shown to help some people live and cope better with certain health problems like heart disease, depression, chronic pain, and cancer.  How do you practice mindfulness?  To be mindful is to  pay attention, to be present, and to be accepting. Like any new skill or habit, being mindful can take practice.  When you're mindful, you do just one thing and you pay close attention to that one thing. For example, you may sit quietly and notice your emotions or how your food tastes and smells.  When you're present, you focus on the things that are happening  right now. You let go of your thoughts about the past and the future. When you dwell on the past or the future, you miss moments that can heal and strengthen you. You may miss moments like hearing a child laugh or seeing a friendly face when you think you're all alone.  When you're accepting, you don't judge the present moment. Instead you accept your thoughts and feelings as they come.  You can practice anytime, anywhere, and in any way you choose. You can practice in many ways. Here are a few ideas:  While doing your chores, like washing the dishes, let your mind focus on what's in your hand. What does the dish feel like? Is the water warm or cold?  Go outside and take a few deep breaths. What is the air like? Is it warm or cold?  When you can, take some time at the start of your day to sit alone and think.  Take a slow walk by yourself. Count your steps while you breathe in and out.  Try yoga breathing exercises, stretches, and poses to strengthen and relax your muscles.  At work, if you can, try to stop for a few moments each hour. Note how your body feels. Let yourself regroup and let your mind [place] before you return to what you were doing.  If you struggle with anxiety or "worry thoughts," imagine your mind as a blue sky and your worry thoughts as clouds. Now imagine those worry thoughts floating across your mind's sky. Just let them pass by as you watch.  Follow-up care is a key part of your treatment and safety. Be sure to make and go to all appointments, and call your doctor if you are having problems. It's also a good idea to know your test results and keep a list of the medicines you take.  Where can you learn more?  Go to https://www.bennett.info/ and enter M676 to learn more about "Learning About Mindfulness for Stress."  Current as of: September 26, 2021               Content Version: 13.9   2006-2023 Healthwise, Incorporated.   Care instructions adapted under license by [facility]. If you  have questions about a medical condition or this instruction, always ask your healthcare professional. [place] any warranty or liability for your use of this information.           9 Ways to Cut Back on Drinking  Maybe you've found yourself drinking more alcohol than you'd prefer. If you want to cut back, here are some ideas to try.    Think before you drink.  Do you really want a drink, or is it just a habit? If you're used to having a drink at a certain time, try doing something else then.     Look for substitutes.  Find some no-alcohol drinks that you enjoy, like flavored seltzer water, tea with honey, or tonic with a slice of lime. Or try alcohol-free beer or "virgin" cocktails (without the alcohol).     Drink more water.  Use  water to quench your thirst. Drink a glass of water before you have any alcohol. Have another glass along with every drink or between drinks.     Shrink your drink.  For example, have a bottle of beer instead of a pint. Use a smaller glass for wine. Choose drinks with lower alcohol content (ABV%). Or use less liquor and more mixer in cocktails.     Slow down.  It's easy to drink quickly and without thinking about it. Pay attention, and make each drink last longer.     Do the math.  Total up how much you spend on alcohol each month. How much is that a year? If you cut back, what could you do with the money you save?     Take a break.  Choose a day or two each week when you won't drink at all. Notice how you feel on those days, physically and emotionally. How did you sleep? Do you feel better? Over time, add more break days.     Count calories.  Would you like to lose some weight? For some people that's a good motivator for cutting back. Figure out how many calories are in each drink. How many does that add up to in a day? In a week? In a month?     Practice saying no.  Be ready when someone offers you a drink. Try: "Thanks, I've had enough." Or "Thanks, but I'm  cutting back." Or "No, thanks. I feel better when I drink less."   Current as of: June 22, 2021               Content Version: 13.9   2006-2023 Healthwise, Incorporated.   Care instructions adapted under license by [facility]. If you have questions about a medical condition or this instruction, always ask your healthcare professional. [place] any warranty or liability for your use of this information.         Learning About Emotional Support  When do you need emotional support?     You might find getting support from others helpful when you have a long-term health problem. Often people feel alone, confused, or scared when coping with an illness. But you aren't alone. Other people are going through the same thing you are and know how you feel.  Talking with others about your feelings can help you feel better.  Your family and friends can give you support. So can your doctor, a support group, or a church. If you have a support network, you will not feel as alone. You will learn new ways to deal with your situation, and you may try harder to overcome it.  Where you can get support  Family and friends: They can help you cope by giving you comfort and encouragement.  Counseling: Professional counseling can help you cope with situations that interfere with your life and cause stress. Counseling can help you understand and deal with your illness.  Your doctor: Find a doctor you trust and feel comfortable with. Be open and honest about your fears and concerns. Your doctor can help you get the right medical treatments, including counseling.  Spiritual or religious groups: They can provide comfort and may be able to help you find counseling or other social support services.  Social groups: They can help you meet new people and get involved in activities you enjoy.  Community support groups: In a support group, you can talk to others who have dealt with the same  problems or illness as you. You  can encourage one another and learn ways to cope with tough emotions.  How can you find a support group?  Finding a support group that works for you may take time. There are many options. Some groups have a group leader who helps lead discussions or shares information. Others are less formal. Some meet in person, while others meet online.  Try using these resources to help you find the best support group for you.  Your doctor, health care team, or counselor.  People with the same health concern.  Your local church, mosque, synagogue, or other religious group.  A city, state, or national group that provides support for your health concern. Check your local [employer] or community center for a list of these groups. Or look for information online.  Your local community, friends, and family.  Supportive relationships  A supportive relationship includes emotional support such as love, trust, and understanding, as well as advice and concrete help, such as help managing your time.  Reach out to others  Family and friends can help you. Ask them to:  Listen to you and give you encouragement. This can keep you from feeling hopeless or alone.  Help with small daily tasks or with bigger problems. A helping hand can keep you from feeling overwhelmed.  Help you manage a health problem. For example, ask them to go to doctor visits with you. Your loved ones can offer support by being involved in your medical care.  Respect your relationships  A good relationship is also a two-way street. You count on help from others, but they also count on you.  Know your friends' limits. You don't have to see or call your friends every day. If you are going through a rough patch, ask friends if you can contact them outside of the usual boundaries.  Don't always complain or talk about yourself. Know when it's time to stop talking and listen or just enjoy your friend's company.  Know that good friends can be a bad influence. For example, if a friend  encourages you to drink when you know it will harm you, you may want to end the friendship.  Where can you learn more?  Go to https://www.bennett.info/ and enter G092 to learn more about "Learning About Emotional Support."  Current as of: September 26, 2021               Content Version: 13.9   2006-2023 Healthwise, Incorporated.   Care instructions adapted under license by [facility]. If you have questions about a medical condition or this instruction, always ask your healthcare professional. [place] any warranty or liability for your use of this information.           Learning About Being Active as an Older Adult  Why is being active important as you get older?     Being active is one of the best things you can do for your health. And it's never too late to start. Being active--or getting active, if you aren't already--has definite benefits. It can:  Give you more energy,  Keep your mind sharp.  Improve balance to reduce your risk of falls.  Help you manage chronic illness with fewer medicines.  No matter how old you are, how fit you are, or what health problems you have, there is a form of activity that will work for you. And the more physical activity you can do, the better your overall health will  be.  What kinds of activity can help you stay healthy?  Being more active will make your daily activities easier. Physical activity includes planned exercise and things you do in daily life. There are four types of activity:  Aerobic.  Doing aerobic activity makes your heart and lungs strong.  Includes walking, dancing, and gardening.  Aim for at least 2 hours spread throughout the week.  It improves your energy and can help you sleep better.  Muscle-strengthening.  This type of activity can help maintain muscle and strengthen bones.  Includes climbing stairs, using resistance bands, and lifting or carrying heavy loads.  Aim for at least twice a week.  It can help protect the knees  and other joints.  Stretching.  Stretching gives you better range of motion in joints and muscles.  Includes upper arm stretches, calf stretches, and gentle yoga.  Aim for at least twice a week, preferably after your muscles are warmed up from other activities.  It can help you function better in daily life.  Balancing.  This helps you stay coordinated and have good posture.  Includes heel-to-toe walking, tai chi, and certain types of yoga.  Aim for at least 3 days a week.  It can reduce your risk of falling.  Even if you have a hard time meeting the recommendations, it's better to be more active than less active. All activity done in each category counts toward your weekly total. You'd be surprised how daily things like carrying groceries, keeping up with grandchildren, and taking the stairs can add up.  What keeps you from being active?  If you've had a hard time being more active, you're not alone. Maybe you remember being able to do more. Or maybe you've never thought of yourself as being active. It's frustrating when you can't do the things you want. Being more active can help. What's holding you back?  Getting started.  Have a goal, but break it into easy tasks. Small steps build into big accomplishments.  Staying motivated.  If you feel like skipping your activity, remember your goal. Maybe you want to move better and stay independent. Every activity gets you one step closer.  Not feeling your best.  Start with 5 minutes of an activity you enjoy. Prove to yourself you can do it. As you get comfortable, increase your time.  You may not be where you want to be. But you're in the process of getting there. Everyone starts somewhere.  How can you find safe ways to stay active?  Talk with your doctor about any physical challenges you're facing. Make a plan with your doctor if you have a health problem or aren't sure how to get started with activity.  If you're already active, ask your doctor if there is anything  you should change to stay safe as your body and health change.  If you tend to feel dizzy after you take medicine, avoid activity at that time. Try being active before you take your medicine. This will reduce your risk of falls.  If you plan to be active at home, make sure to clear your space before you get started. Remove things like TV cords, coffee tables, and throw rugs. It's safest to have plenty of space to move freely.  The key to getting more active is to take it slow and steady. Try to improve only a little bit at a time. Pick just one area to improve on at first. And if an activity hurts, stop  and talk to your doctor.  Where can you learn more?  Go to https://www.bennett.info/ and enter P600 to learn more about "Learning About Being Active as an Older Adult."  Current as of: September 07, 2021               Content Version: 13.9   2006-2023 Healthwise, Incorporated.   Care instructions adapted under license by [facility]. If you have questions about a medical condition or this instruction, always ask your healthcare professional. [place] any warranty or liability for your use of this information.           Advance Directives: Care Instructions  Overview  An advance directive is a legal way to state your wishes at the end of your life. It tells your family and your doctor what to do if you can't say what you want.  There are two main types of advance directives. You can change them any time your wishes change.  Living will.  This form tells your family and your doctor your wishes about life support and other treatment. The form is also called a declaration.  Medical power of attorney.  This form lets you name a person to make treatment decisions for you when you can't speak for yourself. This person is called a health care agent (health care proxy, health care surrogate). The form is also called a durable power of attorney for health care.  If you do not have an advance  directive, decisions about your medical care may be made by a family member, or by a doctor or a judge who doesn't know you.  It may help to think of an advance directive as a gift to the people who care for you. If you have one, they won't have to make tough decisions by themselves.  For more information, including forms for your state, see the [place] website (Rebanking[employer].hu).  Follow-up care is a key part of your treatment and safety. Be sure to make and go to all appointments, and call your doctor if you are having problems. It's also a good idea to know your test results and keep a list of the medicines you take.  What should you include in an advance directive?  Many states have a unique advance directive form. (It may ask you to address specific issues.) Or you might use a universal form that's approved by many states.  If your form doesn't tell you what to address, it may be hard to know what to include in your advance directive. Use the questions below to help you get started.  Who do you want to make decisions about your medical care if you are not able to?  What life-support measures do you want if you have a serious illness that gets worse over time or can't be cured?  What are you most afraid of that might happen? (Maybe you're afraid of having pain, losing your independence, or being kept alive by machines.)  Where would you prefer to die? (Your home? A hospital? A nursing home?)  Do you want to donate your organs when you die?  Do you want certain religious practices performed before you die?  When should you call for help?  Be sure to contact your doctor if you have any questions.  Where can you learn more?  Go to https://www.bennett.info/ and enter R264 to learn more about "Advance Directives: Care Instructions."  Current as of: June 27, 2021  Content Version: 13.9   2006-2023 Healthwise, Incorporated.   Care instructions adapted under  license by [facility]. If you have questions about a medical condition or this instruction, always ask your healthcare professional. [place] any warranty or liability for your use of this information.           Starting a Weight Loss Plan: Care Instructions  Overview     If you're thinking about losing weight, it can be hard to know where to start. Your doctor can help you set up a weight loss plan that best meets your needs. You may want to take a class on nutrition or exercise, or you could join a weight loss support group. If you have questions about how to make changes to your eating or exercise habits, ask your doctor about seeing a registered dietitian or an exercise specialist.  It can be a big challenge to lose weight. But you don't have to make huge changes at once. Make small changes, and stick with them. When those changes become habit, add a few more changes.  If you don't think you're ready to make changes right now, try to pick a date in the future. Make an appointment to see your doctor to discuss whether the time is right for you to start a plan.  Follow-up care is a key part of your treatment and safety. Be sure to make and go to all appointments, and call your doctor if you are having problems. It's also a good idea to know your test results and keep a list of the medicines you take.  How can you care for yourself at home?  Set realistic goals. Many people expect to lose much more weight than is likely. A weight loss of 5% to 10% of your body weight may be enough to improve your health.  Get family and friends involved to provide support. Talk to them about why you are trying to lose weight, and ask them to help. They can help by participating in exercise and having meals with you, even if they may be eating something different.  Find what works best for you. If you do not have time or do not like to cook, a program that offers meal replacement bars or shakes may be  better for you. Or if you like to prepare meals, finding a plan that includes daily menus and recipes may be best.  Ask your doctor about other health professionals who can help you achieve your weight loss goals.  A dietitian can help you make healthy changes in your diet.  An exercise specialist or personal trainer can help you develop a safe and effective exercise program.  A counselor or psychiatrist can help you cope with issues such as depression, anxiety, or family problems that can make it hard to focus on weight loss.  Consider joining a support group for people who are trying to lose weight. Your doctor can suggest groups in your area.  Where can you learn more?  Go to https://www.bennett.info/ and enter U357 to learn more about "Starting a Weight Loss Plan: Care Instructions."  Current as of: December 22, 2021               Content Version: 13.9   2006-2023 Healthwise, Incorporated.   Care instructions adapted under license by [facility]. If you have questions about a medical condition or this instruction, always ask your healthcare professional. [place] any warranty or liability for your use  of this information.           A Healthy Heart: Care Instructions  Your Care Instructions     Coronary artery disease, also called heart disease, occurs when a substance called plaque builds up in the vessels that supply oxygen-rich blood to your heart muscle. This can narrow the blood vessels and reduce blood flow. A heart attack happens when blood flow is completely blocked. A high-fat diet, smoking, and other factors increase the risk of heart disease.  Your doctor has found that you have a chance of having heart disease. You can do lots of things to keep your heart healthy. It may not be easy, but you can change your diet, exercise more, and quit smoking. These steps really work to lower your chance of heart disease.  Follow-up care is a key part of your treatment and  safety. Be sure to make and go to all appointments, and call your doctor if you are having problems. It's also a good idea to know your test results and keep a list of the medicines you take.  How can you care for yourself at home?  Diet    Use less salt when you cook and eat. This helps lower your blood pressure. Taste food before salting. Add only a little salt when you think you need it. With time, your taste buds will adjust to less salt.     Eat fewer snack items, fast foods, canned soups, and other high-salt, high-fat, processed foods.     Read food labels and try to avoid saturated and trans fats. They increase your risk of heart disease by raising cholesterol levels.     Limit the amount of solid fat-butter, margarine, and shortening-you eat. Use olive, peanut, or canola oil when you cook. Bake, broil, and steam foods instead of frying them.     Eat a variety of fruit and vegetables every day. Dark green, deep orange, red, or yellow fruits and vegetables are especially good for you. Examples include spinach, carrots, peaches, and berries.     Foods high in fiber can reduce your cholesterol and provide important vitamins and minerals. High-fiber foods include whole-grain cereals and breads, oatmeal, beans, brown rice, citrus fruits, and apples.     Eat lean proteins. Heart-healthy proteins include seafood, lean meats and poultry, eggs, beans, peas, nuts, seeds, and soy products.     Limit drinks and foods with added sugar. These include candy, desserts, and soda pop.   Lifestyle changes    If your doctor recommends it, get more exercise. Walking is a good choice. Bit by bit, increase the amount you walk every day. Try for at least 30 minutes on most days of the week. You also may want to swim, bike, or do other activities.     Do not smoke. If you need help quitting, talk to your doctor about stop-smoking programs and medicines. These can increase your chances of quitting for good. Quitting smoking may be the  most important step you can take to protect your heart. It is never too late to quit.     Limit alcohol to 2 drinks a day for men and 1 drink a day for women. Too much alcohol can cause health problems.     Manage other health problems such as diabetes, high blood pressure, and high cholesterol. If you think you may have a problem with alcohol or drug use, talk to your doctor.   Medicines    Take your  medicines exactly as prescribed. Call your doctor if you think you are having a problem with your medicine.     If your doctor recommends aspirin, take the amount directed each day. Make sure you take aspirin and not another kind of pain reliever, such as acetaminophen (Tylenol).   When should you call for help?   Call 911 if you have symptoms of a heart attack. These may include:    Chest pain or pressure, or a strange feeling in the chest.     Sweating.     Shortness of breath.     Pain, pressure, or a strange feeling in the back, neck, jaw, or upper belly or in one or both shoulders or arms.     Lightheadedness or sudden weakness.     A fast or irregular heartbeat.   After you call 911, the operator may tell you to chew 1 adult-strength or 2 to 4 low-dose aspirin. Wait for an ambulance. Do not try to drive yourself.  Watch closely for changes in your health, and be sure to contact your doctor if you have any problems.  Where can you learn more?  Go to https://www.bennett.info/ and enter F075 to learn more about "A Healthy Heart: Care Instructions."  Current as of: September 26, 2021               Content Version: 13.9   2006-2023 Healthwise, Incorporated.   Care instructions adapted under license by [facility]. If you have questions about a medical condition or this instruction, always ask your healthcare professional. [place] any warranty or liability for your use of this information.      Personalized Preventive Plan for Patrick Welch - 05/20/2022  Medicare offers a range of  preventive health benefits. Some of the tests and screenings are paid in full while other may be subject to a deductible, co-insurance, and/or copay.    Some of these benefits include a comprehensive review of your medical history including lifestyle, illnesses that may run in your family, and various assessments and screenings as appropriate.    After reviewing your medical record and screening and assessments performed today your provider may have ordered immunizations, labs, imaging, and/or referrals for you.  A list of these orders (if applicable) as well as your Preventive Care list are included within your After Visit Summary for your review.    Other Preventive Recommendations:    A preventive eye exam performed by an eye specialist is recommended every 1-2 years to screen for glaucoma; cataracts, macular degeneration, and other eye disorders.  A preventive dental visit is recommended every 6 months.  Try to get at least 150 minutes of exercise per week or 10,000 steps per day on a pedometer .  Order or download the FREE "Exercise & Physical Activity: Your Everyday Guide" from [employer]. Call [phone] or search [employer] online.  You need 1200-1500 mg of calcium and 1000-2000 IU of vitamin D per day. It is possible to meet your calcium requirement with diet alone, but a vitamin D supplement is usually necessary to meet this goal.  When exposed to the sun, use a sunscreen that protects against both UVA and UVB radiation with an SPF of 30 or greater. Reapply every 2 to 3 hours or after sweating, drying off with a towel, or swimming.  Always wear a seat belt when traveling in a car. Always wear a helmet when riding a bicycle or  motorcycle.       Learning About Mindfulness for Stress  What are mindfulness and stress?     Stress is your body's response to a hard situation. Your body can have a physical, emotional, or mental response. A lot of things can cause  stress. You may feel stress when you go on a job interview, take a test, or run a race. This kind of short-term stress is normal and even useful. It can help you if you need to work hard or react quickly.  Stress also can last a long time. Long-term stress is caused by stressful situations or events. Examples of long-term stress include long-term health problems, ongoing problems at work, and conflicts in your family. Long-term stress can harm your health.  Mindfulness is a focus only on things happening in the present moment. It's a process of purposefully paying attention to and being aware of your surroundings, your emotions, your thoughts, and how your body feels. You are aware of these things, but you aren't judging these experiences as "good" or "bad." Mindfulness can help you learn to calm your mind and body to help you cope with illness, pain, and stress.  How does mindfulness help to relieve stress?  Mindfulness can help quiet your mind and relax your body. Studies show that it can help some people sleep better, feel less anxious, and bring their blood pressure down. And it's been shown to help some people live and cope better with certain health problems like heart disease, depression, chronic pain, and cancer.  How do you practice mindfulness?  To be mindful is to pay attention, to be present, and to be accepting. Like any new skill or habit, being mindful can take practice.  When you're mindful, you do just one thing and you pay close attention to that one thing. For example, you may sit quietly and notice your emotions or how your food tastes and smells.  When you're present, you focus on the things that are happening right now. You let go of your thoughts about the past and the future. When you dwell on the past or the future, you miss moments that can heal and strengthen you. You may miss moments like hearing a child laugh or seeing a friendly face when you think you're all alone.  When you're  accepting, you don't judge the present moment. Instead you accept your thoughts and feelings as they come.  You can practice anytime, anywhere, and in any way you choose. You can practice in many ways. Here are a few ideas:  While doing your chores, like washing the dishes, let your mind focus on what's in your hand. What does the dish feel like? Is the water warm or cold?  Go outside and take a few deep breaths. What is the air like? Is it warm or cold?  When you can, take some time at the start of your day to sit alone and think.  Take a slow walk by yourself. Count your steps while you breathe in and out.  Try yoga breathing exercises, stretches, and poses to strengthen and relax your muscles.  At work, if you can, try to stop for a few moments each hour. Note how your body feels. Let yourself regroup and let your mind [place] before you return to what you were doing.  If you struggle with anxiety or "worry thoughts," imagine your mind as a blue sky and your worry thoughts as clouds. Now imagine those worry thoughts floating  across your mind's sky. Just let them pass by as you watch.  Follow-up care is a key part of your treatment and safety. Be sure to make and go to all appointments, and call your doctor if you are having problems. It's also a good idea to know your test results and keep a list of the medicines you take.  Where can you learn more?  Go to https://www.bennett.info/ and enter M676 to learn more about "Learning About Mindfulness for Stress."  Current as of: September 26, 2021               Content Version: 13.9   2006-2023 Healthwise, Incorporated.   Care instructions adapted under license by [facility]. If you have questions about a medical condition or this instruction, always ask your healthcare professional. [place] any warranty or liability for your use of this information.           9 Ways to Cut Back on Drinking  Maybe you've found yourself drinking more  alcohol than you'd prefer. If you want to cut back, here are some ideas to try.    Think before you drink.  Do you really want a drink, or is it just a habit? If you're used to having a drink at a certain time, try doing something else then.     Look for substitutes.  Find some no-alcohol drinks that you enjoy, like flavored seltzer water, tea with honey, or tonic with a slice of lime. Or try alcohol-free beer or "virgin" cocktails (without the alcohol).     Drink more water.  Use water to quench your thirst. Drink a glass of water before you have any alcohol. Have another glass along with every drink or between drinks.     Shrink your drink.  For example, have a bottle of beer instead of a pint. Use a smaller glass for wine. Choose drinks with lower alcohol content (ABV%). Or use less liquor and more mixer in cocktails.     Slow down.  It's easy to drink quickly and without thinking about it. Pay attention, and make each drink last longer.     Do the math.  Total up how much you spend on alcohol each month. How much is that a year? If you cut back, what could you do with the money you save?     Take a break.  Choose a day or two each week when you won't drink at all. Notice how you feel on those days, physically and emotionally. How did you sleep? Do you feel better? Over time, add more break days.     Count calories.  Would you like to lose some weight? For some people that's a good motivator for cutting back. Figure out how many calories are in each drink. How many does that add up to in a day? In a week? In a month?     Practice saying no.  Be ready when someone offers you a drink. Try: "Thanks, I've had enough." Or "Thanks, but I'm cutting back." Or "No, thanks. I feel better when I drink less."   Current as of: June 22, 2021               Content Version: 13.9   2006-2023 Healthwise, Incorporated.   Care instructions adapted under license by [facility]. If you have questions about a medical condition or  this instruction, always ask your healthcare professional. [place] any warranty or liability for  your use of this information.         Learning About Emotional Support  When do you need emotional support?     You might find getting support from others helpful when you have a long-term health problem. Often people feel alone, confused, or scared when coping with an illness. But you aren't alone. Other people are going through the same thing you are and know how you feel.  Talking with others about your feelings can help you feel better.  Your family and friends can give you support. So can your doctor, a support group, or a church. If you have a support network, you will not feel as alone. You will learn new ways to deal with your situation, and you may try harder to overcome it.  Where you can get support  Family and friends: They can help you cope by giving you comfort and encouragement.  Counseling: Professional counseling can help you cope with situations that interfere with your life and cause stress. Counseling can help you understand and deal with your illness.  Your doctor: Find a doctor you trust and feel comfortable with. Be open and honest about your fears and concerns. Your doctor can help you get the right medical treatments, including counseling.  Spiritual or religious groups: They can provide comfort and may be able to help you find counseling or other social support services.  Social groups: They can help you meet new people and get involved in activities you enjoy.  Community support groups: In a support group, you can talk to others who have dealt with the same problems or illness as you. You can encourage one another and learn ways to cope with tough emotions.  How can you find a support group?  Finding a support group that works for you may take time. There are many options. Some groups have a group leader who helps lead discussions or shares information. Others are  less formal. Some meet in person, while others meet online.  Try using these resources to help you find the best support group for you.  Your doctor, health care team, or counselor.  People with the same health concern.  Your local church, mosque, synagogue, or other religious group.  A city, state, or national group that provides support for your health concern. Check your local [place] or community center for a list of these groups. Or look for information online.  Your local community, friends, and family.  Supportive relationships  A supportive relationship includes emotional support such as love, trust, and understanding, as well as advice and concrete help, such as help managing your time.  Reach out to others  Family and friends can help you. Ask them to:  Listen to you and give you encouragement. This can keep you from feeling hopeless or alone.  Help with small daily tasks or with bigger problems. A helping hand can keep you from feeling overwhelmed.  Help you manage a health problem. For example, ask them to go to doctor visits with you. Your loved ones can offer support by being involved in your medical care.  Respect your relationships  A good relationship is also a two-way street. You count on help from others, but they also count on you.  Know your friends' limits. You don't have to see or call your friends every day. If you are going through a rough patch, ask friends if you can contact them outside of the usual boundaries.  Don't always complain or  talk about yourself. Know when it's time to stop talking and listen or just enjoy your friend's company.  Know that good friends can be a bad influence. For example, if a friend encourages you to drink when you know it will harm you, you may want to end the friendship.  Where can you learn more?  Go to https://www.bennett.info/ and enter G092 to learn more about "Learning About Emotional Support."  Current as of: September 26, 2021                Content Version: 13.9   2006-2023 Healthwise, Incorporated.   Care instructions adapted under license by [facility]. If you have questions about a medical condition or this instruction, always ask your healthcare professional. [place] any warranty or liability for your use of this information.           Learning About Being Active as an Older Adult  Why is being active important as you get older?     Being active is one of the best things you can do for your health. And it's never too late to start. Being active--or getting active, if you aren't already--has definite benefits. It can:  Give you more energy,  Keep your mind sharp.  Improve balance to reduce your risk of falls.  Help you manage chronic illness with fewer medicines.  No matter how old you are, how fit you are, or what health problems you have, there is a form of activity that will work for you. And the more physical activity you can do, the better your overall health will be.  What kinds of activity can help you stay healthy?  Being more active will make your daily activities easier. Physical activity includes planned exercise and things you do in daily life. There are four types of activity:  Aerobic.  Doing aerobic activity makes your heart and lungs strong.  Includes walking, dancing, and gardening.  Aim for at least 2 hours spread throughout the week.  It improves your energy and can help you sleep better.  Muscle-strengthening.  This type of activity can help maintain muscle and strengthen bones.  Includes climbing stairs, using resistance bands, and lifting or carrying heavy loads.  Aim for at least twice a week.  It can help protect the knees and other joints.  Stretching.  Stretching gives you better range of motion in joints and muscles.  Includes upper arm stretches, calf stretches, and gentle yoga.  Aim for at least twice a week, preferably after your muscles are warmed up from other activities.  It can help  you function better in daily life.  Balancing.  This helps you stay coordinated and have good posture.  Includes heel-to-toe walking, tai chi, and certain types of yoga.  Aim for at least 3 days a week.  It can reduce your risk of falling.  Even if you have a hard time meeting the recommendations, it's better to be more active than less active. All activity done in each category counts toward your weekly total. You'd be surprised how daily things like carrying groceries, keeping up with grandchildren, and taking the stairs can add up.  What keeps you from being active?  If you've had a hard time being more active, you're not alone. Maybe you remember being able to do more. Or maybe you've never thought of yourself as being active. It's frustrating when you can't do the things you want. Being more active can help. What's holding you  back?  Getting started.  Have a goal, but break it into easy tasks. Small steps build into big accomplishments.  Staying motivated.  If you feel like skipping your activity, remember your goal. Maybe you want to move better and stay independent. Every activity gets you one step closer.  Not feeling your best.  Start with 5 minutes of an activity you enjoy. Prove to yourself you can do it. As you get comfortable, increase your time.  You may not be where you want to be. But you're in the process of getting there. Everyone starts somewhere.  How can you find safe ways to stay active?  Talk with your doctor about any physical challenges you're facing. Make a plan with your doctor if you have a health problem or aren't sure how to get started with activity.  If you're already active, ask your doctor if there is anything you should change to stay safe as your body and health change.  If you tend to feel dizzy after you take medicine, avoid activity at that time. Try being active before you take your medicine. This will reduce your risk of falls.  If you plan to be active at home, make sure to  clear your space before you get started. Remove things like TV cords, coffee tables, and throw rugs. It's safest to have plenty of space to move freely.  The key to getting more active is to take it slow and steady. Try to improve only a little bit at a time. Pick just one area to improve on at first. And if an activity hurts, stop and talk to your doctor.  Where can you learn more?  Go to https://www.bennett.info/ and enter P600 to learn more about "Learning About Being Active as an Older Adult."  Current as of: September 07, 2021               Content Version: 13.9   2006-2023 Healthwise, Incorporated.   Care instructions adapted under license by [facility]. If you have questions about a medical condition or this instruction, always ask your healthcare professional. [place] any warranty or liability for your use of this information.           Starting a Weight Loss Plan: Care Instructions  Overview     If you're thinking about losing weight, it can be hard to know where to start. Your doctor can help you set up a weight loss plan that best meets your needs. You may want to take a class on nutrition or exercise, or you could join a weight loss support group. If you have questions about how to make changes to your eating or exercise habits, ask your doctor about seeing a registered dietitian or an exercise specialist.  It can be a big challenge to lose weight. But you don't have to make huge changes at once. Make small changes, and stick with them. When those changes become habit, add a few more changes.  If you don't think you're ready to make changes right now, try to pick a date in the future. Make an appointment to see your doctor to discuss whether the time is right for you to start a plan.  Follow-up care is a key part of your treatment and safety. Be sure to make and go to all appointments, and call your doctor if you are having problems. It's also a good idea to know your  test results and keep a list of the medicines  you take.  How can you care for yourself at home?  Set realistic goals. Many people expect to lose much more weight than is likely. A weight loss of 5% to 10% of your body weight may be enough to improve your health.  Get family and friends involved to provide support. Talk to them about why you are trying to lose weight, and ask them to help. They can help by participating in exercise and having meals with you, even if they may be eating something different.  Find what works best for you. If you do not have time or do not like to cook, a program that offers meal replacement bars or shakes may be better for you. Or if you like to prepare meals, finding a plan that includes daily menus and recipes may be best.  Ask your doctor about other health professionals who can help you achieve your weight loss goals.  A dietitian can help you make healthy changes in your diet.  An exercise specialist or personal trainer can help you develop a safe and effective exercise program.  A counselor or psychiatrist can help you cope with issues such as depression, anxiety, or family problems that can make it hard to focus on weight loss.  Consider joining a support group for people who are trying to lose weight. Your doctor can suggest groups in your area.  Where can you learn more?  Go to https://www.bennett.info/ and enter U357 to learn more about "Starting a Weight Loss Plan: Care Instructions."  Current as of: December 22, 2021               Content Version: 13.9   2006-2023 Healthwise, Incorporated.   Care instructions adapted under license by [facility]. If you have questions about a medical condition or this instruction, always ask your healthcare professional. [place] any warranty or liability for your use of this information.           A Healthy Heart: Care Instructions  Your Care Instructions     Coronary artery disease, also called heart  disease, occurs when a substance called plaque builds up in the vessels that supply oxygen-rich blood to your heart muscle. This can narrow the blood vessels and reduce blood flow. A heart attack happens when blood flow is completely blocked. A high-fat diet, smoking, and other factors increase the risk of heart disease.  Your doctor has found that you have a chance of having heart disease. You can do lots of things to keep your heart healthy. It may not be easy, but you can change your diet, exercise more, and quit smoking. These steps really work to lower your chance of heart disease.  Follow-up care is a key part of your treatment and safety. Be sure to make and go to all appointments, and call your doctor if you are having problems. It's also a good idea to know your test results and keep a list of the medicines you take.  How can you care for yourself at home?  Diet    Use less salt when you cook and eat. This helps lower your blood pressure. Taste food before salting. Add only a little salt when you think you need it. With time, your taste buds will adjust to less salt.     Eat fewer snack items, fast foods, canned soups, and other high-salt, high-fat, processed foods.     Read food labels and try to avoid saturated and trans fats. They  increase your risk of heart disease by raising cholesterol levels.     Limit the amount of solid fat-butter, margarine, and shortening-you eat. Use olive, peanut, or canola oil when you cook. Bake, broil, and steam foods instead of frying them.     Eat a variety of fruit and vegetables every day. Dark green, deep orange, red, or yellow fruits and vegetables are especially good for you. Examples include spinach, carrots, peaches, and berries.     Foods high in fiber can reduce your cholesterol and provide important vitamins and minerals. High-fiber foods include whole-grain cereals and breads, oatmeal, beans, brown rice, citrus fruits, and apples.     Eat lean proteins.  Heart-healthy proteins include seafood, lean meats and poultry, eggs, beans, peas, nuts, seeds, and soy products.     Limit drinks and foods with added sugar. These include candy, desserts, and soda pop.   Lifestyle changes    If your doctor recommends it, get more exercise. Walking is a good choice. Bit by bit, increase the amount you walk every day. Try for at least 30 minutes on most days of the week. You also may want to swim, bike, or do other activities.     Do not smoke. If you need help quitting, talk to your doctor about stop-smoking programs and medicines. These can increase your chances of quitting for good. Quitting smoking may be the most important step you can take to protect your heart. It is never too late to quit.     Limit alcohol to 2 drinks a day for men and 1 drink a day for women. Too much alcohol can cause health problems.     Manage other health problems such as diabetes, high blood pressure, and high cholesterol. If you think you may have a problem with alcohol or drug use, talk to your doctor.   Medicines    Take your medicines exactly as prescribed. Call your doctor if you think you are having a problem with your medicine.     If your doctor recommends aspirin, take the amount directed each day. Make sure you take aspirin and not another kind of pain reliever, such as acetaminophen (Tylenol).   When should you call for help?   Call 911 if you have symptoms of a heart attack. These may include:    Chest pain or pressure, or a strange feeling in the chest.     Sweating.     Shortness of breath.     Pain, pressure, or a strange feeling in the back, neck, jaw, or upper belly or in one or both shoulders or arms.     Lightheadedness or sudden weakness.     A fast or irregular heartbeat.   After you call 911, the operator may tell you to chew 1 adult-strength or 2 to 4 low-dose aspirin. Wait for an ambulance. Do not try to drive yourself.  Watch closely for changes in your health, and be sure to  contact your doctor if you have any problems.  Where can you learn more?  Go to https://www.bennett.info/ and enter F075 to learn more about "A Healthy Heart: Care Instructions."  Current as of: September 26, 2021               Content Version: 13.9   2006-2023 [employer].   Care instructions adapted under license by [facility]. If you have questions about a medical condition or this instruction, always ask your healthcare professional. [place] any  warranty or liability for your use of this information.      Personalized Preventive Plan for [place] - 05/20/2022  Medicare offers a range of preventive health benefits. Some of the tests and screenings are paid in full while other may be subject to a deductible, co-insurance, and/or copay.    Some of these benefits include a comprehensive review of your medical history including lifestyle, illnesses that may run in your family, and various assessments and screenings as appropriate.    After reviewing your medical record and screening and assessments performed today your provider may have ordered immunizations, labs, imaging, and/or referrals for you.  A list of these orders (if applicable) as well as your Preventive Care list are included within your After Visit Summary for your review.    Other Preventive Recommendations:    A preventive eye exam performed by an eye specialist is recommended every 1-2 years to screen for glaucoma; cataracts, macular degeneration, and other eye disorders.  A preventive dental visit is recommended every 6 months.  Try to get at least 150 minutes of exercise per week or 10,000 steps per day on a pedometer .  Order or download the FREE "Exercise & Physical Activity: Your Everyday Guide" from [employer]. Call (267)018-2030 or search [employer] online.  You need 1200-1500 mg of calcium and 1000-2000 IU of vitamin D per day. It is possible to meet  your calcium requirement with diet alone, but a vitamin D supplement is usually necessary to meet this goal.  When exposed to the sun, use a sunscreen that protects against both UVA and UVB radiation with an SPF of 30 or greater. Reapply every 2 to 3 hours or after sweating, drying off with a towel, or swimming.  Always wear a seat belt when traveling in a car. Always wear a helmet when riding a bicycle or motorcycle.

## 2022-05-31 ENCOUNTER — Inpatient Hospital Stay: Admit: 2022-05-31 | Payer: MEDICARE | Primary: Family Medicine

## 2022-05-31 DIAGNOSIS — Z136 Encounter for screening for cardiovascular disorders: Secondary | ICD-10-CM

## 2022-05-31 LAB — VAS AAA SCREENING
Ao dist AP: 1.3 cm
Ao dist TR: 1.7 cm
Ao mid AP: 2.1 cm
Ao mid TR: 2.3 cm
Ao prox AP: 3 cm
Ao prox TR: 2.8 cm
Left CIA AP: 1.2 cm
Left CIA TR: 1.4 cm
Right CIA AP: 1.6 cm
Right CIA TR: 1.3 cm

## 2022-06-15 ENCOUNTER — Encounter

## 2022-06-15 ENCOUNTER — Inpatient Hospital Stay: Admit: 2022-06-15 | Payer: MEDICARE | Primary: Family Medicine

## 2022-06-15 DIAGNOSIS — M47819 Spondylosis without myelopathy or radiculopathy, site unspecified: Secondary | ICD-10-CM

## 2022-06-24 NOTE — Telephone Encounter (Signed)
[facility] mail order pharmacy faxed 90 day refill request:    Tamsulosin HCL caps 0.4 mg  Directions:  Take 1 capsule daily  Quantity: 90    Ldm

## 2022-06-27 MED ORDER — TAMSULOSIN HCL 0.4 MG PO CAPS
0.4 MG | ORAL_CAPSULE | Freq: Every day | ORAL | 1 refills | Status: DC
Start: 2022-06-27 — End: 2023-01-16

## 2022-07-02 ENCOUNTER — Encounter

## 2022-07-04 NOTE — Telephone Encounter (Signed)
Chief Complaint   Patient presents with    Medication Refill       Requested Prescriptions     Pending Prescriptions Disp Refills    famotidine (PEPCID) 20 MG tablet 180 tablet 1     Sig: Take 1 tablet by mouth 2 times daily       Allergies:  Allergies   Allergen Reactions    Tramadol Itching     All over  All over         Last visit with clinic:  11/17/2021   Next visit with clinic: Visit date not found     Last visit with this provider: 05/20/2022   Next Visit with this provider: 11/18/2022    Signed by Janyce Llanos, [place]  07/04/22  11:20 AM

## 2022-07-05 MED ORDER — FAMOTIDINE 20 MG PO TABS
20 | ORAL_TABLET | Freq: Two times a day (BID) | ORAL | 1 refills | Status: AC
Start: 2022-07-05 — End: ?

## 2022-07-19 MED ORDER — VALSARTAN 80 MG PO TABS
80 MG | ORAL_TABLET | Freq: Every day | ORAL | 1 refills | Status: DC
Start: 2022-07-19 — End: 2023-02-08

## 2022-08-01 ENCOUNTER — Encounter

## 2022-08-01 MED ORDER — METFORMIN HCL 500 MG PO TABS
500 MG | ORAL_TABLET | Freq: Every day | ORAL | 1 refills | Status: DC
Start: 2022-08-01 — End: 2023-01-30

## 2022-08-01 NOTE — Telephone Encounter (Signed)
Faxed refill request:     Metformin HCL 500mg  tabs  Qty:90   Refills:3    [facility]

## 2022-08-04 ENCOUNTER — Encounter

## 2022-10-24 MED ORDER — MIRTAZAPINE 30 MG PO TABS
30 MG | ORAL_TABLET | Freq: Every evening | ORAL | 1 refills | Status: DC
Start: 2022-10-24 — End: 2023-05-15

## 2022-10-24 NOTE — Telephone Encounter (Signed)
Faxed refill request:     Mirtazapine 30mg  tabs  Qty:90   Refills:3    [facility]

## 2022-11-03 ENCOUNTER — Encounter

## 2022-11-03 MED ORDER — AMLODIPINE BESYLATE 10 MG PO TABS
10 MG | ORAL_TABLET | Freq: Every day | ORAL | 1 refills | Status: DC
Start: 2022-11-03 — End: 2023-01-31

## 2022-11-18 ENCOUNTER — Ambulatory Visit: Payer: MEDICARE | Attending: Family Medicine | Primary: Family Medicine

## 2022-11-22 ENCOUNTER — Ambulatory Visit: Admit: 2022-11-22 | Discharge: 2022-11-22 | Payer: MEDICARE | Attending: Family Medicine | Primary: Family Medicine

## 2022-11-22 DIAGNOSIS — I10 Essential (primary) hypertension: Secondary | ICD-10-CM

## 2022-11-22 DIAGNOSIS — R413 Other amnesia: Secondary | ICD-10-CM

## 2022-11-22 MED ORDER — ASPIRIN 81 MG PO TBEC
81 MG | ORAL_TABLET | Freq: Every day | ORAL | 1 refills | Status: DC
Start: 2022-11-22 — End: 2023-05-01

## 2022-11-22 MED ORDER — ROSUVASTATIN CALCIUM 5 MG PO TABS
5 MG | ORAL_TABLET | Freq: Every day | ORAL | 1 refills | Status: DC
Start: 2022-11-22 — End: 2023-05-01

## 2022-11-22 MED ORDER — ROSUVASTATIN CALCIUM 5 MG PO TABS
5 | ORAL_TABLET | Freq: Every day | ORAL | 1 refills | Status: DC
Start: 2022-11-22 — End: 2022-11-22

## 2022-11-22 NOTE — Assessment & Plan Note (Addendum)
Monitored by specialist- no acute findings meriting change in the plan    Orders:    Iron and TIBC; Future    Ferritin; Future    Vitamin B12 & Folate; Future

## 2022-11-22 NOTE — Progress Notes (Signed)
 Family Medicine Follow-Up Progress Note   [facility]    Patient Patrick Welch  [birth year], 77 y.o., male  Encounter Date 11/22/22    ASSESSMENT AND PLAN:     Assessment & Plan      Assessment & Plan  Memory changes    The patient reports memory changes, including difficulty recalling recent events and recognizing familiar faces. He has not experienced any facial drooping, slurred speech, or other stroke-like symptoms. A neuropsychological evaluation was previously conducted, but no head imaging has been done recently. Comprehensive lab work will be ordered today, including blood sugar, electrolytes, thyroid function, and iron studies to rule out any metabolic causes. Head imaging will also be ordered to investigate the reported memory changes and recent episode of balance issues with left arm weakness. He is advised to continue his current medication regimen of amlodipine  10 mg in the evening and valsartan  80 mg in the morning.    Orders:    Iron and TIBC; Future    Ferritin; Future    Vitamin B12 & Folate; Future    MRI BRAIN WO CONTRAST; Future    Left arm weakness    The patient reports weakness and numbness in his left arm, making it difficult to perform tasks such as holding a cup. This symptom, along with his memory changes, raises concerns for a potential Transient Ischemic Attack (TIA) or mini-stroke. Head imaging will be ordered to investigate these symptoms further. He is advised to continue his current medication regimen of amlodipine  10 mg in the evening and valsartan  80 mg in the morning.  Full neurologic exam today was performed with normal findings.  He is having no acute red flag symptoms at this time.  We discussed preventative efforts such as starting a low dose of cholesterol medicine as well as a baby aspirin .  Patient is amenable to this.    Orders:    MRI BRAIN WO CONTRAST; Future    Essential hypertension   Borderline controlled, continue current medications, medication  adherence emphasized, and lifestyle modifications recommended         Type 2 diabetes mellitus with hyperglycemia, without long-term current use of insulin (HCC)   Unclear control, medication adherence emphasized, lifestyle modifications recommended, and continue with nutritionist    Orders:    Lipid Panel; Future    Microalbumin / Creatinine Urine Ratio; Future    Hemoglobin A1C; Future    CBC; Future    Comprehensive Metabolic Panel; Future    aspirin  81 MG EC tablet; Take 1 tablet by mouth daily    rosuvastatin  (CRESTOR ) 5 MG tablet; Take 1 tablet by mouth daily    Stage 3a chronic kidney disease (HCC)   Monitored by specialist- no acute findings meriting change in the plan    Orders:    Iron and TIBC; Future    Ferritin; Future    Vitamin B12 & Folate; Future    Depressive disorder   Well-controlled, lifestyle modifications recommended       Dizzy spells    As noted above.  Ordered labs and imaging given symptoms.    Orders:    MRI BRAIN WO CONTRAST; Future    Muscle cramps    As noted above, adding labs and imaging given symptoms.    Orders:    Magnesium; Future    Iron deficiency anemia, unspecified iron deficiency anemia type    Admits he is no longer taking iron supplements.  Will recheck given prior to treatment with  Slow Fe and memory changes.  Has seen hematology in the past.    Orders:    Iron and TIBC; Future    Ferritin; Future    Vitamin B12 & Folate; Future    Morbid obesity (HCC)    Encouraged continued nutritionist consults. The patient is asked to make an attempt to improve diet and exercise patterns to aid in medical management of this problem.          Adult BMI 35.0-35.9 kg/sq m    Encouraged continued nutritionist consults. The patient is asked to make an attempt to improve diet and exercise patterns to aid in medical management of this problem.              Orders Placed This Encounter    MRI BRAIN WO CONTRAST     Standing Status:   Future     Standing Expiration Date:   11/22/2023    Lipid  Panel     Standing Status:   Future     Standing Expiration Date:   11/22/2023    Microalbumin / Creatinine Urine Ratio     Standing Status:   Future     Standing Expiration Date:   11/22/2023    Hemoglobin A1C     Standing Status:   Future     Standing Expiration Date:   11/22/2023    CBC     Standing Status:   Future     Standing Expiration Date:   11/22/2023    Comprehensive Metabolic Panel     Standing Status:   Future     Standing Expiration Date:   11/22/2023    Magnesium     Standing Status:   Future     Standing Expiration Date:   11/22/2023    Iron and TIBC     Standing Status:   Future     Standing Expiration Date:   11/22/2023    Ferritin     Standing Status:   Future     Standing Expiration Date:   11/22/2023    Vitamin B12 & Folate     Standing Status:   Future     Standing Expiration Date:   11/22/2023    gabapentin (NEURONTIN) 300 MG capsule     Sig: Take 1 capsule by mouth 3 times daily. Max Daily Amount: 900 mg    DISCONTD: rosuvastatin  (CRESTOR ) 5 MG tablet     Sig: Take 1 tablet by mouth daily     Dispense:  90 tablet     Refill:  1    aspirin  81 MG EC tablet     Sig: Take 1 tablet by mouth daily     Dispense:  90 tablet     Refill:  1    rosuvastatin  (CRESTOR ) 5 MG tablet     Sig: Take 1 tablet by mouth daily     Dispense:  90 tablet     Refill:  1        Follow-up and Dispositions    Return in about 3 months (around 02/22/2023) for Hypertension, Diabetes, Cholesterol.          Return in about 3 months (around 02/22/2023) for Hypertension, Diabetes, Cholesterol.    Patient Instructions   Transient Ischemic Attack: Care Instructions  Overview     A transient ischemic attack (TIA) is when blood flow to a part of your brain is blocked for a short time. A TIA causes stroke symptoms that can last for at  least a few minutes. Stroke symptoms include sudden weakness or loss of movement in a part of your body, confusion, vision changes, trouble speaking, and trouble walking or balancing. But unlike a stroke, a TIA  doesn't cause lasting brain damage.  TIAs are often warning signs of a stroke. Some people who have a TIA may have a stroke in the future. If you have symptoms of a stroke, call for emergency help right away. Quick treatment can help limit damage to the brain and increase the chance of recovery.  You can take steps to help prevent a stroke. These steps include managing health problems that raise your risk, taking medicine that prevents blood clots, and having a heart-healthy lifestyle. This lifestyle includes being active, eating healthy foods, staying at a healthy weight, and not smoking.  Follow-up care is a key part of your treatment and safety. Be sure to make and go to all appointments, and call your doctor if you are having problems. It's also a good idea to know your test results and keep a list of the medicines you take.  How can you care for yourself at home?  Medicines    Be safe with medicines. Take your medicines exactly as prescribed. Call your doctor if you think you are having a problem with your medicine.     If you take a blood thinner, such as aspirin , be sure you get instructions about how to take your medicine safely. Blood thinners can cause serious bleeding problems.     Call your doctor if you are not able to take your medicines for any reason.     Do not take any over-the-counter medicines or herbal products without talking to your doctor first.     If you use hormonal birth control or hormone therapy for menopause, talk to your doctor. Ask if these are right for you. They may raise the risk of stroke in some people.   Heart-healthy lifestyle    Do not smoke. If you need help quitting, talk to your doctor about stop-smoking programs and medicines.     Be active. If your doctor recommends it, get more exercise. Walking is a good choice. Bit by bit, increase the amount you walk every day. Try for at least 30 minutes on most days of the week. You also may want to swim, bike, or do other  activities.     Eat heart-healthy foods. These include vegetables, fruits, nuts, beans, lean meat, fish, and whole grains. Limit sodium and sugar.     Stay at a healthy weight. Lose weight if you need to.     Limit alcohol to 2 drinks a day for men and 1 drink a day for women.   Staying healthy    Manage other health problems that raise your risk of stroke. These include atrial fibrillation, diabetes, high blood pressure, and high cholesterol.     If you think you may have a problem with alcohol or drug use, talk to your doctor.     Avoid infections such as COVID-19, colds, and the flu. Get the flu vaccine every year. Get a pneumococcal vaccine shot. If you have had one before, ask your doctor whether you need another dose. Stay up to date on your COVID-19 vaccines.   When should you call for help?   Call 911 anytime you think you may need emergency care. For example, call if:    You have symptoms of a stroke. These may include:  Sudden numbness,  tingling, weakness, or loss of movement in your face, arm, or leg, especially on only one side of your body.  Sudden vision changes.  Sudden trouble speaking.  Sudden confusion or trouble understanding simple statements.  Sudden problems with walking or balance.  A sudden, severe headache that is different from past headaches.  Call 911 even if these symptoms go away in a few minutes.     You feel like you are having another TIA.   Watch closely for changes in your health, and be sure to contact your doctor if you have any problems.    Subjective:     Chief Complaint   Patient presents with    Hypertension     Kayceon Oki is a 77 y.o. male who presents for a follow up on HTN.        History of Present Illness  Lasean Gorniak is a 77 y.o. male who presents for evaluation of multiple medical concerns.    He reports a general sense of well-being but has experienced some balance issues and mild stumbling upon standing from a seated position over the past few weeks. He also notes  weakness and numbness in his left arm, necessitating the use of both hands for tasks such as lifting a cup of tea. These symptoms first appeared approximately 3 to 4 weeks ago, with no identifiable triggering event. He reports no facial drooping or speech difficulties. He has not had any falls. He denies confusion episodes.     He underwent a neuropsychological evaluation due to memory changes.  However, he continues to experience forgetfulness, which he feels may have slightly worsened since his last visit.    His blood pressure readings have been satisfactory.  He is currently taking amlodipine  10 mg in the evening and valsartan  80 mg in the morning. He sends monthly BP readings through the portal.     He is not currently on any cholesterol-lowering medication and does not believe he requires it. He has experienced some muscle cramps but is open to trying cholesterol medication if recommended.    He has discontinued omeprazole in favor of famotidine , which he finds effective. He is currently not taking iron supplements.    He had a consultation with a nephrologist two weeks ago, who referred him to a nutritionist for weight loss and dietary advice. He has not been able to walk recently due to hot and humid weather conditions. He is considering joining a local men's group for socialization and physical activity. He has a Emergency planning/management officer at home.    FAMILY HISTORY  His father-in-law had TIA.        Reviewed PmHx, RxHx, FmHx, SocHx, AllgHx and updated and dated in the chart.      Current Outpatient Medications:     gabapentin (NEURONTIN) 300 MG capsule, Take 1 capsule by mouth 3 times daily. Max Daily Amount: 900 mg, Disp: , Rfl:     aspirin  81 MG EC tablet, Take 1 tablet by mouth daily, Disp: 90 tablet, Rfl: 1    rosuvastatin  (CRESTOR ) 5 MG tablet, Take 1 tablet by mouth daily, Disp: 90 tablet, Rfl: 1    amLODIPine  (NORVASC ) 10 MG tablet, Take 1 tablet by mouth daily, Disp: 90 tablet, Rfl: 1    mirtazapine  (REMERON ) 30 MG  tablet, Take 1 tablet by mouth nightly, Disp: 90 tablet, Rfl: 1    metFORMIN  (GLUCOPHAGE ) 500 MG tablet, Take 1 tablet by mouth daily (with breakfast), Disp: 90 tablet, Rfl: 1  valsartan  (DIOVAN ) 80 MG tablet, Take 1 tablet by mouth daily, Disp: 90 tablet, Rfl: 1    famotidine  (PEPCID ) 20 MG tablet, Take 1 tablet by mouth 2 times daily, Disp: 180 tablet, Rfl: 1    tamsulosin  (FLOMAX ) 0.4 MG capsule, Take 1 capsule by mouth daily, Disp: 90 capsule, Rfl: 1    cyanocobalamin 1000 MCG tablet, Take 1 tablet by mouth daily, Disp: , Rfl:     methocarbamol (ROBAXIN) 750 MG tablet, Take 1 tablet by mouth as needed (pain), Disp: , Rfl:     Melatonin 5 MG CAPS, Take 5 capsules by mouth nightly, Disp: , Rfl:     acetaminophen (TYLENOL) 650 MG extended release tablet, Take 1 tablet by mouth every 8 hours as needed, Disp: , Rfl:      Patient Active Problem List   Diagnosis Code    Stage 3a chronic kidney disease (HCC) N18.31    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    Benign prostatic hyperplasia N40.0    Depressive disorder F32.A    Essential hypertension I10    Gastro-esophageal reflux disease without esophagitis K21.9    Hyperglycemia due to type 2 diabetes mellitus (HCC) E11.65          Review of Systems  Review of Systems - negative except as listed above in the HPI    Objective:     Vitals:    11/22/22 1257 11/22/22 1542   BP: (!) 142/70 123/71   Site: Left Upper Arm Right Upper Arm   Position: Sitting    Cuff Size: Medium Adult    Pulse: 72    Resp: 15    Temp: 97.3 F (36.3 C)    TempSrc: Temporal    SpO2: 94%    Weight: 114.4 kg (252 lb 4.8 oz)    Height: 1.803 m (5\' 11" )         Vitals and Nurse Documentation reviewed.     Physical Exam         Physical Exam  Constitutional:       Appearance: Normal appearance. He is obese.   HENT:      Head: Normocephalic and atraumatic.      Nose: Nose normal.      Mouth/Throat:      Mouth: Mucous membranes are moist.   Eyes:      Extraocular Movements: Extraocular movements  intact.      Pupils: Pupils are equal, round, and reactive to light.   Neck:      Vascular: No carotid bruit.   Cardiovascular:      Rate and Rhythm: Normal rate and regular rhythm.   Pulmonary:      Breath sounds: Normal breath sounds. No wheezing, rhonchi or rales.   Musculoskeletal:      Left lower leg: No edema.   Skin:     General: Skin is warm.   Neurological:      Mental Status: He is alert and oriented to person, place, and time. Mental status is at baseline.      Cranial Nerves: Cranial nerves 2-12 are intact.      Sensory: Sensation is intact.      Motor: Motor function is intact.      Gait: Gait is intact.      Deep Tendon Reflexes: Reflexes are normal and symmetric.   Psychiatric:         Mood and Affect: Mood normal.         Behavior: Behavior normal.  Results  Laboratory Studies  Hemoglobin was normal.    I have discussed the diagnosis with the patient and the intended plan as seen in the above orders.  The patient understands and agrees with the plan. The patient has received an after-visit summary and questions were answered concerning future plans.     Medication Side Effects and Warnings were discussed with patient  Patient Labs were reviewed and or requested:  Patient Past Records were reviewed and or requested    Please note that this dictation was completed with Dragon, the computer voice recognition software.  Quite often unanticipated grammatical, syntax, homophones, and other interpretive errors are inadvertently transcribed by the computer software.  Please disregard these errors.  Please excuse any errors that have escaped final proofreading.  Thank you.     The patient (or guardian, if applicable) and other individuals in attendance with the patient were advised that Artificial Intelligence will be utilized during this visit to record and process the conversation to generate a clinical note. The patient (or guardian, if applicable) and other individuals in attendance at the  appointment consented to the use of AI, including the recording.      Spent 30 minutes in education, discussion of disease process, medication profile and possible side effects. coordinating care with staff     Jill Motes, APRN - CNP  [facility]  [phone]

## 2022-11-22 NOTE — Assessment & Plan Note (Addendum)
Well-controlled, lifestyle modifications recommended

## 2022-11-22 NOTE — Assessment & Plan Note (Addendum)
Borderline controlled, continue current medications, medication adherence emphasized, and lifestyle modifications recommended

## 2022-11-22 NOTE — Assessment & Plan Note (Addendum)
Unclear control, medication adherence emphasized, lifestyle modifications recommended, and continue with nutritionist    Orders:    Lipid Panel; Future    Microalbumin / Creatinine Urine Ratio; Future    Hemoglobin A1C; Future    CBC; Future    Comprehensive Metabolic Panel; Future    aspirin 81 MG EC tablet; Take 1 tablet by mouth daily    rosuvastatin (CRESTOR) 5 MG tablet; Take 1 tablet by mouth daily

## 2022-11-22 NOTE — Progress Notes (Signed)
Chief Complaint   Patient presents with    Hypertension     Patrick Welch is a 77 y.o. male who presents for a follow up on HTN.      "Have you been to the ER, urgent care clinic since your last visit?  Hospitalized since your last visit?"    NO    "Have you seen or consulted any other health care providers outside of [facility] since your last visit?"    NO            Click Here for Release of Records Request

## 2022-11-22 NOTE — Patient Instructions (Signed)
Transient Ischemic Attack: Care Instructions  Overview     A transient ischemic attack (TIA) is when blood flow to a part of your brain is blocked for a short time. A TIA causes stroke symptoms that can last for at least a few minutes. Stroke symptoms include sudden weakness or loss of movement in a part of your body, confusion, vision changes, trouble speaking, and trouble walking or balancing. But unlike a stroke, a TIA doesn't cause lasting brain damage.  TIAs are often warning signs of a stroke. Some people who have a TIA may have a stroke in the future. If you have symptoms of a stroke, call for emergency help right away. Quick treatment can help limit damage to the brain and increase the chance of recovery.  You can take steps to help prevent a stroke. These steps include managing health problems that raise your risk, taking medicine that prevents blood clots, and having a heart-healthy lifestyle. This lifestyle includes being active, eating healthy foods, staying at a healthy weight, and not smoking.  Follow-up care is a key part of your treatment and safety. Be sure to make and go to all appointments, and call your doctor if you are having problems. It's also a good idea to know your test results and keep a list of the medicines you take.  How can you care for yourself at home?  Medicines    Be safe with medicines. Take your medicines exactly as prescribed. Call your doctor if you think you are having a problem with your medicine.     If you take a blood thinner, such as aspirin, be sure you get instructions about how to take your medicine safely. Blood thinners can cause serious bleeding problems.     Call your doctor if you are not able to take your medicines for any reason.     Do not take any over-the-counter medicines or herbal products without talking to your doctor first.     If you use hormonal birth control or hormone therapy for menopause, talk to your doctor. Ask if these are right for you. They  may raise the risk of stroke in some people.   Heart-healthy lifestyle    Do not smoke. If you need help quitting, talk to your doctor about stop-smoking programs and medicines.     Be active. If your doctor recommends it, get more exercise. Walking is a good choice. Bit by bit, increase the amount you walk every day. Try for at least 30 minutes on most days of the week. You also may want to swim, bike, or do other activities.     Eat heart-healthy foods. These include vegetables, fruits, nuts, beans, lean meat, fish, and whole grains. Limit sodium and sugar.     Stay at a healthy weight. Lose weight if you need to.     Limit alcohol to 2 drinks a day for men and 1 drink a day for women.   Staying healthy    Manage other health problems that raise your risk of stroke. These include atrial fibrillation, diabetes, high blood pressure, and high cholesterol.     If you think you may have a problem with alcohol or drug use, talk to your doctor.     Avoid infections such as COVID-19, colds, and the flu. Get the flu vaccine every year. Get a pneumococcal vaccine shot. If you have had one before, ask your doctor whether you need another dose. Stay up to date on your  COVID-19 vaccines.   When should you call for help?   Call 911 anytime you think you may need emergency care. For example, call if:    You have symptoms of a stroke. These may include:  Sudden numbness, tingling, weakness, or loss of movement in your face, arm, or leg, especially on only one side of your body.  Sudden vision changes.  Sudden trouble speaking.  Sudden confusion or trouble understanding simple statements.  Sudden problems with walking or balance.  A sudden, severe headache that is different from past headaches.  Call 911 even if these symptoms go away in a few minutes.     You feel like you are having another TIA.   Watch closely for changes in your health, and be sure to contact your doctor if you have any problems.

## 2022-11-24 LAB — IRON AND TIBC
Iron % Saturation: 41 % (ref 15–55)
Iron: 137 ug/dL (ref 38–169)
TIBC: 332 ug/dL (ref 250–450)
UIBC: 195 ug/dL (ref 111–343)

## 2022-11-24 LAB — FERRITIN: Ferritin: 282 ng/mL (ref 30–400)

## 2022-11-24 LAB — CBC WITH AUTO DIFFERENTIAL
Basophils %: 1 %
Basophils Absolute: 0 10*3/uL (ref 0.0–0.2)
Eosinophils %: 1 %
Eosinophils Absolute: 0 10*3/uL (ref 0.0–0.4)
Hematocrit: 45.3 % (ref 37.5–51.0)
Hemoglobin: 15.1 g/dL (ref 13.0–17.7)
Immature Grans (Abs): 0 10*3/uL (ref 0.0–0.1)
Immature Granulocytes %: 0 %
Lymphocytes %: 30 %
Lymphocytes Absolute: 1.7 10*3/uL (ref 0.7–3.1)
MCH: 33.1 pg — ABNORMAL HIGH (ref 26.6–33.0)
MCHC: 33.3 g/dL (ref 31.5–35.7)
MCV: 99 fL — ABNORMAL HIGH (ref 79–97)
Monocytes %: 8 %
Monocytes Absolute: 0.5 10*3/uL (ref 0.1–0.9)
Neutrophils %: 60 %
Neutrophils Absolute: 3.4 10*3/uL (ref 1.4–7.0)
Platelets: 195 10*3/uL (ref 150–450)
RBC: 4.56 x10E6/uL (ref 4.14–5.80)
RDW: 12.6 % (ref 11.6–15.4)
WBC: 5.6 10*3/uL (ref 3.4–10.8)

## 2022-11-24 LAB — LIPID PANEL
Cholesterol, Total: 180 mg/dL (ref 100–199)
HDL: 34 mg/dL — ABNORMAL LOW (ref >39)
LDL Cholesterol: 118 mg/dL — ABNORMAL HIGH (ref 0–99)
Triglycerides: 157 mg/dL — ABNORMAL HIGH (ref 0–149)
VLDL Cholesterol Calculated: 28 mg/dL (ref 5–40)

## 2022-11-24 LAB — COMPREHENSIVE METABOLIC PANEL
ALT: 42 IU/L (ref 0–44)
AST: 32 IU/L (ref 0–40)
Albumin: 4.3 g/dL (ref 3.8–4.8)
Alkaline Phosphatase: 103 IU/L (ref 44–121)
BUN/Creatinine Ratio: 10 (ref 10–24)
BUN: 13 mg/dL (ref 8–27)
CO2: 23 mmol/L (ref 20–29)
Calcium: 9.8 mg/dL (ref 8.6–10.2)
Chloride: 101 mmol/L (ref 96–106)
Creatinine: 1.33 mg/dL — ABNORMAL HIGH (ref 0.76–1.27)
Est, Glom Filt Rate: 56 mL/min/{1.73_m2} — ABNORMAL LOW (ref >59)
Globulin, Total: 2.2 g/dL (ref 1.5–4.5)
Glucose: 126 mg/dL — ABNORMAL HIGH (ref 70–99)
Potassium: 5.1 mmol/L (ref 3.5–5.2)
Sodium: 138 mmol/L (ref 134–144)
Total Bilirubin: 0.7 mg/dL (ref 0.0–1.2)
Total Protein: 6.5 g/dL (ref 6.0–8.5)

## 2022-11-24 LAB — MAGNESIUM: Magnesium: 2 mg/dL (ref 1.6–2.3)

## 2022-11-24 LAB — VITAMIN B12 & FOLATE
Folate: 16 ng/mL (ref >3.0)
Vitamin B-12: 1735 pg/mL — ABNORMAL HIGH (ref 232–1245)

## 2022-11-24 LAB — HEMOGLOBIN A1C: Hemoglobin A1C: 6.3 % — ABNORMAL HIGH (ref 4.8–5.6)

## 2022-11-24 NOTE — Other (Signed)
Sugar and cholesterol have gone up a bit. Kidneys are steady. Blood counts are good. Iron levels are good. Thank you, me

## 2022-11-25 LAB — LITHOLINK CKD PROGRAM

## 2022-11-25 LAB — CARDIOVASCULAR REPORT

## 2022-11-25 LAB — ALBUMIN/CREATININE RATIO, URINE
Albumin, U: 7.1 ug/mL
Albumin/Creatinine Ratio: 4 mg/g{creat} (ref 0–29)
Creatinine, Ur: 179.1 mg/dL

## 2022-11-25 LAB — DIABETES PATIENT EDUCATION

## 2022-12-02 ENCOUNTER — Inpatient Hospital Stay: Admit: 2022-12-02 | Payer: MEDICARE | Primary: Family Medicine

## 2022-12-02 DIAGNOSIS — R42 Dizziness and giddiness: Secondary | ICD-10-CM

## 2022-12-02 NOTE — Result Encounter Note (Signed)
 Mr. Valente, thanks for getting this done.      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      Infarcts are areas that have had reduced blood flow to the brain, likely due to a prior stroke, or even a few prior strokes.  Likely this is old, but we have no way of knowing how old it is.     A stroke happens when a blood clot blocks flow of blood and oxygen to the brain.  These form when the arteries have narrowed over time or been blocked by fatty deposits or plaques.  This is why we try to use cholesterol medication like a statin to help keep those plaques quiet.  It is also why we try to keep your blood pressure low to minimize the amount of pressure on these vessels.      Given that these appear old, I am not sure that it would explain the symptoms you are currently having, but it does make me feel like it could more likely have been a mini stroke or TIA.      Another preventive option we could do is use a blood thinner.  Because you do not have a history of atrial fibrillation or an irregular heart rhythm, I would likely pick Plavix for you.  These blood thinners help to thin the blood, so that there is less force when they are going through narrowed arteries and vessels, making it less likely that a plaque would break off and form a clot.  As such, the side effects are typically easy bleeding and bruising.  If you have any injury or a fall, we would need you to get checked out given the increased risk of bleeding.      For now until we talk again, I would keep taking your aspirin , as that is giving you some protection.  I know we are going into a holiday weekend, but I did not want you to see these results and be wondering what they mean.  I am happy to try to get together next week to discuss the options more fully and you can try to make an appointment for an acute visit in the portal if you would like.      So, do not overly stress, but I do  think we just will need to be a little bit more aggressive about your stroke prevention.  If you have a neurologist that you are currently seeing I'm happy to send them these results as well, just let me know.  Thank you very much, [name], APRN - CNP

## 2023-01-03 ENCOUNTER — Encounter

## 2023-01-03 MED ORDER — FAMOTIDINE 20 MG PO TABS
20 | ORAL_TABLET | Freq: Two times a day (BID) | ORAL | 1 refills | Status: DC
Start: 2023-01-03 — End: 2023-04-06

## 2023-01-03 NOTE — Telephone Encounter (Signed)
[facility] faxed request     FAMOTIDINE TABS 20 MG    QTY 180    Take 1 tablet twice a day

## 2023-01-16 MED ORDER — TAMSULOSIN HCL 0.4 MG PO CAPS
0.4 MG | ORAL_CAPSULE | Freq: Every day | ORAL | 1 refills | Status: DC
Start: 2023-01-16 — End: 2023-08-23

## 2023-01-16 NOTE — Telephone Encounter (Signed)
[facility] faxed request    TAMSULOSIN HCL CAPS 0.4 MG    Qty 90    TAKE 1 CAPSULE DAILY

## 2023-01-30 ENCOUNTER — Encounter

## 2023-01-30 MED ORDER — METFORMIN HCL 500 MG PO TABS
500 MG | ORAL_TABLET | Freq: Every day | ORAL | 1 refills | Status: AC
Start: 2023-01-30 — End: 2023-02-22

## 2023-01-30 NOTE — Telephone Encounter (Signed)
Refill for Metformin has been received from [facility].

## 2023-01-31 ENCOUNTER — Encounter

## 2023-01-31 MED ORDER — AMLODIPINE BESYLATE 10 MG PO TABS
10 | ORAL_TABLET | Freq: Every day | ORAL | 1 refills | Status: DC
Start: 2023-01-31 — End: 2023-10-23

## 2023-02-08 MED ORDER — VALSARTAN 80 MG PO TABS
80 MG | ORAL_TABLET | Freq: Every day | ORAL | 1 refills | Status: AC
Start: 2023-02-08 — End: 2024-02-08

## 2023-02-08 NOTE — Telephone Encounter (Signed)
Faxed refill request:     Valsartan 80mg  tabs   Qty: 90   Refills: 3    [facility]

## 2023-02-22 ENCOUNTER — Ambulatory Visit
Admit: 2023-02-22 | Discharge: 2023-02-22 | Payer: Medicare (Managed Care) | Attending: Family Medicine | Admitting: Family Medicine | Primary: Family Medicine

## 2023-02-22 VITALS — BP 106/62 | HR 78 | Temp 97.80000°F | Resp 16 | Ht 71.0 in | Wt 257.3 lb

## 2023-02-22 DIAGNOSIS — E1165 Type 2 diabetes mellitus with hyperglycemia: Principal | ICD-10-CM

## 2023-02-22 MED ORDER — METFORMIN HCL 500 MG PO TABS
500 MG | ORAL_TABLET | Freq: Two times a day (BID) | ORAL | 1 refills | Status: DC
Start: 2023-02-22 — End: 2023-08-17

## 2023-02-22 NOTE — Patient Instructions (Addendum)
 Today we discussed:  Constipation - Try adding metamucil daily for fiber. If this causes too much bloating then try citrucel, another kind of fiber.  Congestion - I am requesting your note from ENT. STOP Afrin, as this is creating rebound symptoms for you.

## 2023-02-22 NOTE — Progress Notes (Signed)
 Family Medicine Follow-Up Progress Note   [facility]    Patient Patrick Welch  March 29, 1946, 77 y.o., male  Encounter Date 02/22/23    ASSESSMENT AND PLAN:     Assessment & Plan      Assessment & Plan  Controlled type 2 diabetes mellitus with hyperglycemia, without long-term current use of insulin (HCC)    Unclear control; provided with lab order. He was advised to monitor his carbohydrate intake and use a calorie tracking app like My[employer]itnessPal. His metformin  dosage will be increased to twice daily to help with weight management and constipation.     Orders:    Basic Metabolic Panel; Future    Hemoglobin A1C; Future    metFORMIN  (GLUCOPHAGE ) 500 MG tablet; Take 1 tablet by mouth 2 times daily (with meals)    Morbid (severe) obesity due to excess calories    His weight has increased from 221 pounds in August 2023 to 257 pounds currently. He reports eating a balanced diet but has reduced physical activity due to hip pain and neuropathy in his feet. He was advised to monitor his carbohydrate intake and use a calorie tracking app like My[employer]itness[employer]. His metformin  dosage will be increased to twice daily to help with weight management and constipation.          Essential hypertension    Chronic, at goal (stable), continue current treatment plan, medication adherence emphasized, and lifestyle modifications recommended         Constipation, unspecified constipation type    He was advised to try Metamucil to increase his fiber intake. If this causes excessive bloating, Citrucel can be used as an alternative. Increasing the metformin  dosage may also help with constipation.       Right hip pain    The pain appears to be arthritic in nature. He was advised to discontinue ibuprofen due to its potential impact on kidney function. Instead, he can take Tylenol Arthritis daily as needed. Topical treatments such as Biofreeze or lidocaine patches are also recommended. A set of home exercises will be provided. If the  exercises do not alleviate the pain after a few weeks, he should inform the office so a steroid injection can be considered.       Fatigue, unspecified type    Adding testosterone level, encouraged physical activity and carb control    Orders:    Testosterone, free, total; Future    NSAID long-term use    He was advised to discontinue ibuprofen due to its potential impact on kidney function       Congestion of throat    He was advised to discontinue Afrin and use Flonase (fluticasone) nasal spray instead. He should expect some rebound symptoms initially but should continue with Flonase for long-term management. States he saw ENT; will request note.         Stage 3a chronic kidney disease (HCC)    Monitored by specialist- no acute findings meriting change in the plan. Strongly advised cessation of NSAIDs.        Adult BMI 35.0-35.9 kg/sq m    Discussed diet, activity, and increased metformin            Orders Placed This Encounter    Influenza, FLUAD Trivalent, (age 41 y+), IM, Preservative Free, 0.5mL    Basic Metabolic Panel     Standing Status:   Future     Standing Expiration Date:   02/22/2024    Hemoglobin A1C     Standing Status:  Future     Standing Expiration Date:   02/22/2024    Testosterone, free, total     Standing Status:   Future     Standing Expiration Date:   02/22/2024    gabapentin (NEURONTIN) 400 MG capsule     Sig: Take 1 capsule by mouth 3 times daily.    metFORMIN  (GLUCOPHAGE ) 500 MG tablet     Sig: Take 1 tablet by mouth 2 times daily (with meals)     Dispense:  180 tablet     Refill:  1        Follow-up and Dispositions    Return in about 3 months (around 05/25/2023) for Diabetes, Hypertension, Cholesterol.          Return in about 3 months (around 05/25/2023) for Diabetes, Hypertension, Cholesterol.    Patient Instructions   Today we discussed:  Constipation - Try adding metamucil daily for fiber. If this causes too much bloating then try citrucel, another kind of fiber.  Congestion - I am  requesting your note from ENT. STOP Afrin, as this is creating rebound symptoms for you.  Hip - I am printing you exercises I want you try try consistently. If no improvement in a few weeks, please let me know.  Weight - I would try tracking for a week just to get a sense of whether there is any low hanging fruit we can cut out. Be mindful of carbs. We will increase your metformin  to help with weight loss and constipation.  Fatigue - we are adding a testosterone check. Please get these labs done before 10 am in the morning.   Ibuprofen - STOP this! Kidneys don't like it! Try tylenol arthritis daily as needed.     Thank you and great to see you today!   Please reach out on MyChart with any questions.   Jill Motes, APRN - CNP      Subjective:     Chief Complaint   Patient presents with    Diabetes     Patrick Welch is a 77 y.o. male who presents for a follow up on T2DM.        History of Present Illness  Tyvon Eggenberger is a 77 y.o. male who presents for follow up of DMT2, HTN. He also has multiple medical concerns.     He is seeking advice on weight loss, despite maintaining a healthy diet. His weight has increased from 221 pounds in 11/2021 to 257 pounds currently. He monitors his weight weekly and has noticed a fluctuation of about 3 pounds. He reports difficulty in reaching certain areas due to his weight gain. His physical activity has decreased due to hip pain and neuropathy in his feet, which he has consulted a podiatrist for. He has been under the care of a nephrologist since 11/2021 and has been advised by a nutritionist to maintain his current diet. His daily diet includes two slices of whole wheat bread, occasional white bread rolls, small potatoes, and minimal rice. He avoids sodas and cheese, and has recently switched from oatmeal to wheat flakes for breakfast.    He also reports memory issues, having seen a neurologist and undergone a cognitive memory test.    He experiences hip pain, particularly in the  morning, and takes ibuprofen 400 mg twice daily for relief. He also uses over-the-counter lidocaine cream for his lower back and hip.    He reports constipation despite a healthy diet and adequate water intake. He also experiences  dry mouth, for which he uses sugar-free ice breakers. He has noticed that sorbitol helps with his bowel movements but causes gut pain. He is considering MiraLAX for his constipation.    He also experiences hot flashes, fatigue, and low libido.    He has sinus issues, postnasal drip, and dry mouth, for which he has seen an ENT specialist. He uses saline rinse twice daily and over-the-counter Afrin for congestion. He also uses Flonase nasal spray.        Reviewed PmHx, RxHx, FmHx, SocHx, AllgHx and updated and dated in the chart.      Current Outpatient Medications:     gabapentin (NEURONTIN) 400 MG capsule, Take 1 capsule by mouth 3 times daily., Disp: , Rfl:     metFORMIN  (GLUCOPHAGE ) 500 MG tablet, Take 1 tablet by mouth 2 times daily (with meals), Disp: 180 tablet, Rfl: 1    valsartan  (DIOVAN ) 80 MG tablet, Take 1 tablet by mouth daily, Disp: 90 tablet, Rfl: 1    amLODIPine  (NORVASC ) 10 MG tablet, Take 1 tablet by mouth daily, Disp: 90 tablet, Rfl: 1    tamsulosin  (FLOMAX ) 0.4 MG capsule, Take 1 capsule by mouth daily, Disp: 90 capsule, Rfl: 1    famotidine  (PEPCID ) 20 MG tablet, Take 1 tablet by mouth 2 times daily, Disp: 180 tablet, Rfl: 1    aspirin  81 MG EC tablet, Take 1 tablet by mouth daily, Disp: 90 tablet, Rfl: 1    rosuvastatin  (CRESTOR ) 5 MG tablet, Take 1 tablet by mouth daily, Disp: 90 tablet, Rfl: 1    mirtazapine  (REMERON ) 30 MG tablet, Take 1 tablet by mouth nightly, Disp: 90 tablet, Rfl: 1    methocarbamol (ROBAXIN) 750 MG tablet, Take 1 tablet by mouth as needed (pain), Disp: , Rfl:     Melatonin 5 MG CAPS, Take 5 capsules by mouth nightly, Disp: , Rfl:     acetaminophen (TYLENOL) 650 MG extended release tablet, Take 1 tablet by mouth every 8 hours as needed (Patient  not taking: Reported on 02/22/2023), Disp: , Rfl:      Patient Active Problem List   Diagnosis Code    Stage 3a chronic kidney disease (HCC) N18.31    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    Benign prostatic hyperplasia N40.0    Depressive disorder F32.A    Essential hypertension I10    Gastro-esophageal reflux disease without esophagitis K21.9    Hyperglycemia due to type 2 diabetes mellitus (HCC) E11.65          Review of Systems  Review of Systems - negative except as listed above in the HPI    Objective:     Vitals:    02/22/23 1257   BP: 106/62   Site: Left Upper Arm   Position: Sitting   Cuff Size: Medium Adult   Pulse: 78   Resp: 16   Temp: 97.8 F (36.6 C)   TempSrc: Temporal   SpO2: 98%   Weight: 116.7 kg (257 lb 4.8 oz)   Height: 1.803 m (5\' 11" )        Vitals and Nurse Documentation reviewed.     Physical Exam       General: Well developed, in no acute distress. Morbidly obese.   HEENT: No jaundice. Normocephalic and atraumatic.   Neck: Supple, no stiffness  Heart: Normal rate; no murmurs  Respiratory: Clear bilaterally x 4, no wheezing or rales  Extremities:  No edema, normal cap refill, no cyanosis.  Musculoskeletal: No clubbing,  no deformities  Neuro: A&Ox3, speech clear, gait stable, cooperative, no focal neurologic deficits  Psychiatric: Mood and behavior normal  Skin: Skin color is normal. No rashes or lesions. Non diaphoretic, moist.    Results      I have discussed the diagnosis with the patient and the intended plan as seen in the above orders.  The patient understands and agrees with the plan. The patient has received an after-visit summary and questions were answered concerning future plans.     Medication Side Effects and Warnings were discussed with patient  Patient Labs were reviewed and or requested:  Patient Past Records were reviewed and or requested    Please note that this dictation was completed with Dragon, the computer voice recognition software.  Quite often unanticipated  grammatical, syntax, homophones, and other interpretive errors are inadvertently transcribed by the computer software.  Please disregard these errors.  Please excuse any errors that have escaped final proofreading.  Thank you.     The patient (or guardian, if applicable) and other individuals in attendance with the patient were advised that Artificial Intelligence will be utilized during this visit to record and process the conversation to generate a clinical note. The patient (or guardian, if applicable) and other individuals in attendance at the appointment consented to the use of AI, including the recording.      Spent 30 minutes in education, discussion of disease process, medication profile and possible side effects. coordinating care with staff    Jill Motes, APRN - CNP  [facility]  [phone]

## 2023-02-22 NOTE — Assessment & Plan Note (Addendum)
 Chronic, at goal (stable), continue current treatment plan, medication adherence emphasized, and lifestyle modifications recommended

## 2023-02-22 NOTE — Assessment & Plan Note (Addendum)
 Monitored by specialist- no acute findings meriting change in the plan. Strongly advised cessation of NSAIDs.

## 2023-02-22 NOTE — Progress Notes (Signed)
 Chief Complaint   Patient presents with    Diabetes     Patrick Welch is a 77 y.o. male who presents for a follow up on T2DM.      "Have you been to the ER, urgent care clinic since your last visit?  Hospitalized since your last visit?"    NO    "Have you s

## 2023-02-24 LAB — BASIC METABOLIC PANEL
BUN/Creatinine Ratio: 11 (ref 10–24)
BUN: 15 mg/dL (ref 8–27)
CO2: 24 mmol/L (ref 20–29)
Calcium: 9.4 mg/dL (ref 8.6–10.2)
Chloride: 105 mmol/L (ref 96–106)
Creatinine: 1.37 mg/dL — ABNORMAL HIGH (ref 0.76–1.27)
Est, Glom Filt Rate: 54 mL/min/{1.73_m2} — ABNORMAL LOW (ref >59)
Glucose: 100 mg/dL — ABNORMAL HIGH (ref 70–99)
Potassium: 4.7 mmol/L (ref 3.5–5.2)
Sodium: 142 mmol/L (ref 134–144)

## 2023-02-24 LAB — LITHOLINK CKD PROGRAM

## 2023-02-24 LAB — DIABETES PATIENT EDUCATION

## 2023-02-24 LAB — HEMOGLOBIN A1C: Hemoglobin A1C: 6.5 % — ABNORMAL HIGH (ref 4.8–5.6)

## 2023-02-24 NOTE — Other (Signed)
 I don't have all your results back yet but wanted to reach out on the sugar and kidneys. Kidneys are stable. Sugar did go back up. We could consider trying one of the new diabetes/weight loss meds like the injectable (ozempic) or the pill form (rybelsus).

## 2023-02-26 LAB — TESTOSTERONE, FREE, TOTAL
Testosterone, Free: 4.6 pg/mL — ABNORMAL LOW (ref 6.6–18.1)
Testosterone: 485 ng/dL (ref 264–916)

## 2023-02-28 NOTE — Other (Signed)
 [name], your overall testosterone level is normal.  The free testosterone is a little bit low.  This can happen when you are not using the testosterone as efficiently such as when we've gained some extra weight. But the testosterone is there! Not much

## 2023-04-06 ENCOUNTER — Encounter: Admit: 2023-04-06 | Admitting: Family Medicine

## 2023-04-06 DIAGNOSIS — K219 Gastro-esophageal reflux disease without esophagitis: Secondary | ICD-10-CM

## 2023-04-07 MED ORDER — FAMOTIDINE 20 MG PO TABS
20 | ORAL_TABLET | Freq: Two times a day (BID) | ORAL | 1 refills | Status: DC
Start: 2023-04-07 — End: 2023-10-11

## 2023-04-07 NOTE — Telephone Encounter (Signed)
 Don't forget to attach the pharmacy to the med when forwarding scripts! Thank you :-)

## 2023-05-01 ENCOUNTER — Encounter

## 2023-05-01 MED ORDER — ASPIRIN 81 MG PO TBEC
81 | ORAL_TABLET | Freq: Every day | ORAL | 1 refills | Status: DC
Start: 2023-05-01 — End: 2023-10-25

## 2023-05-01 MED ORDER — ROSUVASTATIN CALCIUM 5 MG PO TABS
5 | ORAL_TABLET | Freq: Every day | ORAL | 1 refills | Status: DC
Start: 2023-05-01 — End: 2023-10-30

## 2023-05-04 ENCOUNTER — Ambulatory Visit: Admit: 2023-05-04 | Discharge: 2023-05-04 | Payer: MEDICARE | Attending: Family Medicine | Primary: Family Medicine

## 2023-05-04 VITALS — BP 120/71 | HR 95 | Temp 97.70000°F | Resp 16 | Ht 71.0 in | Wt 254.8 lb

## 2023-05-04 DIAGNOSIS — Z0000 Encounter for general adult medical examination without abnormal findings: Secondary | ICD-10-CM

## 2023-05-04 MED ORDER — PREVNAR 20 0.5 ML IM SUSY
0.5 | Freq: Once | INTRAMUSCULAR | 0 refills | Status: AC
Start: 2023-05-04 — End: 2023-05-04

## 2023-05-04 MED ORDER — SERTRALINE HCL 25 MG PO TABS
25 MG | ORAL_TABLET | Freq: Every day | ORAL | 0 refills | Status: AC
Start: 2023-05-04 — End: 2023-05-25

## 2023-05-04 NOTE — Patient Instructions (Addendum)
 Advised the patient to call the Behavioral Health or Member Services number(s) on the back of their insurance card, for a comprehensive list of in-network providers.  Provided information for the 988 Suicide & Crisis Lifeline (formerly known as the [employer]) that offers 24/7 call, text and chat access to trained crisis counselors who can help people experiencing suicidal, substance use, and/or mental health crisis, or any other kind of emotional distress.     Provided the following list of agencies that provide counseling and psychiatric care.  Advised that the patient call to inquire about an appointment, and ensure that there is a provider available that accepts the patient's insurance plan.       [facility]   Website: [email or web address]  Phone: P#[phone]   Services: Counseling and Psychiatry.  In person and virtual visits offered.    Insurance: Accepts most Ecologist as well as Art gallery manager.      [facility]   Website: [email or web address]  Phone: P#[phone]   Services: Counseling and psychiatry.  Virtual visits for counseling, and in person visits for psychiatric medication management.    Insurance: Accepts most [employer] including [address], [address], [address], [address], [address], [address], [employer], [place], [place], [place], [employer], [place].       [facility]   Website: [email or web address]  Phone: P#[phone]  Services: Counseling and Psychiatry. In person and virtual visits offered.    Insurance: Web designer, Hydrologist), [employer] (Anthem BCBS), [employer] (Cigna), [place], [employer] [facility]), [employer]  MediNcrease Health Plans (MHP), MultiPlan (PHCS), Optima Health (Sentara), Optum Materials engineer), Futures trader, Provider Network of [place] (PNOA), Publishing copy, Conservation officer, nature [facility]).      [facility]   Website: [email or web address]  Phone: [phone]  [phone]   Services: Counseling and Psychiatry.  In person and virtual visits offered.    Insurance: Web designer, KB Home	[place], [address], Anthem HealthKeepers, [employer], [address], [place] and [place], [employer] Health Plans, [address], [address] [employer], [place], Optum, [address], and [employer].  Offer sliding scale fees for patients with low income.       [facility]   Website: [email or web address]  Phone:  Offices in [place] (P#[phone]), Presenter, broadcasting (P#[phone]), and [place] [phone])   Services: Counseling and Psychiatry.  In person and virtual visits offered.  An in person visit is required for initial intake appointments.  Do not accept patients over the age of 22.    Insurance: Most Librarian, academic.  Accept Medicare and Medicaid.       [facility]   Website: [email or web address]  Phone: P#[phone]  Services: Counseling and Psychiatry.    Insurance: Accepts a IT consultant, including [employer].  Advised that the patient visit [email or web address] or call P#[phone] for care navigation.       [facility]   Website: [email or web address]  Phone: P#[phone]   Services:  Counseling and Psychiatry.  In person and virtual.    Insurance: Community education officer, Anthem/BCBS, KB Home	[place], [place], [place], [place], [place], [address], [address] and [address] Complete, Care of [place] ([place]), [employer], [employer], MVP, [employer], Optum/GEHA/UMR, [employer]** Participating Providers, [employer]ant, Value Options, [employer] (CSBs)   Website: [email or web address]   Services: Support to individuals with mental health challenges, substance use disorders, and intellectual disability (ID) and/or developmental disability (DD).   Each locality  provides information for same-day access, emergency services, and services for adults and children.   Insurance/Payment: Work on  a sliding scale fee system.  You will be required to complete a financial screening, before receiving services.       [employer] Information  Patient also expressed interest in [employer] [facility].  Explained that they can apply online for this program, and/or call the coordinator, Ms. Haynes Bast, at [phone], to learn more, and to confirm that transportation is provided for their Friendship Cafe location of choice <[email or web address]         Learning About Mindfulness for Stress  What are mindfulness and stress?     Stress is your body's response to a hard situation. Your body can have a physical, emotional, or mental response. A lot of things can cause stress. You may feel stress when you go on a job interview, take a test, or run a race. This kind of short-term stress is normal and even useful. It can help you if you need to work hard or react quickly.  Stress also can last a long time. Long-term stress is caused by stressful situations or events. Examples of long-term stress include long-term health problems, ongoing problems at work, and conflicts in your family. Long-term stress can harm your health.  Mindfulness is a focus only on things happening in the present moment. It's a process of purposefully paying attention to and being aware of your surroundings, your emotions, your thoughts, and how your body feels. You are aware of these things, but you aren't judging these experiences as "good" or "bad." Mindfulness can help you learn to calm your mind and body to help you cope with illness, pain, and stress.  How does mindfulness help to relieve stress?  Mindfulness can help quiet your mind and relax your body. Studies show that it can help some people sleep better, feel less anxious,  and bring their blood pressure down. And it's been shown to help some people live and cope better with certain health problems like heart disease, depression, chronic pain, and cancer.  How do you practice mindfulness?  To be mindful is to pay attention, to be present, and to be accepting. Like any new skill or habit, being mindful can take practice.  When you're mindful, you do just one thing and you pay close attention to that one thing. For example, you may sit quietly and notice your emotions or how your food tastes and smells.  When you're present, you focus on the things that are happening right now. You let go of your thoughts about the past and the future. When you dwell on the past or the future, you miss moments that can heal and strengthen you. You may miss moments like hearing a child laugh or seeing a friendly face when you think you're all alone.  When you're accepting, you don't judge the present moment. Instead you accept your thoughts and feelings as they come.  You can practice anytime, anywhere, and in any way you choose. You can practice in many ways. Here are a few ideas:  While doing your chores, like washing the dishes, let your mind focus on what's in your hand. What does the dish feel like? Is the water warm or cold?  Go outside and take a few deep breaths. What is the air like? Is it warm or cold?  When you can, take some time at the start of your day to sit alone and think.  Take a slow walk by yourself. Count your steps while you breathe in and out.  Try  yoga breathing exercises, stretches, and poses to strengthen and relax your muscles.  At work, if you can, try to stop for a few moments each hour. Note how your body feels. Let yourself regroup and let your mind [place] before you return to what you were doing.  If you struggle with anxiety or "worry thoughts," imagine your mind as a blue sky and your worry thoughts as clouds. Now imagine those worry thoughts floating across your mind's  sky. Just let them pass by as you watch.  Follow-up care is a key part of your treatment and safety. Be sure to make and go to all appointments, and call your doctor if you are having problems. It's also a good idea to know your test results and keep a list of the medicines you take.  Where can you learn more?  Go to RecruitSuit.ca and enter M676 to learn more about "Learning About Mindfulness for Stress."  Current as of: November 02, 2022  Content Version: 14.3   [address] [place].   Care instructions adapted under license by [facility]. If you have questions about a medical condition or this instruction, always ask your healthcare professional. Larene Beach, [facility], disclaims any warranty or liability for your use of this information.         9 Ways to Cut Back on Drinking  Maybe you've found yourself drinking more alcohol than you'd prefer. If you want to cut back, here are some ideas to try.    Think before you drink.  Do you really want a drink, or is it just a habit? If you're used to having a drink at a certain time, try doing something else then.     Look for substitutes.  Find some no-alcohol drinks that you enjoy, like flavored seltzer water, tea with honey, or tonic with a slice of lime. Or try alcohol-free beer or "virgin" cocktails (without the alcohol).     Drink more water.  Use water to quench your thirst. Drink a glass of water before you have any alcohol. Have another glass along with every drink or between drinks.     Shrink your drink.  For example, have a bottle of beer instead of a pint. Use a smaller glass for wine. Choose drinks with lower alcohol content (ABV%). Or use less liquor and more mixer in cocktails.     Slow down.  It's easy to drink quickly and without thinking about it. Pay attention, and make each drink last longer.     Do the math.  Total up how much you spend on alcohol each month. How much is that a year? If you cut back, what could you do  with the money you save?     Take a break.  Choose a day or two each week when you won't drink at all. Notice how you feel on those days, physically and emotionally. How did you sleep? Do you feel better? Over time, add more break days.     Count calories.  Would you like to lose some weight? For some people that's a good motivator for cutting back. Figure out how many calories are in each drink. How many does that add up to in a day? In a week? In a month?     Practice saying no.  Be ready when someone offers you a drink. Try: "Thanks, I've had enough." Or "Thanks, but I'm cutting back." Or "No, thanks. I feel better when I drink less."  Current as of: February 16, 2022  Content Version: 14.3   [address] [place].   Care instructions adapted under license by [facility]. If you have questions about a medical condition or this instruction, always ask your healthcare professional. Larene Beach, [facility], disclaims any warranty or liability for your use of this information.       Learning About Emotional Support  When do you need emotional support?     You might find getting support from others helpful when you have a long-term health problem. Often people feel alone, confused, or scared when coping with an illness. But you aren't alone. Other people are going through the same thing you are and know how you feel.  Talking with others about your feelings can help you feel better.  Your family and friends can give you support. So can your doctor, a support group, or a church. If you have a support network, you will not feel as alone. You will learn new ways to deal with your situation, and you may try harder to overcome it.  Where you can get support  Family and friends: They can help you cope by giving you comfort and encouragement.  Counseling: Professional counseling can help you cope with situations that interfere with your life and cause stress. Counseling can help you understand and deal with your  illness.  Your doctor: Find a doctor you trust and feel comfortable with. Be open and honest about your fears and concerns. Your doctor can help you get the right medical treatments, including counseling.  Spiritual or religious groups: They can provide comfort and may be able to help you find counseling or other social support services.  Social groups: They can help you meet new people and get involved in activities you enjoy.  Community support groups: In a support group, you can talk to others who have dealt with the same problems or illness as you. You can encourage one another and learn ways to cope with tough emotions.  How can you find a support group?  Finding a support group that works for you may take time. There are many options. Some groups have a group leader who helps lead discussions or shares information. Others are less formal. Some meet in person, while others meet online.  Try using these resources to help you find the best support group for you.  Your doctor, health care team, or counselor.  People with the same health concern.  Your local church, mosque, synagogue, or other religious group.  A city, state, or national group that provides support for your health concern. Check your local library or community center for a list of these groups. Or look for information online.  Your local community, friends, and family.  Supportive relationships  A supportive relationship includes emotional support such as love, trust, and understanding, as well as advice and concrete help, such as help managing your time.  Reach out to others  Family and friends can help you. Ask them to:  Listen to you and give you encouragement. This can keep you from feeling hopeless or alone.  Help with small daily tasks or with bigger problems. A helping hand can keep you from feeling overwhelmed.  Help you manage a health problem. For example, ask them to go to doctor visits with you. Your loved ones can offer support by  being involved in your medical care.  Respect your relationships  A good relationship is also a two-way street. You count on  help from others, but they also count on you.  Know your friends' limits. You don't have to see or call your friends every day. If you are going through a rough patch, ask friends if you can contact them outside of the usual boundaries.  Don't always complain or talk about yourself. Know when it's time to stop talking and listen or just enjoy your friend's company.  Know that good friends can be a bad influence. For example, if a friend encourages you to drink when you know it will harm you, you may want to end the friendship.  Where can you learn more?  Go to RecruitSuit.ca and enter G092 to learn more about "Learning About Emotional Support."  Current as of: November 02, 2022  Content Version: 14.3   [address] [place].   Care instructions adapted under license by [facility]. If you have questions about a medical condition or this instruction, always ask your healthcare professional. Larene Beach, [facility], disclaims any warranty or liability for your use of this information.         Learning About Being Active as an Older Adult  Why is being active important as you get older?     Being active is one of the best things you can do for your health. And it's never too late to start. Being active--or getting active, if you aren't already--has definite benefits. It can:  Give you more energy,  Keep your mind sharp.  Improve balance to reduce your risk of falls.  Help you manage chronic illness with fewer medicines.  No matter how old you are, how fit you are, or what health problems you have, there is a form of activity that will work for you. And the more physical activity you can do, the better your overall health will be.  What kinds of activity can help you stay healthy?  Being more active will make your daily activities easier. Physical activity includes planned  exercise and things you do in daily life. There are four types of activity:  Aerobic.  Doing aerobic activity makes your heart and lungs strong.  Includes walking, dancing, and gardening.  Aim for at least 2 hours spread throughout the week.  It improves your energy and can help you sleep better.  Muscle-strengthening.  This type of activity can help maintain muscle and strengthen bones.  Includes climbing stairs, using resistance bands, and lifting or carrying heavy loads.  Aim for at least twice a week.  It can help protect the knees and other joints.  Stretching.  Stretching gives you better range of motion in joints and muscles.  Includes upper arm stretches, calf stretches, and gentle yoga.  Aim for at least twice a week, preferably after your muscles are warmed up from other activities.  It can help you function better in daily life.  Balancing.  This helps you stay coordinated and have good posture.  Includes heel-to-toe walking, tai chi, and certain types of yoga.  Aim for at least 3 days a week.  It can reduce your risk of falling.  Even if you have a hard time meeting the recommendations, it's better to be more active than less active. All activity done in each category counts toward your weekly total. You'd be surprised how daily things like carrying groceries, keeping up with grandchildren, and taking the stairs can add up.  What keeps you from being active?  If you've had a hard time being more active, you're not alone.  Maybe you remember being able to do more. Or maybe you've never thought of yourself as being active. It's frustrating when you can't do the things you want. Being more active can help. What's holding you back?  Getting started.  Have a goal, but break it into easy tasks. Small steps build into big accomplishments.  Staying motivated.  If you feel like skipping your activity, remember your goal. Maybe you want to move better and stay independent. Every activity gets you one step  closer.  Not feeling your best.  Start with 5 minutes of an activity you enjoy. Prove to yourself you can do it. As you get comfortable, increase your time.  You may not be where you want to be. But you're in the process of getting there. Everyone starts somewhere.  How can you find safe ways to stay active?  Talk with your doctor about any physical challenges you're facing. Make a plan with your doctor if you have a health problem or aren't sure how to get started with activity.  If you're already active, ask your doctor if there is anything you should change to stay safe as your body and health change.  If you tend to feel dizzy after you take medicine, avoid activity at that time. Try being active before you take your medicine. This will reduce your risk of falls.  If you plan to be active at home, make sure to clear your space before you get started. Remove things like TV cords, coffee tables, and throw rugs. It's safest to have plenty of space to move freely.  The key to getting more active is to take it slow and steady. Try to improve only a little bit at a time. Pick just one area to improve on at first. And if an activity hurts, stop and talk to your doctor.  Where can you learn more?  Go to RecruitSuit.ca and enter P600 to learn more about "Learning About Being Active as an Older Adult."  Current as of: November 02, 2022  Content Version: 14.3   [address], [place].   Care instructions adapted under license by [facility]. If you have questions about a medical condition or this instruction, always ask your healthcare professional. Larene Beach, [facility], disclaims any warranty or liability for your use of this information.         Starting a Weight-Loss Plan: Care Instructions  Overview    It can be a challenge to lose weight. But your doctor can help you make a weight-loss plan that meets your needs.  You don't have to make a lot of big changes at once. A better idea might be to  focus on small changes and stick with them. When those changes become habit, you can add a few more changes.  Some people find it helpful to take an exercise or nutrition class. If you have questions, ask your doctor about seeing a registered dietitian or an exercise specialist. You might also think about joining a weight-loss support group.  If you're not ready to make changes right now, try to pick a date in the future. Then make an appointment with your doctor to talk about when and how you'll get started with a plan.  Follow-up care is a key part of your treatment and safety. Be sure to make and go to all appointments, and call your doctor if you are having problems. It's also a good idea to know your test results and keep a list  of the medicines you take.  How can you care for yourself as you start a weight-loss plan?  Set realistic goals. Many people expect to lose much more weight than is likely. A weight loss of 5% to 10% of your body weight may be enough to improve your health.  Get family and friends involved to provide support. Talk to them about why you are trying to lose weight, and ask them to help. They can help by participating in exercise and having meals with you, even if they may be eating something different.  Find what works best for you. If you do not have time or do not like to cook, a program that offers meal replacement bars or shakes may be better for you. Or if you like to prepare meals, finding a plan that includes daily menus and recipes may be best.  Ask your doctor about other health professionals who can help you achieve your weight-loss goals.  A dietitian can help you make healthy changes in your diet.  An exercise specialist or personal trainer can help you develop a safe and effective exercise program.  A counselor or psychiatrist can help you cope with issues such as depression, anxiety, or family problems that can make it hard to focus on weight loss.  Consider joining a support  group for people who are trying to lose weight. Your doctor can suggest groups in your area.  Where can you learn more?  Go to RecruitSuit.ca and enter U357 to learn more about "Starting a Weight-Loss Plan: Care Instructions."  Current as of: August 02, 2022  Content Version: 14.3   [address], [place].   Care instructions adapted under license by [facility]. If you have questions about a medical condition or this instruction, always ask your healthcare professional. Larene Beach, [facility], disclaims any warranty or liability for your use of this information.         A Healthy Heart: Care Instructions  Overview     Coronary artery disease, also called heart disease, occurs when a substance called plaque builds up in the vessels that supply oxygen-rich blood to your heart muscle. This can narrow the blood vessels and reduce blood flow. A heart attack happens when blood flow is completely blocked. A high-fat diet, smoking, and other factors increase the risk of heart disease.  Your doctor has found that you have a chance of having heart disease. A heart-healthy lifestyle can help keep your heart healthy and prevent heart disease. This lifestyle includes eating healthy, being active, staying at a weight that's healthy for you, and not smoking or using tobacco. It also includes taking medicines as directed, managing other health conditions, and trying to get a healthy amount of sleep.  Follow-up care is a key part of your treatment and safety. Be sure to make and go to all appointments, and call your doctor if you are having problems. It's also a good idea to know your test results and keep a list of the medicines you take.  How can you care for yourself at home?  Diet    Use less salt when you cook and eat. This helps lower your blood pressure. Taste food before salting. Add only a little salt when you think you need it. With time, your taste buds will adjust to less salt.     Eat fewer  snack items, fast foods, canned soups, and other high-salt, high-fat, processed foods.     Read food labels and try  to avoid saturated and trans fats. They increase your risk of heart disease by raising cholesterol levels.     Limit the amount of solid fat--butter, margarine, and shortening--you eat. Use olive, peanut, or canola oil when you cook. Bake, broil, and steam foods instead of frying them.     Eat a variety of fruit and vegetables every day. Dark green, deep orange, red, or yellow fruits and vegetables are especially good for you. Examples include spinach, carrots, peaches, and berries.     Foods high in fiber can reduce your cholesterol and provide important vitamins and minerals. High-fiber foods include whole-grain cereals and breads, oatmeal, beans, brown rice, citrus fruits, and apples.     Eat lean proteins. Heart-healthy proteins include seafood, lean meats and poultry, eggs, beans, peas, nuts, seeds, and soy products.     Limit drinks and foods with added sugar. These include candy, desserts, and soda pop.   Heart-healthy lifestyle    If your doctor recommends it, get more exercise. For many people, walking is a good choice. Or you may want to swim, bike, or do other activities. Bit by bit, increase the time you're active every day. Try for at least 30 minutes on most days of the week.     Try to quit or cut back on using tobacco and other nicotine products. This includes smoking and vaping. If you need help quitting, talk to your doctor about stop-smoking programs and medicines. These can increase your chances of quitting for good. Quitting is one of the most important things you can do to protect your heart. It is never too late to quit. Try to avoid secondhand smoke too.     Stay at a weight that's healthy for you. Talk to your doctor if you need help losing weight.     Try to get 7 to 9 hours of sleep each night.     Limit alcohol to 2 drinks a day for men and 1 drink a day for women. Too much  alcohol can cause health problems.     Manage other health problems such as diabetes, high blood pressure, and high cholesterol. If you think you may have a problem with alcohol or drug use, talk to your doctor.   Medicines    Take your medicines exactly as prescribed. Call your doctor if you think you are having a problem with your medicine.     If your doctor recommends aspirin, take the amount directed each day. Make sure you take aspirin and not another kind of pain reliever, such as acetaminophen (Tylenol).   When should you call for help?   Call 911 if you have symptoms of a heart attack. These may include:    Chest pain or pressure, or a strange feeling in the chest.     Sweating.     Shortness of breath.     Pain, pressure, or a strange feeling in the back, neck, jaw, or upper belly or in one or both shoulders or arms.     Lightheadedness or sudden weakness.     A fast or irregular heartbeat.   After you call 911, the operator may tell you to chew 1 adult-strength or 2 to 4 low-dose aspirin. Wait for an ambulance. Do not try to drive yourself.  Watch closely for changes in your health, and be sure to contact your doctor if you have any problems.  Where can you learn more?  Go to [employer] and enter F075 to  learn more about "A Healthy Heart: Care Instructions."  Current as of: November 02, 2022  Content Version: 14.3   [address], [place].   Care instructions adapted under license by [facility]. If you have questions about a medical condition or this instruction, always ask your healthcare professional. Larene Beach, [facility], disclaims any warranty or liability for your use of this information.    Personalized Preventive Plan for Patrick Welch - 05/04/2023  Medicare offers a range of preventive health benefits. Some of the tests and screenings are paid in full while other may be subject to a deductible, co-insurance, and/or copay.  Some of these benefits include a  comprehensive review of your medical history including lifestyle, illnesses that may run in your family, and various assessments and screenings as appropriate.  After reviewing your medical record and screening and assessments performed today your provider may have ordered immunizations, labs, imaging, and/or referrals for you.  A list of these orders (if applicable) as well as your Preventive Care list are included within your After Visit Summary for your review.           Learning About Mindfulness for Stress  What are mindfulness and stress?     Stress is your body's response to a hard situation. Your body can have a physical, emotional, or mental response. A lot of things can cause stress. You may feel stress when you go on a job interview, take a test, or run a race. This kind of short-term stress is normal and even useful. It can help you if you need to work hard or react quickly.  Stress also can last a long time. Long-term stress is caused by stressful situations or events. Examples of long-term stress include long-term health problems, ongoing problems at work, and conflicts in your family. Long-term stress can harm your health.  Mindfulness is a focus only on things happening in the present moment. It's a process of purposefully paying attention to and being aware of your surroundings, your emotions, your thoughts, and how your body feels. You are aware of these things, but you aren't judging these experiences as "good" or "bad." Mindfulness can help you learn to calm your mind and body to help you cope with illness, pain, and stress.  How does mindfulness help to relieve stress?  Mindfulness can help quiet your mind and relax your body. Studies show that it can help some people sleep better, feel less anxious, and bring their blood pressure down. And it's been shown to help some people live and cope better with certain health problems like heart disease, depression, chronic pain, and cancer.  How do you  practice mindfulness?  To be mindful is to pay attention, to be present, and to be accepting. Like any new skill or habit, being mindful can take practice.  When you're mindful, you do just one thing and you pay close attention to that one thing. For example, you may sit quietly and notice your emotions or how your food tastes and smells.  When you're present, you focus on the things that are happening right now. You let go of your thoughts about the past and the future. When you dwell on the past or the future, you miss moments that can heal and strengthen you. You may miss moments like hearing a child laugh or seeing a friendly face when you think you're all alone.  When you're accepting, you don't judge the present moment. Instead you accept your thoughts and feelings  as they come.  You can practice anytime, anywhere, and in any way you choose. You can practice in many ways. Here are a few ideas:  While doing your chores, like washing the dishes, let your mind focus on what's in your hand. What does the dish feel like? Is the water warm or cold?  Go outside and take a few deep breaths. What is the air like? Is it warm or cold?  When you can, take some time at the start of your day to sit alone and think.  Take a slow walk by yourself. Count your steps while you breathe in and out.  Try yoga breathing exercises, stretches, and poses to strengthen and relax your muscles.  At work, if you can, try to stop for a few moments each hour. Note how your body feels. Let yourself regroup and let your mind Lazo before you return to what you were doing.  If you struggle with anxiety or "worry thoughts," imagine your mind as a blue sky and your worry thoughts as clouds. Now imagine those worry thoughts floating across your mind's sky. Just let them pass by as you watch.  Follow-up care is a key part of your treatment and safety. Be sure to make and go to all appointments, and call your doctor if you are having problems. It's  also a good idea to know your test results and keep a list of the medicines you take.  Where can you learn more?  Go to [employer] and enter M676 to learn more about "Learning About Mindfulness for Stress."  Current as of: November 02, 2022  Content Version: 14.3   [address], [place].   Care instructions adapted under license by [facility]. If you have questions about a medical condition or this instruction, always ask your healthcare professional. Larene Beach, [facility], disclaims any warranty or liability for your use of this information.         9 Ways to Cut Back on Drinking  Maybe you've found yourself drinking more alcohol than you'd prefer. If you want to cut back, here are some ideas to try.    Think before you drink.  Do you really want a drink, or is it just a habit? If you're used to having a drink at a certain time, try doing something else then.     Look for substitutes.  Find some no-alcohol drinks that you enjoy, like flavored seltzer water, tea with honey, or tonic with a slice of lime. Or try alcohol-free beer or "virgin" cocktails (without the alcohol).     Drink more water.  Use water to quench your thirst. Drink a glass of water before you have any alcohol. Have another glass along with every drink or between drinks.     Shrink your drink.  For example, have a bottle of beer instead of a pint. Use a smaller glass for wine. Choose drinks with lower alcohol content (ABV%). Or use less liquor and more mixer in cocktails.     Slow down.  It's easy to drink quickly and without thinking about it. Pay attention, and make each drink last longer.     Do the math.  Total up how much you spend on alcohol each month. How much is that a year? If you cut back, what could you do with the money you save?     Take a break.  Choose a day or two each week when you won't drink at all. Notice  how you feel on those days, physically and emotionally. How did you sleep? Do you feel  better? Over time, add more break days.     Count calories.  Would you like to lose some weight? For some people that's a good motivator for cutting back. Figure out how many calories are in each drink. How many does that add up to in a day? In a week? In a month?     Practice saying no.  Be ready when someone offers you a drink. Try: "Thanks, I've had enough." Or "Thanks, but I'm cutting back." Or "No, thanks. I feel better when I drink less."   Current as of: February 16, 2022  Content Version: 14.3   [address] [place].   Care instructions adapted under license by [facility]. If you have questions about a medical condition or this instruction, always ask your healthcare professional. Larene Beach, [facility], disclaims any warranty or liability for your use of this information.       Learning About Emotional Support  When do you need emotional support?     You might find getting support from others helpful when you have a long-term health problem. Often people feel alone, confused, or scared when coping with an illness. But you aren't alone. Other people are going through the same thing you are and know how you feel.  Talking with others about your feelings can help you feel better.  Your family and friends can give you support. So can your doctor, a support group, or a church. If you have a support network, you will not feel as alone. You will learn new ways to deal with your situation, and you may try harder to overcome it.  Where you can get support  Family and friends: They can help you cope by giving you comfort and encouragement.  Counseling: Professional counseling can help you cope with situations that interfere with your life and cause stress. Counseling can help you understand and deal with your illness.  Your doctor: Find a doctor you trust and feel comfortable with. Be open and honest about your fears and concerns. Your doctor can help you get the right medical treatments, including  counseling.  Spiritual or religious groups: They can provide comfort and may be able to help you find counseling or other social support services.  Social groups: They can help you meet new people and get involved in activities you enjoy.  Community support groups: In a support group, you can talk to others who have dealt with the same problems or illness as you. You can encourage one another and learn ways to cope with tough emotions.  How can you find a support group?  Finding a support group that works for you may take time. There are many options. Some groups have a group leader who helps lead discussions or shares information. Others are less formal. Some meet in person, while others meet online.  Try using these resources to help you find the best support group for you.  Your doctor, health care team, or counselor.  People with the same health concern.  Your local church, mosque, synagogue, or other religious group.  A city, state, or national group that provides support for your health concern. Check your local library or community center for a list of these groups. Or look for information online.  Your local community, friends, and family.  Supportive relationships  A supportive relationship includes emotional support such as love, trust, and understanding,  as well as advice and concrete help, such as help managing your time.  Reach out to others  Family and friends can help you. Ask them to:  Listen to you and give you encouragement. This can keep you from feeling hopeless or alone.  Help with small daily tasks or with bigger problems. A helping hand can keep you from feeling overwhelmed.  Help you manage a health problem. For example, ask them to go to doctor visits with you. Your loved ones can offer support by being involved in your medical care.  Respect your relationships  A good relationship is also a two-way street. You count on help from others, but they also count on you.  Know your friends'  limits. You don't have to see or call your friends every day. If you are going through a rough patch, ask friends if you can contact them outside of the usual boundaries.  Don't always complain or talk about yourself. Know when it's time to stop talking and listen or just enjoy your friend's company.  Know that good friends can be a bad influence. For example, if a friend encourages you to drink when you know it will harm you, you may want to end the friendship.  Where can you learn more?  Go to [employer] and enter G092 to learn more about "Learning About Emotional Support."  Current as of: November 02, 2022  Content Version: 14.3   [address], [place].   Care instructions adapted under license by [facility]. If you have questions about a medical condition or this instruction, always ask your healthcare professional. Larene Beach, [facility], disclaims any warranty or liability for your use of this information.         Learning About Being Active as an Older Adult  Why is being active important as you get older?     Being active is one of the best things you can do for your health. And it's never too late to start. Being active--or getting active, if you aren't already--has definite benefits. It can:  Give you more energy,  Keep your mind sharp.  Improve balance to reduce your risk of falls.  Help you manage chronic illness with fewer medicines.  No matter how old you are, how fit you are, or what health problems you have, there is a form of activity that will work for you. And the more physical activity you can do, the better your overall health will be.  What kinds of activity can help you stay healthy?  Being more active will make your daily activities easier. Physical activity includes planned exercise and things you do in daily life. There are four types of activity:  Aerobic.  Doing aerobic activity makes your heart and lungs strong.  Includes walking, dancing, and  gardening.  Aim for at least 2 hours spread throughout the week.  It improves your energy and can help you sleep better.  Muscle-strengthening.  This type of activity can help maintain muscle and strengthen bones.  Includes climbing stairs, using resistance bands, and lifting or carrying heavy loads.  Aim for at least twice a week.  It can help protect the knees and other joints.  Stretching.  Stretching gives you better range of motion in joints and muscles.  Includes upper arm stretches, calf stretches, and gentle yoga.  Aim for at least twice a week, preferably after your muscles are warmed up from other activities.  It can help you function better in daily  life.  Balancing.  This helps you stay coordinated and have good posture.  Includes heel-to-toe walking, tai chi, and certain types of yoga.  Aim for at least 3 days a week.  It can reduce your risk of falling.  Even if you have a hard time meeting the recommendations, it's better to be more active than less active. All activity done in each category counts toward your weekly total. You'd be surprised how daily things like carrying groceries, keeping up with grandchildren, and taking the stairs can add up.  What keeps you from being active?  If you've had a hard time being more active, you're not alone. Maybe you remember being able to do more. Or maybe you've never thought of yourself as being active. It's frustrating when you can't do the things you want. Being more active can help. What's holding you back?  Getting started.  Have a goal, but break it into easy tasks. Small steps build into big accomplishments.  Staying motivated.  If you feel like skipping your activity, remember your goal. Maybe you want to move better and stay independent. Every activity gets you one step closer.  Not feeling your best.  Start with 5 minutes of an activity you enjoy. Prove to yourself you can do it. As you get comfortable, increase your time.  You may not be where you  want to be. But you're in the process of getting there. Everyone starts somewhere.  How can you find safe ways to stay active?  Talk with your doctor about any physical challenges you're facing. Make a plan with your doctor if you have a health problem or aren't sure how to get started with activity.  If you're already active, ask your doctor if there is anything you should change to stay safe as your body and health change.  If you tend to feel dizzy after you take medicine, avoid activity at that time. Try being active before you take your medicine. This will reduce your risk of falls.  If you plan to be active at home, make sure to clear your space before you get started. Remove things like TV cords, coffee tables, and throw rugs. It's safest to have plenty of space to move freely.  The key to getting more active is to take it slow and steady. Try to improve only a little bit at a time. Pick just one area to improve on at first. And if an activity hurts, stop and talk to your doctor.  Where can you learn more?  Go to [employer] and enter P600 to learn more about "Learning About Being Active as an Older Adult."  Current as of: November 02, 2022  Content Version: 14.3   [address] [place].   Care instructions adapted under license by [facility]. If you have questions about a medical condition or this instruction, always ask your healthcare professional. Larene Beach, [facility], disclaims any warranty or liability for your use of this information.         Starting a Weight-Loss Plan: Care Instructions  Overview    It can be a challenge to lose weight. But your doctor can help you make a weight-loss plan that meets your needs.  You don't have to make a lot of big changes at once. A better idea might be to focus on small changes and stick with them. When those changes become habit, you can add a few more changes.  Some people find it helpful to take an  exercise or nutrition class. If  you have questions, ask your doctor about seeing a registered dietitian or an exercise specialist. You might also think about joining a weight-loss support group.  If you're not ready to make changes right now, try to pick a date in the future. Then make an appointment with your doctor to talk about when and how you'll get started with a plan.  Follow-up care is a key part of your treatment and safety. Be sure to make and go to all appointments, and call your doctor if you are having problems. It's also a good idea to know your test results and keep a list of the medicines you take.  How can you care for yourself as you start a weight-loss plan?  Set realistic goals. Many people expect to lose much more weight than is likely. A weight loss of 5% to 10% of your body weight may be enough to improve your health.  Get family and friends involved to provide support. Talk to them about why you are trying to lose weight, and ask them to help. They can help by participating in exercise and having meals with you, even if they may be eating something different.  Find what works best for you. If you do not have time or do not like to cook, a program that offers meal replacement bars or shakes may be better for you. Or if you like to prepare meals, finding a plan that includes daily menus and recipes may be best.  Ask your doctor about other health professionals who can help you achieve your weight-loss goals.  A dietitian can help you make healthy changes in your diet.  An exercise specialist or personal trainer can help you develop a safe and effective exercise program.  A counselor or psychiatrist can help you cope with issues such as depression, anxiety, or family problems that can make it hard to focus on weight loss.  Consider joining a support group for people who are trying to lose weight. Your doctor can suggest groups in your area.  Where can you learn more?  Go to [employer] and enter U357  to learn more about "Starting a Weight-Loss Plan: Care Instructions."  Current as of: August 02, 2022  Content Version: 14.3   [address] [place].   Care instructions adapted under license by [facility]. If you have questions about a medical condition or this instruction, always ask your healthcare professional. Larene Beach, [facility], disclaims any warranty or liability for your use of this information.         A Healthy Heart: Care Instructions  Overview     Coronary artery disease, also called heart disease, occurs when a substance called plaque builds up in the vessels that supply oxygen-rich blood to your heart muscle. This can narrow the blood vessels and reduce blood flow. A heart attack happens when blood flow is completely blocked. A high-fat diet, smoking, and other factors increase the risk of heart disease.  Your doctor has found that you have a chance of having heart disease. A heart-healthy lifestyle can help keep your heart healthy and prevent heart disease. This lifestyle includes eating healthy, being active, staying at a weight that's healthy for you, and not smoking or using tobacco. It also includes taking medicines as directed, managing other health conditions, and trying to get a healthy amount of sleep.  Follow-up care is a key part of your treatment and safety. Be sure to make and go  to all appointments, and call your doctor if you are having problems. It's also a good idea to know your test results and keep a list of the medicines you take.  How can you care for yourself at home?  Diet    Use less salt when you cook and eat. This helps lower your blood pressure. Taste food before salting. Add only a little salt when you think you need it. With time, your taste buds will adjust to less salt.     Eat fewer snack items, fast foods, canned soups, and other high-salt, high-fat, processed foods.     Read food labels and try to avoid saturated and trans fats. They increase your risk of  heart disease by raising cholesterol levels.     Limit the amount of solid fat--butter, margarine, and shortening--you eat. Use olive, peanut, or canola oil when you cook. Bake, broil, and steam foods instead of frying them.     Eat a variety of fruit and vegetables every day. Dark green, deep orange, red, or yellow fruits and vegetables are especially good for you. Examples include spinach, carrots, peaches, and berries.     Foods high in fiber can reduce your cholesterol and provide important vitamins and minerals. High-fiber foods include whole-grain cereals and breads, oatmeal, beans, brown rice, citrus fruits, and apples.     Eat lean proteins. Heart-healthy proteins include seafood, lean meats and poultry, eggs, beans, peas, nuts, seeds, and soy products.     Limit drinks and foods with added sugar. These include candy, desserts, and soda pop.   Heart-healthy lifestyle    If your doctor recommends it, get more exercise. For many people, walking is a good choice. Or you may want to swim, bike, or do other activities. Bit by bit, increase the time you're active every day. Try for at least 30 minutes on most days of the week.     Try to quit or cut back on using tobacco and other nicotine products. This includes smoking and vaping. If you need help quitting, talk to your doctor about stop-smoking programs and medicines. These can increase your chances of quitting for good. Quitting is one of the most important things you can do to protect your heart. It is never too late to quit. Try to avoid secondhand smoke too.     Stay at a weight that's healthy for you. Talk to your doctor if you need help losing weight.     Try to get 7 to 9 hours of sleep each night.     Limit alcohol to 2 drinks a day for men and 1 drink a day for women. Too much alcohol can cause health problems.     Manage other health problems such as diabetes, high blood pressure, and high cholesterol. If you think you may have a problem with alcohol  or drug use, talk to your doctor.   Medicines    Take your medicines exactly as prescribed. Call your doctor if you think you are having a problem with your medicine.     If your doctor recommends aspirin, take the amount directed each day. Make sure you take aspirin and not another kind of pain reliever, such as acetaminophen (Tylenol).   When should you call for help?   Call 911 if you have symptoms of a heart attack. These may include:    Chest pain or pressure, or a strange feeling in the chest.     Sweating.  Shortness of breath.     Pain, pressure, or a strange feeling in the back, neck, jaw, or upper belly or in one or both shoulders or arms.     Lightheadedness or sudden weakness.     A fast or irregular heartbeat.   After you call 911, the operator may tell you to chew 1 adult-strength or 2 to 4 low-dose aspirin. Wait for an ambulance. Do not try to drive yourself.  Watch closely for changes in your health, and be sure to contact your doctor if you have any problems.  Where can you learn more?  Go to [employer] and enter F075 to learn more about "A Healthy Heart: Care Instructions."  Current as of: November 02, 2022  Content Version: 14.3   [address], [place].   Care instructions adapted under license by [facility]. If you have questions about a medical condition or this instruction, always ask your healthcare professional. [place], [facility], disclaims any warranty or liability for your use of this information.    Personalized Preventive Plan for Patrick Welch - 05/04/2023  Medicare offers a range of preventive health benefits. Some of the tests and screenings are paid in full while other may be subject to a deductible, co-insurance, and/or copay.  Some of these benefits include a comprehensive review of your medical history including lifestyle, illnesses that may run in your family, and various assessments and screenings as appropriate.  After reviewing your medical  record and screening and assessments performed today your provider may have ordered immunizations, labs, imaging, and/or referrals for you.  A list of these orders (if applicable) as well as your Preventive Care list are included within your After Visit Summary for your review.

## 2023-05-04 NOTE — Assessment & Plan Note (Addendum)
 Chronic, at goal (stable), continue current treatment plan, medication adherence emphasized, and lifestyle modifications recommended

## 2023-05-04 NOTE — Progress Notes (Signed)
Vaccines added

## 2023-05-04 NOTE — Progress Notes (Signed)
Medicare Annual Wellness Visit    [name] is here for Medicare AWV Patrick Welch is a 78 y.o. male who presents for Patrick Welch AWV. Their most recent medicare wellness examination was done over 1 yr ago. )    Assessment & Plan        Assessment & Plan  Medicare annual wellness visit, subsequent    He received an influenza vaccine approximately 1-2 months ago. He had a pneumonia vaccine in 2010 but is due for an update. He received one shingles vaccine in 2023 but is unsure if he completed the series. A prescription for the pneumonia vaccine has been provided.         Current mild episode of major depressive disorder, unspecified whether recurrent (HCC)    His mood appears to be significantly influenced by his current circumstances, including memory issues and interpersonal conflicts. Previous recommendations from [facility] in May 2024 included counseling for coping strategies and potential adjustments to his antidepressant regimen. Memory changes were assessed in May 2024 and were deemed more likely related to mood disturbances rather than indicative of dementia. A follow-up evaluation was suggested for 2025. Counseling is strongly recommended, and a list of potential providers has been provided. A prescription for sertraline 25 mg has been issued, with instructions to take it in the morning. The prescription will be sent to [facility] in [place]. A reassessment of his condition will be conducted in approximately 4 weeks.    Orders:    sertraline (ZOLOFT) 25 MG tablet; Take 1 tablet by mouth daily    Other specified anxiety disorders       Orders:    sertraline (ZOLOFT) 25 MG tablet; Take 1 tablet by mouth daily    Type 2 diabetes mellitus with hyperglycemia, without long-term current use of insulin (HCC)    Chronic, at goal (stable), continue current treatment plan, medication adherence emphasized, and lifestyle modifications recommended           Stage 3a chronic kidney disease (HCC)    Monitored by specialist- no  acute findings meriting change in the plan           Postnasal drip    He reports persistent postnasal drip, leading to frequent throat clearing and hoarseness. Previous treatment with ipratropium spray was ineffective. A referral to Dr. Margarette Asal, an ENT specialist, has been made for further evaluation and management.    Orders:    [facility] - Paulla Dolly, MD, Otolaryngology, [place]    Memory changes    Reviewed geropartners note.  Encouraged retesting the spring.  MMSE fully normal today.         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 He also identified that sugar-free Ice Breakers containing sorbitol may have contributed to diarrhea and alleviated constipation.  Chronic bilateral low back pain without sciatica    The patient underwent nerve [facility], which did not result in any change in his back pain.         Morbid (severe) obesity due to excess calories    Reinforced calorie tracking         Adult BMI 35.0-35.9 kg/sq m    Diet/activity recs given             Results  Laboratory Studies  Blood sugar has been steady. Testosterone level was normal. Blood counts have been normal.       Return in about 4 weeks (around 06/01/2023) for Depression/Anxiety after new medication.     Subjective   The following  acute and/or chronic problems were also addressed today:  HTN, mood  History of Present Illness  The patient is a 78 year old male who presents for his annual wellness visit.    He reports experiencing intermittent memory issues, which have been a source of conflict with his partner, Patrick Welch. He expresses frustration over these confrontations and perceives them as detrimental to his mental health. He recalls a period when Patrick Welch was severely depressed and bedridden for two years, during which he assumed responsibility for her personal and medical affairs. He does not recall seeking counseling or trying other medications for depression or anxiety. He believes his symptoms are primarily related to depression, lack of interest, and motivation rather than rumination. He does not recall the results of his memory tests conducted in May 2024. He has an upcoming appointment with his nephrologist in March 2025.    He continues to struggle with postnasal drip, which often results in a coated throat and hoarseness. Despite several visits and tests at an ENT clinic, the recommended treatment of nerve cauterization was not pursued due to inadequate explanation. He has previously tried ipratropium spray and Afrin, which he finds effective for nasal congestion at night. He is open to seeking a second opinion from another ENT specialist.    He has been managing his constipation by consuming two tablespoons of olive oil daily, which he reports as beneficial. However, he has not observed any weight loss. He also mentions occasional diarrhea, which he attributes to the consumption of sugar-free Ice Breakers.    He received an influenza vaccine approximately one to two months ago and a shingles vaccine in 2023. He is uncertain about his pneumonia vaccine status. He reports no chest pain but mentions a sensation akin to a muscle pull. He monitors his weight weekly and notes fluctuations of five pounds. He acknowledges a lack of exercise  and admits to not maintaining a healthy diet. He typically consumes eight ounces of water before and after each meal.    Supplemental Information  He had an ablation of some nerves in the lower part of his back at [facility], but there was no change in his back pain.    MEDICATIONS  Current: mirtazapine, Afrin, ipratropium spray  Past: sertraline    IMMUNIZATIONS  He received an influenza vaccine approximately one to two months ago and a shingles vaccine in 2023. He is uncertain about his pneumonia vaccine status.      Patient's complete Health Risk Assessment and screening values have been reviewed and are found in Flowsheets. The following problems were reviewed today and where indicated follow up appointments were made and/or referrals ordered.    Positive Risk Factor Screenings with Interventions:  Depression:  PHQ-2 Score: 6  PHQ-9 Total Score: 10  Total Score Interpretation: 10-14 = moderate depression  Interventions:  Amenable to trial of medication.  Starting sertraline today.    Alcohol Screening:  AUDIT-C Score: 5  AUDIT Total Score: 5  Total Score Interpretation: 0-7 suggests low risk alcohol consumption but assess individual risks   Interventions:  Alcohol consumption guidelines reviewed. Moderation recommended           General HRA Questions:  Select all that apply: (!) Loneliness  Interventions - Loneliness:  Recommended therapy.  Printed and provided list of options.      Inactivity:  On average, how many days per week do you engage in moderate to strenuous exercise (like a brisk walk)?: 0 days (!) Abnormal  On average, how many minutes do you engage in exercise at this level?: 0 min  Interventions:  Encourage return to daily walking     Abnormal BMI (obese):  Body mass index is 35.54 kg/m. (!) Abnormal  Interventions:  Reinforced calorie counting, carbohydrate control.                   Objective   Vitals:    05/04/23 0957   BP: 120/71   Site: Left Upper Arm   Position: Sitting    Cuff Size: Medium Adult   Pulse: 95   Resp: 16   Temp: 97.7 F (36.5 C)   TempSrc: Oral   SpO2: 98%   Weight: 115.6 kg (254 lb 12.8 oz)   Height: 1.803 m (5\' 11" )      Body mass index is 35.54 kg/m.        Physical Exam  Throat was examined.  Lungs were auscultated.  Heart was examined.   General: Well developed, in no acute distress. Obese.  HEENT: No jaundice  Neck: Supple, no stiffness  Heart: Normal rate; no murmurs  Respiratory: Clear bilaterally x 4, no wheezing or rales  Extremities:  No edema, normal cap refill, no cyanosis.  Musculoskeletal: No clubbing, no deformities  Neuro: A&Ox3, speech clear, gait stable, cooperative, no focal neurologic deficits  Skin: Skin color is normal. No rashes or lesions. Non diaphoretic, moist.            Allergies   Allergen Reactions    Tramadol Itching     All over    All over      All over All over      All over  All over All over     Prior to Visit Medications    Medication Sig Taking? Authorizing Provider   gabapentin (NEURONTIN) 300 MG capsule Take 1 capsule by mouth 4 times daily. Yes [provider]   sertraline (ZOLOFT) 25 MG tablet Take 1 tablet by mouth daily Yes Shakiah Wester, APRN - CNP   pneumococcal 20-valent conjugat (PREVNAR 20) 0.5 ML SUSY inj Inject 0.5 mLs into the muscle once for 1 dose Yes Adaria Hole, APRN - CNP   rosuvastatin (CRESTOR) 5 MG tablet Take 1 tablet by mouth daily Yes Kayler Rise, APRN - CNP   aspirin 81 MG EC tablet Take 1 tablet by mouth daily Yes Carmel Waddington, APRN - CNP   famotidine (PEPCID) 20 MG tablet Take 1 tablet by mouth 2 times daily Yes Franshesca Chipman, APRN - CNP   metFORMIN (GLUCOPHAGE) 500 MG tablet Take 1 tablet by mouth 2 times daily (with meals) Yes Tasha Jindra, APRN - CNP   valsartan (DIOVAN) 80 MG tablet Take 1  tablet by mouth daily Yes Escarlet Saathoff, APRN - CNP   amLODIPine (NORVASC) 10 MG tablet Take 1 tablet by mouth daily Yes Jaymes Hang, APRN - CNP   tamsulosin (FLOMAX) 0.4 MG capsule  Take 1 capsule by mouth daily Yes Dajia Gunnels, APRN - CNP   mirtazapine (REMERON) 30 MG tablet Take 1 tablet by mouth nightly Yes Abundio Miu, MD   methocarbamol (ROBAXIN) 750 MG tablet Take 1 tablet by mouth as needed (pain) Yes [provider]   Melatonin 5 MG CAPS Take 5 capsules by mouth nightly Yes [provider]       CareTeam (Including outside providers/suppliers regularly involved in providing care):   Patient Care Team:  Theodore Demark, APRN - CNP as PCP - General (Family Medicine)  Theodore Demark, APRN - CNP as PCP - Empaneled Provider     Recommendations for Preventive Services Due: see orders and patient instructions/AVS.  Recommended screening schedule for the next 5-10 years is provided to the patient in written form: see Patient Instructions/AVS.     Reviewed and updated this visit:  Tobacco  Allergies  Meds  Problems  Med Hx  Surg Hx  Soc Hx  Fam Hx              The patient (or guardian, if applicable) and other individuals in attendance with the patient were advised that Artificial Intelligence will be utilized during this visit to record and process the conversation to generate a clinical note. The patient (or guardian, if applicable) and other individuals in attendance at the appointment consented to the use of AI, including the recording.

## 2023-05-04 NOTE — Assessment & Plan Note (Addendum)
 Monitored by specialist- no acute findings meriting change in the plan

## 2023-05-04 NOTE — Progress Notes (Signed)
Chief Complaint   Patient presents with    Medicare AWV     Patrick Welch is a 78 y.o. male who presents for his/her AWV. Their most recent medicare wellness examination was done over 1 yr ago.      "Have you been to the ER, urgent care clinic since your last visit?  Hospitalized since your last visit?"    NO    "Have you seen or consulted any other health care providers outside of [facility] since your last visit?"    NO            Click Here for Release of Records Request

## 2023-05-15 MED ORDER — MIRTAZAPINE 30 MG PO TABS
30 MG | ORAL_TABLET | Freq: Every evening | ORAL | 1 refills | Status: DC
Start: 2023-05-15 — End: 2023-08-23

## 2023-05-15 MED ORDER — VALSARTAN 80 MG PO TABS
80 | ORAL_TABLET | Freq: Every day | ORAL | 1 refills | 90.00000 days | Status: DC
Start: 2023-05-15 — End: 2023-12-01

## 2023-05-25 ENCOUNTER — Encounter

## 2023-05-25 ENCOUNTER — Ambulatory Visit: Admit: 2023-05-25 | Discharge: 2023-05-25 | Payer: MEDICARE | Attending: Family Medicine | Primary: Family Medicine

## 2023-05-25 VITALS — BP 107/63 | HR 66 | Temp 97.80000°F | Resp 16 | Ht 71.0 in | Wt 252.0 lb

## 2023-05-25 DIAGNOSIS — F320 Major depressive disorder, single episode, mild: Secondary | ICD-10-CM

## 2023-05-25 LAB — URINALYSIS WITH REFLEX TO CULTURE
BACTERIA, URINE: NEGATIVE /HPF
Bilirubin, Urine: NEGATIVE
Blood, Urine: NEGATIVE
Glucose, Ur: NEGATIVE mg/dL
Ketones, Urine: NEGATIVE mg/dL
Leukocyte Esterase, Urine: NEGATIVE
Nitrite, Urine: NEGATIVE
Protein, UA: NEGATIVE mg/dL
Specific Gravity, UA: 1.008 (ref 1.003–1.030)
Urobilinogen, Urine: 0.2 EU/dL (ref 0.2–1.0)
pH, Urine: 5.5 (ref 5.0–8.0)

## 2023-05-25 LAB — HEMOGLOBIN A1C
Estimated Avg Glucose: 128 mg/dL
Hemoglobin A1C: 6.1 % — ABNORMAL HIGH (ref 4.0–5.6)

## 2023-05-25 LAB — BASIC METABOLIC PANEL
Anion Gap: 4 mmol/L (ref 2–12)
BUN/Creatinine Ratio: 9 — ABNORMAL LOW (ref 12–20)
BUN: 12 mg/dL (ref 6–20)
CO2: 27 mmol/L (ref 21–32)
Calcium: 9.4 mg/dL (ref 8.5–10.1)
Chloride: 106 mmol/L (ref 97–108)
Creatinine: 1.28 mg/dL (ref 0.70–1.30)
Est, Glom Filt Rate: 58 mL/min/{1.73_m2} — ABNORMAL LOW (ref >60)
Glucose: 93 mg/dL (ref 65–100)
Potassium: 4.8 mmol/L (ref 3.5–5.1)
Sodium: 137 mmol/L (ref 136–145)

## 2023-05-25 LAB — PSA SCREENING: PSA: 0.5 ng/mL (ref 0.01–4.0)

## 2023-05-25 MED ORDER — SERTRALINE HCL 50 MG PO TABS
50 MG | ORAL_TABLET | Freq: Every day | ORAL | 0 refills | Status: DC
Start: 2023-05-25 — End: 2023-08-07

## 2023-05-25 NOTE — Patient Instructions (Signed)
 Diabetes:  Blood sugar goals:  Hemoglobin A1c under 7  Fasting blood sugar 80-120  Blood sugar 2 hours after a meal under 180, 4 hours after a meal under 120  No hypoglycemia (sugars under 70 and symptomatic low sugars)     Blood sugar control with diet and exercise:  Exercise 45 minutes per day.  This makes your insulin work better.  It also allows insulin levels to fall helping with weight loss.  Every night, try to fast from your evening meal to breakfast (at least 12 hours) without eating anything.  This uses stored energy in your liver and makes insulin work better.  Avoid simples sugars such as table sugar in drinks (sodas, lemonade, sweet tea, wine), desserts, candy.  Also avoid fruit juices and high fructose corn syrup.  Avoid frequent consumption of fruit especially grapes and bananas.  A single serving of fruit daily is all you should have.  Finally, drink less milk which has milk sugar in it.  Control your starch intake.  Men should have 3-4 carb portions per meal.  Women should have 2-3 carb portions per meal.  A carb serving is the equivalent of a slice of bread.     Control your blood pressure and cholesterol.  These problems are common in diabetes.  AND, don't smoke.  All of these problems contribute to heart disease and stroke risk.     Get a yearly eye exam and flu shot.    Make sure your vaccines are up to date particularly the pneumonia vaccines.     Inspect your feet daily.  Wear comfortable protective shoes and clean socks.  Seek medical care if there are sore, calluses or numbness and burning of your feet.

## 2023-05-25 NOTE — Other (Signed)
 Prostate level, blood sugar, urine all look good! Kidney numbers are better than we have seen in the last year!   Thanks, Theodore Demark, APRN - CNP

## 2023-05-25 NOTE — Assessment & Plan Note (Addendum)
 Chronic, at goal (stable), continue current treatment plan, medication adherence emphasized, and lifestyle modifications recommended

## 2023-05-25 NOTE — Assessment & Plan Note (Addendum)
 Unclear control, has been stable, will recheck today    Orders:    Basic Metabolic Panel; Future

## 2023-05-25 NOTE — Progress Notes (Signed)
 Chief Complaint   Patient presents with    Hypertension     Patrick Welch is a 78 y.o. male who presents for a follow up on HTN. Patient reports his BP has been around 120-130/70's mmHg.      "Have you been to the ER, urgent care clinic since your last visit?  Hospitalized since your last visit?"    NO    "Have you seen or consulted any other health care providers outside of [facility] since your last visit?"    NO          Click Here for Release of Records Request

## 2023-05-25 NOTE — Assessment & Plan Note (Addendum)
 Reports nocturnal frequency and incomplete bladder emptying. Stopped tamsulosin this week.  - Referral to [facility] for evaluation, including potential prostate exam and consideration of postvoid residual ultrasound  - Lab order for prostate level and urinalysis    Orders:    [facility] - Jalene Mullet, MD, Urology, [facility] [address])    Urinalysis with Reflex to Culture; Future

## 2023-05-25 NOTE — Progress Notes (Signed)
 Family Medicine Follow-Up Progress Note   [facility]    Patient Patrick Welch  10/05/1945, 78 y.o., male  Encounter Date 05/25/23    ASSESSMENT AND PLAN:     Assessment & Plan      Assessment & Plan  Current mild episode of major depressive disorder, unspecified whether recurrent  : Initial improvement with sertraline, now lacks motivation.  - Increase sertraline to 50 mg    Orders:    sertraline (ZOLOFT) 50 MG tablet; Take 1 tablet by mouth daily    Other specified anxiety disorders  : Initial improvement with sertraline, now lacks motivation.  - Increase sertraline to 50 mg    Orders:    sertraline (ZOLOFT) 50 MG tablet; Take 1 tablet by mouth daily    Essential hypertension   Chronic, at goal (stable), continue current treatment plan, medication adherence emphasized, and lifestyle modifications recommended       Postnasal drip  Persistent.  - ENT evaluation next week  - Continue saline rinses       Prostate cancer screening    Overdue for screening    Orders:    PSA Screening; Future    Benign prostatic hyperplasia with incomplete bladder emptying   Reports nocturnal frequency and incomplete bladder emptying. Stopped tamsulosin this week.  - Referral to [facility] for evaluation, including potential prostate exam and consideration of postvoid residual ultrasound  - Lab order for prostate level and urinalysis    Orders:    [facility] - Jalene Mullet, MD, Urology, [facility])    Urinalysis with Reflex to Culture; Future    Stage 3a chronic kidney disease (HCC)    Unclear control, has been stable, will recheck today    Orders:    Basic Metabolic Panel; Future    Type 2 diabetes mellitus with hyperglycemia, without long-term current use of insulin (HCC)    Had increased slightly at prior appt. Reinforced carb control.     Orders:    Hemoglobin A1C; Future      Orders Placed This Encounter    Basic Metabolic Panel     Standing Status:   Future     Standing Expiration Date:    05/24/2024    Hemoglobin A1C     Standing Status:   Future     Standing Expiration Date:   05/24/2024    PSA Screening     Standing Status:   Future     Standing Expiration Date:   05/24/2024    Urinalysis with Reflex to Culture     Standing Status:   Future     Standing Expiration Date:   05/24/2024     Order Specific Question:   SPECIFY(EX-CATH,MIDSTREAM,CYSTO,ETC)?     Answer:   midstream    [facility]FL - Jalene Mullet, MD, Urology, [facility] [address])     Referral Priority:   Routine     Referral Type:   Eval and Treat     Referral Reason:   Specialty Services Required     Referred to Provider:   Blenda Mounts, MD     Requested Specialty:   Urology     Number of Visits Requested:   1    sertraline (ZOLOFT) 50 MG tablet     Sig: Take 1 tablet by mouth daily     Dispense:  90 tablet     Refill:  0        Follow-up and Dispositions    Return  in about 3 months (around 08/22/2023) for Hypertension, Depression/Anxiety.          Return in about 3 months (around 08/22/2023) for Hypertension, Depression/Anxiety.    Patient Instructions   Diabetes:  Blood sugar goals:  Hemoglobin A1c under 7  Fasting blood sugar 80-120  Blood sugar 2 hours after a meal under 180, 4 hours after a meal under 120  No hypoglycemia (sugars under 70 and symptomatic low sugars)     Blood sugar control with diet and exercise:  Exercise 45 minutes per day.  This makes your insulin work better.  It also allows insulin levels to fall helping with weight loss.  Every night, try to fast from your evening meal to breakfast (at least 12 hours) without eating anything.  This uses stored energy in your liver and makes insulin work better.  Avoid simples sugars such as table sugar in drinks (sodas, lemonade, sweet tea, wine), desserts, candy.  Also avoid fruit juices and high fructose corn syrup.  Avoid frequent consumption of fruit especially grapes and bananas.  A single serving of fruit daily is all you should have.  Finally, drink less milk which has milk  sugar in it.  Control your starch intake.  Men should have 3-4 carb portions per meal.  Women should have 2-3 carb portions per meal.  A carb serving is the equivalent of a slice of bread.     Control your blood pressure and cholesterol.  These problems are common in diabetes.  AND, don't smoke.  All of these problems contribute to heart disease and stroke risk.     Get a yearly eye exam and flu shot.    Make sure your vaccines are up to date particularly the pneumonia vaccines.     Inspect your feet daily.  Wear comfortable protective shoes and clean socks.  Seek medical care if there are sore, calluses or numbness and burning of your feet.      Subjective:     Chief Complaint   Patient presents with    Hypertension     Patrick Welch is a 78 y.o. male who presents for a follow up on HTN. Patient reports his BP has been around 120-130/70's mmHg.        History of Present Illness  [name] is a 78 y.o. male who presents for postnasal drip, nocturia, balance disturbances, and depression.    Postnasal Drip  - The patient reports persistent postnasal drip refractory to increased hydration and saline nasal irrigation.  - He has a scheduled consultation with an otolaryngologist next week.    Nocturia  - He experiences nocturia occurring every 2-3 hours.  - He has discontinued tamsulosin due to its ineffectiveness.  - He reports a sensation of incomplete bladder emptying.  - A nephrology appointment is scheduled for March 2024 to conduct blood work.  - He denies any recent changes in bowel habits or episodes of urinary retention.    Balance Disturbances  - The patient describes experiencing mild imbalance.  - He attributes this to a predominantly sedentary lifestyle.    Depression  - He was initially prescribed sertraline (Zoloft) for depression, which was effective initially.  - He now reports a lack of motivation to engage in activities.    Supplemental Information: None.    MEDICATIONS  - Sertraline  - Tamsulosin         Reviewed PmHx, RxHx, FmHx, SocHx, AllgHx and updated and dated in the chart.  Current Outpatient Medications:     sertraline (ZOLOFT) 50 MG tablet, Take 1 tablet by mouth daily, Disp: 90 tablet, Rfl: 0    mirtazapine (REMERON) 30 MG tablet, Take 1 tablet by mouth nightly, Disp: 90 tablet, Rfl: 1    valsartan (DIOVAN) 80 MG tablet, Take 1 tablet by mouth daily, Disp: 90 tablet, Rfl: 1    gabapentin (NEURONTIN) 300 MG capsule, Take 1 capsule by mouth 4 times daily., Disp: , Rfl:     rosuvastatin (CRESTOR) 5 MG tablet, Take 1 tablet by mouth daily, Disp: 90 tablet, Rfl: 1    aspirin 81 MG EC tablet, Take 1 tablet by mouth daily, Disp: 90 tablet, Rfl: 1    famotidine (PEPCID) 20 MG tablet, Take 1 tablet by mouth 2 times daily, Disp: 180 tablet, Rfl: 1    metFORMIN (GLUCOPHAGE) 500 MG tablet, Take 1 tablet by mouth 2 times daily (with meals), Disp: 180 tablet, Rfl: 1    amLODIPine (NORVASC) 10 MG tablet, Take 1 tablet by mouth daily, Disp: 90 tablet, Rfl: 1    tamsulosin (FLOMAX) 0.4 MG capsule, Take 1 capsule by mouth daily, Disp: 90 capsule, Rfl: 1    methocarbamol (ROBAXIN) 750 MG tablet, Take 1 tablet by mouth as needed (pain), Disp: , Rfl:     Melatonin 5 MG CAPS, Take 5 capsules by mouth nightly, Disp: , Rfl:      Patient Active Problem List   Diagnosis Code    Stage 3a chronic kidney disease (HCC) N18.31    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    Benign prostatic hyperplasia N40.0    Depressive disorder F32.A    Essential hypertension I10    Gastro-esophageal reflux disease without esophagitis K21.9    Hyperglycemia due to type 2 diabetes mellitus (HCC) E11.65          Review of Systems  Review of Systems - negative except as listed above in the HPI    Objective:     Vitals:    05/25/23 1257   BP: 107/63   Site: Left Upper Arm   Position: Sitting   Cuff Size: Medium Adult   Pulse: 66   Resp: 16   Temp: 97.8 F (36.6 C)   TempSrc: Oral   SpO2: 96%   Weight: 114.3 kg (252 lb)   Height: 1.803 m (5'  11")        Vitals and Nurse Documentation reviewed.     Physical Exam       General: Well developed, in no acute distress. Obese  HEENT: No jaundice. Normocephalic and atraumatic.   Neck: Supple, no stiffness  Heart: Normal rate; no murmurs  Respiratory: Clear bilaterally x 4, no wheezing or rales  Extremities:  No edema, normal cap refill, no cyanosis.  Musculoskeletal: No clubbing, no deformities  Neuro: A&Ox3, speech clear, gait stable, cooperative, no focal neurologic deficits  Psychiatric: Mood and behavior normal  Skin: Skin color is normal. No rashes or lesions. Non diaphoretic, moist.    Results      I have discussed the diagnosis with the patient and the intended plan as seen in the above orders.  The patient understands and agrees with the plan. The patient has received an after-visit summary and questions were answered concerning future plans.     Medication Side Effects and Warnings were discussed with patient  Patient Labs were reviewed and or requested:  Patient Past Records were reviewed and or requested    Please note that  this dictation was completed with Dragon, the computer voice recognition software.  Quite often unanticipated grammatical, syntax, homophones, and other interpretive errors are inadvertently transcribed by the computer software.  Please disregard these errors.  Please excuse any errors that have escaped final proofreading.  Thank you.     The patient (or guardian, if applicable) and other individuals in attendance with the patient were advised that Artificial Intelligence will be utilized during this visit to record and process the conversation to generate a clinical note. The patient (or guardian, if applicable) and other individuals in attendance at the appointment consented to the use of AI, including the recording.      Theodore Demark, APRN - CNP  [facility]  [employer]  [phone]

## 2023-05-25 NOTE — Assessment & Plan Note (Addendum)
 Had increased slightly at prior appt. Reinforced carb control.     Orders:    Hemoglobin A1C; Future

## 2023-05-30 NOTE — Telephone Encounter (Signed)
 Pt called regarding recurring text messages and bills he's receiving for $17.21 paid with his copay of $20 in our office on 05/25/23.  Reviewed account payment information and gave pt the Account ID numbers to contact billing office.  Pt agrees to plan. Ldm       May 24 1245  Professional PaymentCO-PAYMENT (ACCOUNT): -20.00  Guarantor  Patrick Welch  Deposit Date:05/25/2023  Tx source:Staff Collected  Acct ID Service Date Code Description Amount Applied  [id number] 05/25/2023 PR OFFICE/OUTPATIENT ESTABLISHED MOD MDM 30 MIN 324.00 -20.00   1247  Professional PaymentPATIENT PAYMENT (ACCOUNT): -17.21  Guarantor  Patrick Welch  Deposit Date:05/25/2023  Tx source:Staff Collected  Acct ID Service Date Code Description Amount Applied  [id number] 05/04/2023 PR OFFICE/OUTPATIENT ESTABLISHED LOW MDM 20 MIN 219.00 -17.21

## 2023-06-06 ENCOUNTER — Encounter

## 2023-07-06 ENCOUNTER — Inpatient Hospital Stay: Admit: 2023-07-06 | Payer: MEDICARE | Primary: Family Medicine

## 2023-07-06 DIAGNOSIS — R0981 Nasal congestion: Secondary | ICD-10-CM

## 2023-07-06 NOTE — Progress Notes (Signed)
 Patrick Welch  SPEECH PATHOLOGY MODIFIED BARIUM SWALLOW STUDY  Patient: Patrick Welch (78 y.o. male)  Date: 07/06/2023  Referring Provider:  Henreitta Leber    SUBJECTIVE:   Patient c/o coughing with thins 2-3x/week. He also c/o sensation of esophageal residue.  He admits to GERD, and takes H2 blocker.     OBJECTIVE:   Past Medical History:   Past Medical History:   Diagnosis Date    Chronic back pain 2021    Depression 2018    GERD (gastroesophageal reflux disease)     Hypertension     Neuropathy 2022    Type 2 diabetes mellitus (HCC)      Past Surgical History:   Procedure Laterality Date    CATARACT REMOVAL WITH IMPLANT Bilateral     EYE SURGERY  2024    Cataracts-both eyes    HERNIA REPAIR  2013    JOINT REPLACEMENT  2018    Right knee    SHOULDER ARTHROSCOPY W/ SUPERIOR LABRAL ANTERIOR POSTERIOR LESION REPAIR Bilateral 2008    TOTAL KNEE ARTHROPLASTY Right 2018    UMBILICAL HERNIA REPAIR      UPPER GASTROINTESTINAL ENDOSCOPY  2022     Current Dietary Status:  regular, thins  Type of Study: Modified barium swallow  Film Views: Lateral;AP  Radiologist: Agarval  Patient Position: standing      Trial 1: Trial 2:   Consistencies Administered:  (thin, pudding, cracker, barium pill)     How Presented: Self-fed/presented;Spoon;Straw;Successive Swallows  ORAL PHASE:      Bolus Acceptance: No impairment     Bolus Formation/Control: No impairment       Propulsion: No impairment       Oral Residue: None      PHARYNGEAL PHASE:      Timing: No impairment     Penetration: None     Aspiration/Timing: No evidence of aspiration     Pharyngeal Clearance: No residue                   Trial 3: Trial 4:    ESOPHAGEAL PHASE:   Esophageal scroll revealed slow clearance of esophagus with multple waves of clear pill from GEJ. please refer to radiologist's note and image in PACS system)                                                                                   Swallowing Physiology  Decreased Tongue Base  Retraction?: No  Laryngeal Elevation: WFL (within functional limits)  Penetration-Aspiration Scale (PAS): 1 - Material does not enter the airway  Pharyngeal Symmetry: Not assessed  Pharyngeal-Esophageal Segment:  (esophageal scroll revealed slow clearance of esophagus with multple waves of clear pill from GEJ. please refer to radiologist's note and image in PACS system)       Functional Oral Intake Scale (FOIS): 7--Total oral diet with no restrictions    ASSESSMENT :  Based on the objective data described above, the patient presents with normal oral-pharyngeal swallow with no aspiration or pharyngeal residue. He did have some mildly slow clearance of lower esophagus, especially pill. Agree with GI consult for EGD.   He reports that his father had  esophageal Cancer.      PLAN/RECOMMENDATIONS :  Continue diet as tolerated.   Recommend GI consult to determine if EGD needed, based on familial history of esophageal cancer.      COMMUNICATION/EDUCATION:   The above findings and recommendations were discussed with:  patient  who verbalized understanding.    Thank you for this referral.  Crist Infante, SLP  Minutes: 20

## 2023-08-07 ENCOUNTER — Encounter

## 2023-08-07 MED ORDER — LANCET DEVICE MISC
Freq: Once | 3 refills | Status: AC
Start: 2023-08-07 — End: 2023-11-08

## 2023-08-07 NOTE — Addendum Note (Signed)
 Addended by: Jill Motes on: 08/07/2023 08:16 PM     Modules accepted: Orders

## 2023-08-08 MED ORDER — DULOXETINE HCL 30 MG PO CPEP
30 | ORAL_CAPSULE | Freq: Every day | ORAL | 1 refills | 90.00000 days | Status: DC
Start: 2023-08-08 — End: 2023-08-30

## 2023-08-09 NOTE — Addendum Note (Signed)
 Addended by: Jill Motes on: 08/09/2023 08:00 AM     Modules accepted: Orders

## 2023-08-17 ENCOUNTER — Encounter

## 2023-08-17 MED ORDER — METFORMIN HCL 500 MG PO TABS
500 | ORAL_TABLET | Freq: Two times a day (BID) | ORAL | 1 refills | 90.00000 days | Status: DC
Start: 2023-08-17 — End: 2024-04-09

## 2023-08-17 NOTE — Telephone Encounter (Signed)
 [facility] faxed request  [place] [facility]    METFORMIN  HCL 500 MG TABLET  Qty 180    Take 1 tablet by mouth twice a day with meals

## 2023-08-22 ENCOUNTER — Encounter: Payer: Medicare (Managed Care) | Attending: Family Medicine | Primary: Family Medicine

## 2023-08-23 ENCOUNTER — Ambulatory Visit
Admit: 2023-08-23 | Discharge: 2023-08-23 | Payer: Medicare (Managed Care) | Attending: Family Medicine | Primary: Family Medicine

## 2023-08-23 VITALS — BP 111/67 | HR 76 | Temp 97.50000°F | Ht 71.0 in | Wt 238.2 lb

## 2023-08-23 DIAGNOSIS — F320 Major depressive disorder, single episode, mild: Secondary | ICD-10-CM

## 2023-08-23 NOTE — Patient Instructions (Signed)
 Today we discussed:  Checking labs to follow up on the hot flashes and recheck your sugar.  Giving you a referral back to Dr. Arleen Bells for a new neuropsych assessment.    Thank you and great to see you today!   Please reach out on MyChart with any questions.   Jill Motes, APRN - CNP

## 2023-08-23 NOTE — Assessment & Plan Note (Addendum)
 Chronic.  - Nocturia, getting up 3 times at night to urinate, difficulty initially. On silodosin, no significant improvement over tamsulosin . Prostate exam indicated enlargement.  - Include prostate level check in lab work.  - May want to switch Urologists.     Orders:    Urinalysis with Reflex to Culture; Future

## 2023-08-23 NOTE — Progress Notes (Signed)
 Patrick Welch is a 78 y.o. male     Chief Complaint   Patient presents with    3 Month Follow-Up    Hypertension    Depression    Anxiety     Vitals:    08/23/23 1057   BP: 111/67   BP Site: Left Upper Arm   Patient Position: Sitting   Pulse: 76   Temp: 97.5 F (36.4 C)   SpO2: 99%   Weight: 108 kg (238 lb 3.2 oz)   Height: 1.803 m (5\' 11" )         Health Maintenance Due   Topic Date Due    Diabetic retinal exam  Never done    Pneumococcal 50+ years Vaccine (2 of 2 - PCV) 12/26/2009    Respiratory Syncytial Virus (RSV) Pregnant or age 20 yrs+ (1 - 1-dose 75+ series) Never done    Diabetic foot exam  11/18/2022         "Have you been to the ER, urgent care clinic since your last visit?  Hospitalized since your last visit?"    NO    "Have you seen or consulted any other health care providers outside of [facility] since your last visit?"    NO

## 2023-08-23 NOTE — Assessment & Plan Note (Addendum)
 Chronic, at goal (stable), continue current treatment plan, medication adherence emphasized, and lifestyle modifications recommended

## 2023-08-23 NOTE — Assessment & Plan Note (Addendum)
 Monitored by specialist- no acute findings meriting change in the plan

## 2023-08-23 NOTE — Progress Notes (Signed)
 Family Medicine Follow-Up Progress Note   [facility]    Patient Patrick Welch  03/12/1946, 78 y.o., male  Encounter Date 08/23/23    ASSESSMENT AND PLAN:       Assessment & Plan  Current mild episode of major depressive disorder, unspecified whether recurrent  Unstable  - Persistent depression, lack of energy and motivation. On Cymbalta  for 2 weeks, no side effects.  - Continue Cymbalta  for 4-6 weeks; may increase dosage if no improvement.  Orders:    External Referral To Neuropsychology    Hot flash in male    Chronic, worsening.  - Occur 3-4 times daily, lasting 15-20 minutes, with profuse sweating. Predates switch to silodosin. Testosterone levels previously normal, B12 levels elevated last year.  - Order labs for hormone markers, thyroid function, and B12 levels.  - No cardiac or respiratory symptoms  - Physical exam without abnormal findings  Orders:    Testosterone, free, total; Future    FSH & LH; Future    Prolactin; Future    Estradiol; Future    Testosterone and SHBG; Future    DHEA-Sulfate; Future    Thyroid Cascade Profile; Future    PSA Screening; Future    Cortisol AM, Total; Future    Vitamin B12 & Folate; Future    Urinalysis with Reflex to Culture; Future    Essential hypertension   Chronic, at goal (stable), continue current treatment plan, medication adherence emphasized, and lifestyle modifications recommended         Controlled type 2 diabetes mellitus with hyperglycemia, without long-term current use of insulin (HCC)    Due for recheck ; reinforced carb control    Orders:    Lipid Panel; Future    Albumin/Creatinine Ratio, Urine; Future    Hemoglobin A1C; Future    CBC; Future    Comprehensive Metabolic Panel; Future    Stage 3a chronic kidney disease (HCC)   Monitored by specialist- no acute findings meriting change in the plan         Idiopathic peripheral neuropathy   [facility] by [facility], awaiting insurance approval for [employer].     Orders:    Testosterone,  free, total; Future    FSH & LH; Future    Prolactin; Future    Estradiol; Future    Testosterone and SHBG; Future    DHEA-Sulfate; Future    Thyroid Cascade Profile; Future    PSA Screening; Future    Cortisol AM, Total; Future    Vitamin B12 & Folate; Future    Benign prostatic hyperplasia with incomplete bladder emptying   Chronic.  - Nocturia, getting up 3 times at night to urinate, difficulty initially. On silodosin, no significant improvement over tamsulosin . Prostate exam indicated enlargement.  - Include prostate level check in lab work.  - May want to switch Urologists.     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  - Persistent forgetfulness. Previous evaluation in May 2024 indicated mild memory changes possibly related to depression and anxiety.  - Referral for further memory screening with Dr. Arleen Bells.    Orders:    External Referral To Neuropsychology    Testosterone, free, total; Future    FSH & LH; Future    Prolactin; Future    Estradiol; Future    Testosterone and SHBG; Future    DHEA-Sulfate; Future    Thyroid  Cascade Profile; Future    PSA Screening; Future    Cortisol AM, Total; Future    Vitamin B12 & Folate; Future    Postnasal drip  Status post ENT  - Scratchiness and occasional irritation when swallowing. Swallow study normal.       Vitamin D deficiency  Orders:    Vitamin D 25 Hydroxy; Future    Encounter for screening for malignant neoplasm of prostate  Needs recheck in setting of symptoms and prior treatment for prostatitis by Urology. Declines referral back to urology currently.     Orders:    PSA Screening; Future    Other specified anxiety disorders  Unstable  - Persistent depression, lack of energy and motivation. On Cymbalta  for 2 weeks, no side effects.  - Continue Cymbalta  for 4-6 weeks; may increase dosage if no improvement.    Orders:    Testosterone, free, total;  Future    FSH & LH; Future    Prolactin; Future    Estradiol; Future    Testosterone and SHBG; Future    DHEA-Sulfate; Future    Thyroid Cascade Profile; Future    PSA Screening; Future    Cortisol AM, Total; Future    Vitamin B12 & Folate; Future    Class 1 obesity due to excess calories with serious comorbidity and body mass index (BMI) of 33.0 to 33.9 in adult  - Down approx 15 lbs since March  - Significant weight loss attributed to decreased food volume intake. Consider depression as contributor, however add'l labs ordered in setting of hot flash symptom  - Update lab work to monitor sugar levels.             Follow-up and Dispositions    Return in about 4 weeks (around 09/20/2023) for virtual visit or in person, follow up after new medicine.          Return in about 4 weeks (around 09/20/2023) for virtual visit or in person, follow up after new medicine.    Patient Instructions   Today we discussed:  Checking labs to follow up on the hot flashes and recheck your sugar.  Giving you a referral back to Dr. Arleen Bells for a new neuropsych assessment.    Thank you and great to see you today!   Please reach out on MyChart with any questions.   Jill Motes, APRN - CNP      Subjective:     Chief Complaint   Patient presents with    3 Month Follow-Up    Hypertension    Depression    Anxiety       History of Present Illness  Patrick Welch is a 78 y.o. male who presents for follow up on MDD after starting cymbalta  two weeks ago, CKD, DIABETES MELLITUS TYPE 2, HYPERTENSION, vasomotor symptoms, urinary dysfunction.     Vasomotor Symptoms  - The patient reports experiencing hot flashes 3-4 times daily, each lasting 15-20 minutes and accompanied by significant diaphoresis.  - Previous testosterone levels were within normal limits; the patient requests reevaluation of testosterone and thyroid function.  - The patient is currently taking B complex and vitamin D2 weekly.    Cognitive Impairment  - The patient describes persistent  forgetfulness and has not yet consulted a neuropsychologist.  - A referral to Dr. Arleen Bells for memory testing was completed 08/09/2022, with recommendations for retesting in the spring.    Depressive Symptoms  - The patient discontinued sertraline  (Zoloft ) and initiated duloxetine  (Cymbalta ) two weeks ago,  with no reported adverse effects.  - Depressive symptoms persist, characterized by anhedonia and lack of motivation.  - The patient's daily routine includes waking at 4:30 AM, experiencing a severe hot flash during breakfast, and napping for 1-1.5 hours.  - The patient attempts outdoor activities but frequently lacks motivation.    Urinary Dysfunction  - The patient transitioned from tamsulosin  to silodosin for urinary symptoms.  - Diagnosed with prostatitis by urology, the patient was prescribed antibiotics and silodosin.  - The patient experiences nocturia, waking three times nightly to urinate, with initial difficulty but improved urinary flow towards morning.  - The patient reports an enlarged prostate.    Dysphagia  - A recent swallow study was normal; however, the patient continues to experience a scratchy sensation and occasional globus sensation, which are manageable.    Supplemental information: The patient initiated probiotic therapy and has noted increased gastroesophageal reflux symptoms. Bowel movements have improved compared to previous constipation.    The patient reports significant weight loss attributed to reduced meal volume. The largest meal is lunch, with the only snack being an ice cream sandwich in the mid-evening. The patient is not actively trying to lose weight but has reduced food intake and avoids junk food.    The patient denies any respiratory changes such as coughing, chest pressure, or pain. The patient is under the care of a nephrologist at [facility] and reports no kidney issues.        Reviewed PmHx, RxHx, FmHx, SocHx, AllgHx and updated and dated in the chart.      Current  Outpatient Medications:     silodosin (RAPAFLO) 8 MG CAPS, Take 1 capsule by mouth daily, Disp: , Rfl:     vitamin D (ERGOCALCIFEROL) 1.25 MG (50000 UT) CAPS capsule, Take 1 capsule by mouth once a week, Disp: , Rfl:     metFORMIN  (GLUCOPHAGE ) 500 MG tablet, Take 1 tablet by mouth 2 times daily (with meals), Disp: 180 tablet, Rfl: 1    DULoxetine  (CYMBALTA ) 30 MG extended release capsule, Take 1 capsule by mouth daily, Disp: 90 capsule, Rfl: 1    valsartan  (DIOVAN ) 80 MG tablet, Take 1 tablet by mouth daily, Disp: 90 tablet, Rfl: 1    rosuvastatin  (CRESTOR ) 5 MG tablet, Take 1 tablet by mouth daily, Disp: 90 tablet, Rfl: 1    aspirin  81 MG EC tablet, Take 1 tablet by mouth daily, Disp: 90 tablet, Rfl: 1    famotidine  (PEPCID ) 20 MG tablet, Take 1 tablet by mouth 2 times daily, Disp: 180 tablet, Rfl: 1    amLODIPine  (NORVASC ) 10 MG tablet, Take 1 tablet by mouth daily, Disp: 90 tablet, Rfl: 1    methocarbamol (ROBAXIN) 750 MG tablet, Take 1 tablet by mouth as needed (pain), Disp: , Rfl:     Lancet Device MISC, 1 Device by Does not apply route once for 1 dose One Touch Delica Plus, Disp: 100 each, Rfl: 3     Patient Active Problem List   Diagnosis Code    Stage 3a chronic kidney disease (HCC) N18.31    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    Benign prostatic hyperplasia N40.0    Depressive disorder F32.A    Essential hypertension I10    Gastro-esophageal reflux disease without esophagitis K21.9    Hyperglycemia due to type 2 diabetes mellitus (HCC) E11.65          Review of Systems  Review of Systems - negative except as listed above in the  HPI    Objective:     Vitals:    08/23/23 1057   BP: 111/67   BP Site: Left Upper Arm   Patient Position: Sitting   Pulse: 76   Temp: 97.5 F (36.4 C)   SpO2: 99%   Weight: 108 kg (238 lb 3.2 oz)   Height: 1.803 m (5\' 11" )          Vitals and Nurse Documentation reviewed.     Physical Exam       General: Well developed, in no acute distress. Obese  HEENT: No jaundice.  Normocephalic and atraumatic.   Neck: Supple, no stiffness  Heart: Normal rate; no murmurs  Respiratory: Clear bilaterally x 4, no wheezing or rales  Extremities:  No edema, normal cap refill, no cyanosis.  Musculoskeletal: No clubbing, no deformities  Neuro: A&Ox3, speech clear, gait stable, cooperative, no focal neurologic deficits  Psychiatric: Mood and behavior normal  Skin: Skin color is normal. No rashes or lesions. Non diaphoretic, moist.    Results  - Labs:    - Testosterone levels: Normal    - B12 levels: High (last year)    I have discussed the diagnosis with the patient and the intended plan as seen in the above orders.  The patient understands and agrees with the plan. The patient has received an after-visit summary and questions were answered concerning future plans.     Medication Side Effects and Warnings were discussed with patient  Patient Labs were reviewed and or requested:  Patient Past Records were reviewed and or requested    Please note that this dictation was completed with Dragon, the computer voice recognition software.  Quite often unanticipated grammatical, syntax, homophones, and other interpretive errors are inadvertently transcribed by the computer software.  Please disregard these errors.  Please excuse any errors that have escaped final proofreading.  Thank you.     The patient (or guardian, if applicable) and other individuals in attendance with the patient were advised that Artificial Intelligence will be utilized during this visit to record and process the conversation to generate a clinical note. The patient (or guardian, if applicable) and other individuals in attendance at the appointment consented to the use of AI, including the recording.      Jill Motes, APRN - CNP  [facility]  [employer]  [phone]

## 2023-08-25 ENCOUNTER — Ambulatory Visit: Payer: Medicare (Managed Care) | Attending: Family Medicine | Primary: Family Medicine

## 2023-08-26 ENCOUNTER — Encounter

## 2023-08-29 NOTE — Telephone Encounter (Signed)
 Looks like consult placed to: [facility]  [address] [place]  [address]  [place] [address]    Pt continues to have concerns over memory and lack of support.

## 2023-08-30 MED ORDER — DULOXETINE HCL 60 MG PO CPEP
60 | ORAL_CAPSULE | Freq: Every day | ORAL | 0 refills | 90.00000 days | Status: DC
Start: 2023-08-30 — End: 2023-09-25

## 2023-09-05 LAB — COMPREHENSIVE METABOLIC PANEL
ALT: 16 IU/L (ref 0–44)
AST: 20 IU/L (ref 0–40)
Albumin: 4.3 g/dL (ref 3.8–4.8)
Alkaline Phosphatase: 91 IU/L (ref 44–121)
BUN/Creatinine Ratio: 10 (ref 10–24)
BUN: 12 mg/dL (ref 8–27)
CO2: 23 mmol/L (ref 20–29)
Calcium: 9.3 mg/dL (ref 8.6–10.2)
Chloride: 99 mmol/L (ref 96–106)
Creatinine: 1.2 mg/dL (ref 0.76–1.27)
Est, Glom Filt Rate: 63 mL/min/{1.73_m2} (ref >59)
Globulin, Total: 1.8 g/dL (ref 1.5–4.5)
Glucose: 126 mg/dL — ABNORMAL HIGH (ref 70–99)
Potassium: 4.2 mmol/L (ref 3.5–5.2)
Sodium: 136 mmol/L (ref 134–144)
Total Bilirubin: 0.5 mg/dL (ref 0.0–1.2)
Total Protein: 6.1 g/dL (ref 6.0–8.5)

## 2023-09-05 LAB — URINALYSIS WITH REFLEX TO CULTURE
Bilirubin, Urine: NEGATIVE
Blood, Urine: NEGATIVE
Glucose, Ur: NEGATIVE
Ketones, Urine: NEGATIVE
Leukocyte Esterase, Urine: NEGATIVE
Nitrite, Urine: NEGATIVE
Protein, UA: NEGATIVE
Specific Gravity, UA: 1.01 (ref 1.005–1.030)
Urobilinogen, Urine: 0.2 mg/dL (ref 0.2–1.0)
pH, Urine: 6.5 (ref 5.0–7.5)

## 2023-09-05 LAB — FSH & LH
FSH: 12 m[IU]/mL (ref 1.5–12.4)
LH: 6.4 m[IU]/mL (ref 1.7–8.6)

## 2023-09-05 LAB — CBC WITH AUTO DIFFERENTIAL
Basophils %: 1 %
Basophils Absolute: 0 10*3/uL (ref 0.0–0.2)
Eosinophils %: 2 %
Eosinophils Absolute: 0.1 10*3/uL (ref 0.0–0.4)
Hematocrit: 44.5 % (ref 37.5–51.0)
Hemoglobin: 14.7 g/dL (ref 13.0–17.7)
Immature Grans (Abs): 0 10*3/uL (ref 0.0–0.1)
Immature Granulocytes %: 0 %
Lymphocytes %: 31 %
Lymphocytes Absolute: 2.5 10*3/uL (ref 0.7–3.1)
MCH: 32.7 pg (ref 26.6–33.0)
MCHC: 33 g/dL (ref 31.5–35.7)
MCV: 99 fL — ABNORMAL HIGH (ref 79–97)
Monocytes %: 9 %
Monocytes Absolute: 0.8 10*3/uL (ref 0.1–0.9)
Neutrophils %: 56 %
Neutrophils Absolute: 4.7 10*3/uL (ref 1.4–7.0)
Platelets: 194 10*3/uL (ref 150–450)
RBC: 4.5 x10E6/uL (ref 4.14–5.80)
RDW: 12.7 % (ref 11.6–15.4)
WBC: 8.2 10*3/uL (ref 3.4–10.8)

## 2023-09-05 LAB — MICROSCOPIC EXAMINATION
Bacteria, UA: NONE SEEN
Casts UA: NONE SEEN /LPF
Epithelial Cells, UA: NONE SEEN /HPF (ref 0–10)
RBC, UA: NONE SEEN /HPF (ref 0–2)
WBC, UA: NONE SEEN /HPF (ref 0–5)

## 2023-09-05 LAB — TESTOSTERONE, FREE, TOTAL
Testosterone, Free: 5.3 pg/mL — ABNORMAL LOW (ref 6.6–18.1)
Testosterone: 328 ng/dL (ref 264–916)

## 2023-09-05 LAB — LIPID PANEL
Cholesterol, Total: 112 mg/dL (ref 100–199)
HDL: 45 mg/dL (ref >39)
LDL Cholesterol: 38 mg/dL (ref 0–99)
Triglycerides: 176 mg/dL — ABNORMAL HIGH (ref 0–149)
VLDL Cholesterol Calculated: 29 mg/dL (ref 5–40)

## 2023-09-05 LAB — HEMOGLOBIN A1C: Hemoglobin A1C: 6.2 % — ABNORMAL HIGH (ref 4.8–5.6)

## 2023-09-05 LAB — DIABETES PATIENT EDUCATION

## 2023-09-05 LAB — ALBUMIN/CREATININE RATIO, URINE
Albumin Urine: <3 ug/mL
Albumin/Creatinine Ratio: <5 mg/g{creat} (ref 0–29)
Creatinine, Ur: 55.1 mg/dL

## 2023-09-05 LAB — THYROID CASCADE PROFILE: TSH: 1.27 u[IU]/mL (ref 0.450–4.500)

## 2023-09-05 LAB — PROLACTIN: Prolactin: 12.8 ng/mL (ref 3.6–25.2)

## 2023-09-05 LAB — ESTRADIOL: Estradiol: 21.8 pg/mL (ref 7.6–42.6)

## 2023-09-05 LAB — PSA, DIAGNOSTIC: PSA: 0.7 ng/mL (ref 0.0–4.0)

## 2023-09-05 LAB — VITAMIN B12 & FOLATE
Folate: 8.1 ng/mL (ref >3.0)
Vitamin B-12: 198 pg/mL — ABNORMAL LOW (ref 232–1245)

## 2023-09-05 LAB — SEX HORMONE BINDING GLOBULIN: Sex Hormone Binding: 27.8 nmol/L (ref 19.3–76.4)

## 2023-09-05 LAB — CORTISOL AM, TOTAL: Cortisol - AM: 12.9 ug/dL (ref 6.2–19.4)

## 2023-09-05 LAB — CARDIOVASCULAR REPORT

## 2023-09-05 LAB — DHEA-SULFATE: DHEAS (DHEA Sulfate): 47 ug/dL (ref 20.8–226.4)

## 2023-09-05 LAB — VITAMIN D 25 HYDROXY: Vit D, 25-Hydroxy: 39.5 ng/mL (ref 30.0–100.0)

## 2023-09-25 ENCOUNTER — Encounter

## 2023-09-25 MED ORDER — DULOXETINE HCL 60 MG PO CPEP
60 | ORAL_CAPSULE | Freq: Every day | ORAL | 0 refills | 90.00000 days | Status: DC
Start: 2023-09-25 — End: 2023-09-27

## 2023-09-25 NOTE — Telephone Encounter (Signed)
 Faxed refill request:     Duloxetine  HCL DR 60mg  cap  Qty: 90     [id number]

## 2023-09-25 NOTE — Addendum Note (Signed)
 Addended by: Tyneisha Hegeman on: 09/25/2023 11:43 AM     Modules accepted: Orders

## 2023-09-26 ENCOUNTER — Ambulatory Visit
Admit: 2023-09-26 | Discharge: 2023-09-26 | Payer: Medicare (Managed Care) | Attending: Family Medicine | Primary: Family Medicine

## 2023-09-26 VITALS — BP 121/69 | HR 71 | Temp 97.10000°F | Resp 20 | Ht 71.0 in | Wt 237.0 lb

## 2023-09-26 DIAGNOSIS — F320 Major depressive disorder, single episode, mild: Secondary | ICD-10-CM

## 2023-09-26 MED ORDER — CYANOCOBALAMIN 1000 MCG PO TABS
1000 | ORAL_TABLET | Freq: Every day | ORAL | 3 refills | 30.00000 days | Status: DC
Start: 2023-09-26 — End: 2023-12-22

## 2023-09-26 MED ORDER — DONEPEZIL HCL 5 MG PO TABS
5 | ORAL_TABLET | Freq: Every evening | ORAL | 1 refills | 30.00000 days | Status: DC
Start: 2023-09-26 — End: 2024-03-22

## 2023-09-26 NOTE — Progress Notes (Signed)
 Family Medicine Follow-Up Progress Note   [facility]    Patient Patrick Welch  1945-07-25, 78 y.o., male  Encounter Date 09/22/23    ASSESSMENT AND PLAN:           Assessment & Plan  Current mild episode of major depressive disorder, unspecified whether recurrent   Chronic, not at goal (unstable), continue current treatment plan, medication adherence emphasized, and lifestyle modifications recommended  - Duloxetine  increased to 60 mg a month ago, no significant difference.  - Continue current dosage of Cymbalta  for a few more weeks.  - Adjust dosage if no improvement.  - Monitor mood and pain related to arthritis.       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  However patient believes symptoms are ongoing and worsening.  - Prescription for donepezil as a trial provided, once daily at night.  - B12 supplementation expected to help with memory.  - Reinforced need to get back in with Dr. Wendall office for repeat testing  Orders:    donepezil (ARICEPT) 5 MG tablet; Take 1 tablet by mouth nightly    Hot flash in male   Ongoing, no change  - Occur around mealtimes and late evening.  - Blood tests have been stable so far  - Stable blood sugar levels at 6.2.  - Order stress test and EKG today to rule out cardiac origin as patient also with fatigue and occasional chest tightness  -Given his risk factors and workup so far, I have concerns this could be an anginal symptom.  Orders:    Exercise stress test; Future    EKG 12 Lead    B12 deficiency   Patient has been off B12 supplement, restarted approximately 1 month ago.  - Encourage continued compliance with medication  - Discussed impact of  medication on mood and memory  Orders:    cyanocobalamin ([facility] VITAMIN B12) 1000 MCG tablet; Take 1 tablet by mouth daily    Essential hypertension   Chronic, at goal (stable), continue current treatment plan, medication adherence emphasized, and lifestyle modifications recommended    Orders:    Exercise stress test; Future    EKG 12 Lead    Controlled type 2 diabetes mellitus with hyperglycemia, without long-term current use of insulin (HCC)   Reviewed A1c, has been stable, continue current regimen    Orders:    Exercise stress test; Future    EKG 12 Lead    Fatigue, unspecified type   As noted above    Orders:    Exercise stress test; Future    EKG 12 Lead    Coronary artery disease due to lipid rich plaque    Orders:    Exercise stress test; Future    EKG 12 Lead    Chest pain, unspecified type  Orders:    Exercise stress test; Future    EKG 12 Lead               No follow-ups on file.    There are no Patient Instructions on file for this visit.    Subjective:     No chief complaint on file.      History of Present Illness  Patrick Welch is a 78 y.o. male who presents for follow up  on vitamin B12 deficiency, cognitive impairment, vasomotor symptoms, and mood disturbances.    Vasomotor symptoms  - Experiencing hot flashes accompanied by hyperhidrosis  - Symptoms predominantly occurring in the morning and late evening  - No identifiable triggers  - Denies experiencing palpitations, vertigo, or angina  - Uncertain about the timing of his last electrocardiogram (EKG)  - Underwent a cardiac stress test [place]  - Has had issues with heartburn over the last few months  - Some atypical chest discomfort  - Has risk factors for cardiac disease    Vitamin B12 deficiency  - Has not reviewed recent laboratory results  - Previously discontinued a B complex supplement  - Resumed B complex supplement one month ago following the prescription of high-dose cholecalciferol (vitamin D3)    Mood  disturbances  - Dosage of duloxetine  increased to 60 mg approximately 3-4 weeks ago  - No observed improvement in mood or analgesia  - Continues to experience significant spinal pain    Cognitive impairment  - Has not scheduled an appointment with Dr. Lester, a specialist in memory disorders  - Reports persistent cognitive impairment  - Uncertain whether he has previously trialed donepezil    Supplemental information: None        Reviewed PmHx, RxHx, FmHx, SocHx, AllgHx and updated and dated in the chart.      Current Outpatient Medications:     DULoxetine  (CYMBALTA ) 60 MG extended release capsule, Take 1 capsule by mouth daily, Disp: 30 capsule, Rfl: 0    silodosin (RAPAFLO) 8 MG CAPS, Take 1 capsule by mouth daily, Disp: , Rfl:     vitamin D (ERGOCALCIFEROL) 1.25 MG (50000 UT) CAPS capsule, Take 1 capsule by mouth once a week, Disp: , Rfl:     metFORMIN  (GLUCOPHAGE ) 500 MG tablet, Take 1 tablet by mouth 2 times daily (with meals), Disp: 180 tablet, Rfl: 1    Lancet Device MISC, 1 Device by Does not apply route once for 1 dose One Touch Delica Plus, Disp: 100 each, Rfl: 3    valsartan  (DIOVAN ) 80 MG tablet, Take 1 tablet by mouth daily, Disp: 90 tablet, Rfl: 1    rosuvastatin  (CRESTOR ) 5 MG tablet, Take 1 tablet by mouth daily, Disp: 90 tablet, Rfl: 1    aspirin  81 MG EC tablet, Take 1 tablet by mouth daily, Disp: 90 tablet, Rfl: 1    famotidine  (PEPCID ) 20 MG tablet, Take 1 tablet by mouth 2 times daily, Disp: 180 tablet, Rfl: 1    amLODIPine  (NORVASC ) 10 MG tablet, Take 1 tablet by mouth daily, Disp: 90 tablet, Rfl: 1    methocarbamol (ROBAXIN) 750 MG tablet, Take 1 tablet by mouth as needed (pain), Disp: , Rfl:      Patient Active Problem List   Diagnosis Code    Stage 3a chronic kidney disease (HCC) N18.31    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    Benign prostatic hyperplasia N40.0    Depressive disorder F32.A    Essential hypertension I10    Gastro-esophageal reflux disease without esophagitis K21.9     Hyperglycemia due to type 2 diabetes mellitus (HCC) E11.65          Review of Systems  Review of Systems - negative except as listed above in the HPI    Objective:     There were no vitals filed for this visit.         Vitals and Nurse Documentation reviewed.  Physical Exam       General: Well developed, in no acute distress. Obese  HEENT: No jaundice. Normocephalic and atraumatic.   Neck: Supple, no stiffness  Heart: Normal rate; no murmurs  Respiratory: Clear bilaterally x 4, no wheezing or rales  Extremities:  No edema, normal cap refill, no cyanosis.  Musculoskeletal: No clubbing, no deformities  Neuro: A&Ox3, speech clear, gait stable, cooperative, no focal neurologic deficits  Psychiatric: Mood and behavior normal  Skin: Skin color is normal. No rashes or lesions. Non diaphoretic, moist.    Results  - Labs:    - Testosterone levels: Normal    - B12 levels: High (last year)      - Labs:    - B12: 198    - Blood sugar: 6.2    - LFT: Normal    - Cholesterol: Improved    - Kidney function: Good    - CBC: Normal    - PSA: Good    - Vitamin D: Low normal    - Thyroid function: Normal    - Testosterone: Okay    - Hormones: Normal    I have discussed the diagnosis with the patient and the intended plan as seen in the above orders.  The patient understands and agrees with the plan. The patient has received an after-visit summary and questions were answered concerning future plans.     Medication Side Effects and Warnings were discussed with patient  Patient Labs were reviewed and or requested:  Patient Past Records were reviewed and or requested    Please note that this dictation was completed with Dragon, the computer voice recognition software.  Quite often unanticipated grammatical, syntax, homophones, and other interpretive errors are inadvertently transcribed by the computer software.  Please disregard these errors.  Please excuse any errors that have escaped final proofreading.  Thank you.     The patient (or  guardian, if applicable) and other individuals in attendance with the patient were advised that Artificial Intelligence will be utilized during this visit to record and process the conversation to generate a clinical note. The patient (or guardian, if applicable) and other individuals in attendance at the appointment consented to the use of AI, including the recording.      [name], APRN - CNP  [facility]  [employer]  [phone]

## 2023-09-26 NOTE — Other (Signed)
 These labs are overall pretty good/stable. Your B12 is pretty low and last year it was quite high. Had you been taking a supplement and stopped? Low b12 can contribute to mood and memory issues.   Thank you!  [name], APRN - CNP

## 2023-09-26 NOTE — Patient Instructions (Addendum)
 Today we discussed:  We are going to check a stress test since your endocrine panels were mostly normal  Sending b12  due to low levels  I'm adding donepezil for your memory issues. Please take this at bedtime and make your follow up with Dr. Lester.    Thank you and great to see you today!   Please reach out on MyChart with any questions.   [name], APRN - CNP

## 2023-09-26 NOTE — Assessment & Plan Note (Addendum)
 Chronic, at goal (stable), continue current treatment plan, medication adherence emphasized, and lifestyle modifications recommended    Orders:    Exercise stress test; Future    EKG 12 Lead

## 2023-09-26 NOTE — Progress Notes (Signed)
 Chief Complaint   Patient presents with    Medication Management     Cymbalta  pt has double medication, but no improvement     Results     Discuss lab results      Have you been to the ER, urgent care clinic since your last visit?  Hospitalized since your last visit?    NO    "Have you seen or consulted any other health care providers outside of [facility] since your last visit?"    NO          Click Here for Release of Records Request

## 2023-09-27 ENCOUNTER — Encounter

## 2023-09-27 MED ORDER — DULOXETINE HCL 60 MG PO CPEP
60 | ORAL_CAPSULE | Freq: Every day | ORAL | 0 refills | 90.00000 days | Status: DC
Start: 2023-09-27 — End: 2023-12-12

## 2023-09-27 NOTE — Telephone Encounter (Signed)
 [facility] # [phone] faxed 90 day refill request:    Duloxetine  HCL DR 60 mg Cap  Sig: Take 1 capsule by mouth every day  Quantity: 90   Ldm

## 2023-10-03 ENCOUNTER — Ambulatory Visit: Payer: Medicare (Managed Care) | Primary: Family Medicine

## 2023-10-05 ENCOUNTER — Inpatient Hospital Stay: Admit: 2023-10-05 | Payer: Medicare (Managed Care) | Primary: Family Medicine

## 2023-10-05 VITALS — BP 148/86 | HR 70 | Resp 16 | Ht 71.0 in | Wt 237.0 lb

## 2023-10-05 DIAGNOSIS — I2510 Atherosclerotic heart disease of native coronary artery without angina pectoris: Principal | ICD-10-CM

## 2023-10-05 DIAGNOSIS — R232 Flushing: Principal | ICD-10-CM

## 2023-10-05 LAB — STRESS TEST ONLY EXERCISE
Angina Index: 0
Baseline Diastolic BP: 86 mmHg
Baseline HR: 72 {beats}/min
Baseline Systolic BP: 148 mmHg
Body Surface Area: 2.32 m2
Exercise Duration Seconds: 56 s
Exercise Duration Time: 6 min
Stress Diastolic BP: 90 mmHg
Stress Estimated Workload: 9.8 METS
Stress Peak HR: 144 {beats}/min
Stress Percent HR Achieved: 99 %
Stress Rate Pressure Product: 33120 BPM*mmHg
Stress Systolic BP: 230 mmHg
Stress Target HR: 145 {beats}/min

## 2023-10-09 ENCOUNTER — Encounter

## 2023-10-09 NOTE — Other (Signed)
 Hi Patrick Welch, thanks for getting this done. Your stress test showed several types of irregular heartbeats, including a short run of fast beats from the bottom chambers of the heart. While some of these can happen in healthy people, this pattern-especially during exercise-means we should look more closely at your heart to be sure there's no underlying problem.  I am going to enter a referral to Cardiology now - [phone]  Thank you!  [name], APRN - CNP

## 2023-10-11 ENCOUNTER — Encounter

## 2023-10-11 MED ORDER — FAMOTIDINE 20 MG PO TABS
20 | ORAL_TABLET | Freq: Two times a day (BID) | ORAL | 1 refills | 30.00000 days | Status: DC
Start: 2023-10-11 — End: 2024-04-22

## 2023-10-11 NOTE — Telephone Encounter (Signed)
 [facility]  [place] [address]    FAMOTIDINE  20 MG TABLET  Take 1 tablet by mouth twice a day

## 2023-10-22 ENCOUNTER — Encounter

## 2023-10-23 ENCOUNTER — Encounter

## 2023-10-23 MED ORDER — AMLODIPINE BESYLATE 10 MG PO TABS
10 | ORAL_TABLET | Freq: Every day | ORAL | 1 refills | 90.00000 days | Status: DC
Start: 2023-10-23 — End: 2024-04-24

## 2023-10-24 MED ORDER — MIRTAZAPINE 30 MG PO TABS
30 | ORAL_TABLET | Freq: Every evening | ORAL | 0 refills | 90.00000 days | Status: DC
Start: 2023-10-24 — End: 2023-11-08

## 2023-10-25 ENCOUNTER — Encounter

## 2023-10-25 MED ORDER — ASPIRIN 81 MG PO TBEC
81 | ORAL_TABLET | Freq: Every day | ORAL | 1 refills | Status: AC
Start: 2023-10-25 — End: ?

## 2023-10-25 NOTE — Telephone Encounter (Signed)
 [facility] request  [place] [facility]    ASPIRIN  EC 81 MG TABLET  Take 1 tablet by mouth every day

## 2023-10-30 ENCOUNTER — Encounter

## 2023-10-31 MED ORDER — ROSUVASTATIN CALCIUM 5 MG PO TABS
5 | ORAL_TABLET | Freq: Every day | ORAL | 1 refills | 90.00000 days | Status: DC
Start: 2023-10-31 — End: 2024-04-26

## 2023-11-08 ENCOUNTER — Ambulatory Visit
Admit: 2023-11-08 | Discharge: 2023-11-08 | Payer: Medicare (Managed Care) | Attending: Family Medicine | Primary: Family Medicine

## 2023-11-08 VITALS — BP 100/61 | HR 88 | Temp 98.20000°F | Resp 16 | Ht 71.0 in | Wt 235.6 lb

## 2023-11-08 DIAGNOSIS — F320 Major depressive disorder, single episode, mild: Principal | ICD-10-CM

## 2023-11-08 MED ORDER — BUPROPION HCL 75 MG PO TABS
75 | ORAL_TABLET | Freq: Every morning | ORAL | 1 refills | 90.00000 days | Status: AC
Start: 2023-11-08 — End: ?

## 2023-11-08 NOTE — Progress Notes (Signed)
 Chief Complaint   Patient presents with    Depression     Patrick Welch is a 78 y.o. male who presents for a follow up on depressive disorder and increased Duloxetine  dose.      Have you been to the ER, urgent care clinic since your last visit?  Hospitalized since your last visit?    NO    "Have you seen or consulted any other health care providers outside of [facility] since your last visit?"    NO          Click Here for Release of Records Request

## 2023-11-08 NOTE — Assessment & Plan Note (Signed)
-   Improved blood pressure readings today.  - Regular monitoring maintained.  - No changes in diet or routine.  - Continue current management plan.

## 2023-11-08 NOTE — Patient Instructions (Signed)
 Today we discussed:  Adding a very low dose of wellbutrin  to try to give you a little boost with the cymbalta  and your mood.  Please continue your counseling!    Thank you and great to see you today!   Please reach out on MyChart with any questions.   [name], APRN - CNP

## 2023-11-08 NOTE — Progress Notes (Signed)
 Family Medicine Follow-Up Progress Note   [facility]    Patient Patrick Welch  15-Oct-1945, 78 y.o., male  Encounter Date 11/08/23    ASSESSMENT AND PLAN:             Assessment & Plan  Current mild episode of major depressive disorder, unspecified whether recurrent    Chronic , not at goal  - Predominantly depression-based symptoms based on neuropsych evaluation  - Cymbalta  regimen not significantly beneficial.  - Initiated weekly counseling sessions.  - Adding low dose Wellbutrin  to morning routine to enhance Cymbalta  effects. Prescription sent to pharmacy.  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  - Continue taking medication labeled memory every morning.         Abnormal stress test  Referred to cardiology and has appointment at the end of September  - Stress test revealed irregular heartbeats.  - Referral to cardiology made.  - Appointment with Dr. Elenor scheduled for 01/01/2024 at 1:40 PM.       Chronic bilateral low back pain without sciatica   - Persistent back pain, plans appointment with [facility] for potential injections.  - Follow-up with specialist for further management.  - Foot pain attributed to lack of exercise.  - Therapy may help with pain management.         Essential hypertension   - Improved blood pressure readings today.  - Regular monitoring maintained.  - No changes in diet or routine.  - Continue current management plan.               Follow-up and Dispositions    Return in about 4 weeks (around 12/06/2023) for follow up after new medicine, virtual visit or in person.             Return in about 4 weeks (around 12/06/2023) for follow up after new medicine, virtual visit or in person.    Patient Instructions   Today we discussed:  Adding a very low dose of wellbutrin  to try to give you a little boost with the cymbalta  and your mood.  Please continue your counseling!    Thank you and great to see you today!   Please reach out on MyChart with any questions.   [name], APRN - CNP     Subjective:     Chief Complaint   Patient presents with    Depression     Patrick Welch is a 78 y.o. male who presents for a follow up on depressive disorder and increased Duloxetine  dose.        History of Present Illness  Patrick Welch is a 78 y.o. male who presents for follow up major depressive disorder, dorsalgia, hypertension, arrhythmia, and cognitive impairment.    Major Depressive Disorder  - The patient has been referred to [facility] for psychotherapy and commenced sessions yesterday, with weekly appointments scheduled.  - The dosage of Cymbalta  has been increased without significant amelioration of depressive symptoms.  - The patient did not resume mirtazapine  after initially taking 1-2 doses.  - He utilizes a ceiling fan at high  speed to facilitate sleep.  - The patient is open to initiating bupropion  therapy and expresses a preference for non-pharmacological interventions over medication adjustments.  - He is contemplating returning to employment at [employer].  - His daily activities are limited, primarily involving taking his cat to the veterinarian three times a week.    Dorsalgia and Podalgia  - The patient reports dorsalgia and podalgia, which he attributes to a lack of physical activity.  - He plans to schedule an appointment with [facility] for potential spinal injections, a treatment he has previously undergone.    Hypertension  - He monitors his blood pressure at home, which remains stable without dietary modifications.    Arrhythmia  - The patient has an upcoming  cardiology appointment on 01/01/2024 at 1:40 PM with Dr. Elenor.    Cognitive Impairment  - He continues to experience forgetfulness and difficulty recalling directions while driving.  - He is uncertain if he is taking donepezil  but believes he is, as he takes a medication labeled memory every morning.    Occupation: Previously employed at [employer]  Sleep: Utilizes a ceiling fan at high speed to sleep comfortably    PAST SURGICAL HISTORY:  Spinal injections (date unspecified)        Reviewed PmHx, RxHx, FmHx, SocHx, AllgHx and updated and dated in the chart.      Current Outpatient Medications:     buPROPion  (WELLBUTRIN ) 75 MG tablet, Take 1 tablet by mouth every morning, Disp: 30 tablet, Rfl: 1    rosuvastatin  (CRESTOR ) 5 MG tablet, Take 1 tablet by mouth daily, Disp: 90 tablet, Rfl: 1    aspirin  81 MG EC tablet, Take 1 tablet by mouth daily, Disp: 90 tablet, Rfl: 1    amLODIPine  (NORVASC ) 10 MG tablet, Take 1 tablet by mouth daily, Disp: 90 tablet, Rfl: 1    famotidine  (PEPCID ) 20 MG tablet, Take 1 tablet by mouth 2 times daily, Disp: 180 tablet, Rfl: 1    DULoxetine  (CYMBALTA ) 60 MG extended release capsule, Take 1 capsule by mouth daily, Disp: 90 capsule, Rfl: 0    cyanocobalamin  ([employer] VITAMIN B12) 1000 MCG tablet, Take 1 tablet by mouth daily, Disp: 30 tablet, Rfl: 3    silodosin (RAPAFLO) 8 MG CAPS, Take 1 capsule by mouth daily, Disp: , Rfl:     vitamin D (ERGOCALCIFEROL) 1.25 MG (50000 UT) CAPS capsule, Take 1 capsule by mouth once a week, Disp: , Rfl:     metFORMIN  (GLUCOPHAGE ) 500 MG tablet, Take 1 tablet by mouth 2 times daily (with meals), Disp: 180 tablet, Rfl: 1    Lancet Device MISC, 1 Device by Does not apply route once for 1 dose One Touch Delica Plus, Disp: 100 each, Rfl: 3    valsartan  (DIOVAN ) 80 MG tablet, Take 1 tablet by mouth daily, Disp: 90 tablet, Rfl: 1    methocarbamol (ROBAXIN) 750 MG tablet, Take 1 tablet by mouth as needed (pain), Disp: , Rfl:     donepezil  (ARICEPT ) 5 MG tablet,  Take 1 tablet by mouth nightly (Patient not taking: Reported on 11/08/2023), Disp: 90 tablet, Rfl: 1     Patient Active Problem List   Diagnosis Code    Stage 3a chronic kidney disease (HCC) N18.31    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    Benign prostatic hyperplasia N40.0    Depressive disorder F32.A    Essential hypertension I10    Gastro-esophageal reflux disease without esophagitis K21.9  Hyperglycemia due to type 2 diabetes mellitus (HCC) E11.65          Review of Systems  Review of Systems - negative except as listed above in the HPI    Objective:     Vitals:    11/08/23 0953   BP: 100/61   BP Site: Left Upper Arm   Patient Position: Sitting   BP Cuff Size: Medium Adult   Pulse: 88   Resp: 16   Temp: 98.2 F (36.8 C)   TempSrc: Temporal   SpO2: 95%   Weight: 106.9 kg (235 lb 9.6 oz)   Height: 1.803 m (5' 11)            Vitals and Nurse Documentation reviewed.     Physical Exam  General Appearance: Normal.  Vital signs: Within normal limits.  HEENT: Within normal limits.  Respiratory: Clear to auscultation, no wheezing, rales or rhonchi.  CV: extra beat noted irregularly, no murmur  Skin: Warm and dry, no rash.  Neurological: Normal.         Results  - Diagnostic Testing:    - Stress test: Irregular heartbeats    I have discussed the diagnosis with the patient and the intended plan as seen in the above orders.  The patient understands and agrees with the plan. The patient has received an after-visit summary and questions were answered concerning future plans.     Medication Side Effects and Warnings were discussed with patient  Patient Labs were reviewed and or requested:  Patient Past Records were reviewed and or requested    Please note that this dictation was completed with Dragon, the computer voice recognition software.  Quite often unanticipated grammatical, syntax, homophones, and other interpretive errors are inadvertently transcribed by the computer software.  Please disregard these errors.   Please excuse any errors that have escaped final proofreading.  Thank you.     The patient (or guardian, if applicable) and other individuals in attendance with the patient were advised that Artificial Intelligence will be utilized during this visit to record and process the conversation to generate a clinical note. The patient (or guardian, if applicable) and other individuals in attendance at the appointment consented to the use of AI, including the recording.      [name], APRN - CNP  [facility]  [employer]  [phone]

## 2023-11-16 NOTE — Telephone Encounter (Signed)
 Pts wife is calling, she is concerned stating that he is acting very strange since taking bupropion . Sluggish and mummy like, hot flashes, just not himself. The next appointment is 11/22/23 but she wants to know if he should stop taking medication until he can be seen.     Rock [phone]

## 2023-11-17 ENCOUNTER — Telehealth
Admit: 2023-11-17 | Discharge: 2023-11-17 | Payer: Medicare (Managed Care) | Attending: Family Medicine | Primary: Family Medicine

## 2023-11-17 DIAGNOSIS — F331 Major depressive disorder, recurrent, moderate: Principal | ICD-10-CM

## 2023-11-17 NOTE — Progress Notes (Signed)
 Family Medicine Follow-Up Progress Note  [facility]    Patient Patrick Welch  April 12, 1945, 78 y.o., male  Encounter Date 11/17/23    Patrick Welch, was evaluated through a synchronous (real-time) audio-video encounter. The patient (or guardian if applicable) is aware that this is a billable service, which includes applicable co-pays. This Virtual Visit was conducted with patient's (and/or legal guardian's) consent. Patient identification was verified, and a caregiver was present when appropriate.   The patient was located at Home: [address]  [place] [address]  Provider was located at Facility (Appt Dept): [address] Patrick Welch  [address]  [place],  [place] [address]  Confirm you are appropriately licensed, registered, or certified to deliver care in the state where the patient is located as indicated above. If you are not or unsure, please re-schedule the visit: Yes, I confirm.     Patrick Welch (DOB: Oct 04, 1945) is a Established patient, presenting virtually for evaluation of the following:    Assessment & Plan    Below is the assessment and plan developed based on review of pertinent history, physical exam, labs, studies, and medications.    Assessment & Plan  Major depressive disorder, recurrent episode, moderate (HCC)   On Cymbalta  and recently started Wellbutrin ; depressive symptoms persist.   - Wife concerned that he is more forgetful with this medication and seeing other changes in him  - STOP wellbutrin  for now  - Continue biweekly therapy sessions.  - Consider social activities like volunteering or part-time work to alleviate depression.  - Discuss couples counseling benefits.       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  - Discontinue Wellbutrin  temporarily to assess contribution to symptoms.         Hot flash in male  - Stress  test showed rhythm changes; follow-up with Dr. Elenor next month.  - Seek immediate medical attention if symptoms worsen, particularly nausea, flushing, or unusual feelings.         Return if symptoms worsen or fail to improve.    Plan discussed, questions answered.  Medications and side effects reviewed, Labs and Records reviewed and/or requested    Patient Instructions   Today we discussed:  STOP wellbutrin   Continue counseling. Consider separate couples counseling to keep open conversations going and find a good compromise.  See you back in 2 weeks!    Thank you and great to see you today!   Please reach out on MyChart with any questions.   Patrick Rank, APRN - CNP       Subjective      History of Present Illness  The patient presents for VV to discuss cognitive impairment, vasomotor symptoms, and depressive disorder.    Cognitive Impairment  - Increased forgetfulness  - Communication difficulties since initiating Wellbutrin  therapy two weeks ago    Vasomotor Symptoms  - Daily episodes of flushing  - Necessitating frequent changes of clothing  - Has had stress test showing some rhythm changes  - no acute chest pain or pressure currently    Depressive Disorder  - Anhedonia  - Symptoms of anxiety, chest discomfort, nausea, and gastrointestinal distress resulting in anorexia and hypersomnia two days ago  - Current medication regimen includes Wellbutrin  and Cymbalta , administered in the morning    His wife attributes his symptoms to major depressive disorder, potentially exacerbated by prolonged separation from their  grandchildren and son for six years. The patient attends counseling sessions biweekly and has contemplated obtaining part-time employment, although his wife expresses reservations. He does not participate in physical activities within the household.       As per HPI, otherwise comprehensive ROS unremarkable.     Current Outpatient Medications:     mirtazapine  (REMERON ) 30 MG tablet, Take 1 tablet by  mouth nightly, Disp: , Rfl:     rosuvastatin  (CRESTOR ) 5 MG tablet, Take 1 tablet by mouth daily, Disp: 90 tablet, Rfl: 1    aspirin  81 MG EC tablet, Take 1 tablet by mouth daily, Disp: 90 tablet, Rfl: 1    amLODIPine  (NORVASC ) 10 MG tablet, Take 1 tablet by mouth daily, Disp: 90 tablet, Rfl: 1    famotidine  (PEPCID ) 20 MG tablet, Take 1 tablet by mouth 2 times daily, Disp: 180 tablet, Rfl: 1    DULoxetine  (CYMBALTA ) 60 MG extended release capsule, Take 1 capsule by mouth daily, Disp: 90 capsule, Rfl: 0    donepezil  (ARICEPT ) 5 MG tablet, Take 1 tablet by mouth nightly, Disp: 90 tablet, Rfl: 1    cyanocobalamin  ([employer]S VITAMIN B12) 1000 MCG tablet, Take 1 tablet by mouth daily, Disp: 30 tablet, Rfl: 3    silodosin (RAPAFLO) 8 MG CAPS, Take 1 capsule by mouth daily, Disp: , Rfl:     vitamin D (ERGOCALCIFEROL) 1.25 MG (50000 UT) CAPS capsule, Take 1 capsule by mouth once a week, Disp: , Rfl:     metFORMIN  (GLUCOPHAGE ) 500 MG tablet, Take 1 tablet by mouth 2 times daily (with meals), Disp: 180 tablet, Rfl: 1    valsartan  (DIOVAN ) 80 MG tablet, Take 1 tablet by mouth daily, Disp: 90 tablet, Rfl: 1    methocarbamol (ROBAXIN) 750 MG tablet, Take 1 tablet by mouth as needed (pain), Disp: , Rfl:     Lancet Device MISC, 1 Device by Does not apply route once for 1 dose One Touch Delica Plus (Patient not taking: Reported on 11/17/2023), Disp: 100 each, Rfl: 3     PHYSICAL EXAMINATION:  No data recorded     Constitutional: [x]  Appears well-developed and well-nourished [x]  No apparent distress      []  Abnormal -     Mental status: [x]  Alert and awake  [x]  Oriented to person/place/time [x]  Able to follow commands    []  Abnormal -     Eyes:   EOM    [x]   Normal    []  Abnormal -   Sclera  [x]   Normal    []  Abnormal -          Discharge [x]   None visible   []  Abnormal -     HENT: [x]  Normocephalic, atraumatic  []  Abnormal -   [x]  Mouth/Throat: Mucous membranes are moist    External Ears [x]  Normal  []  Abnormal -    Neck: [x]  No  visualized mass []  Abnormal -     Pulmonary/Chest: [x]  Respiratory effort normal   [x]  No visualized signs of difficulty breathing or respiratory distress        []  Abnormal -      Musculoskeletal:   [x]  Normal gait with no signs of ataxia         [x]  Normal range of motion of neck        []  Abnormal -     Neurological:        [x]  No Facial Asymmetry (Cranial nerve 7 motor function) (limited exam due to video visit)          [  x] No gaze palsy        []  Abnormal -          Skin:        [x]  No significant exanthematous lesions or discoloration noted on facial skin         []  Abnormal -            Psychiatric:       [x]  Normal Affect []  Abnormal -        [x]  No Hallucinations    Other pertinent observable physical exam findings:-    The patient (or guardian, if applicable) and other individuals in attendance with the patient were advised that Artificial Intelligence will be utilized during this visit to record, process the conversation to generate a clinical note, and support improvement of the AI technology. The patient (or guardian, if applicable) and other individuals in attendance at the appointment consented to the use of AI, including the recording.      On this date 11/17/23 I have spent 31 minutes reviewing previous notes, test results and face to face (virtual) with the patient discussing the diagnosis and importance of compliance with the treatment plan as well as documenting on the day of the visit.    --Patrick Rank, APRN - CNP

## 2023-11-17 NOTE — Progress Notes (Signed)
 Chief Complaint   Patient presents with    Depression     Patrick Welch is a 78 y.o. male who presents for a follow up on depression and possible side effects to Duloxetine .      Have you been to the ER, urgent care clinic since your last visit?  Hospitalized since your last visit?    NO    "Have you seen or consulted any other health care providers outside of [facility] since your last visit?"    NO          Click Here for Release of Records Request

## 2023-11-17 NOTE — Patient Instructions (Addendum)
 Today we discussed:  STOP wellbutrin   Continue counseling. Consider separate couples counseling to keep open conversations going and find a good compromise.  See you back in 2 weeks!    Thank you and great to see you today!   Please reach out on MyChart with any questions.   [name], APRN - CNP

## 2023-12-01 MED ORDER — VALSARTAN 80 MG PO TABS
80 | ORAL_TABLET | Freq: Every day | ORAL | 3 refills | 90.00000 days | Status: DC
Start: 2023-12-01 — End: 2024-02-22

## 2023-12-02 ENCOUNTER — Encounter

## 2023-12-05 ENCOUNTER — Telehealth

## 2023-12-05 MED ORDER — COVID-19MRNA BIVAL VAC MODERNA 50 MCG/0.5ML IM SUSP
50 | Freq: Once | INTRAMUSCULAR | 0 refills | Status: AC
Start: 2023-12-05 — End: 2023-12-05

## 2023-12-05 NOTE — Telephone Encounter (Signed)
 Pt called asking that Rx sent for Moderna Covid vaccine be changed to pfizer which he received before, stating he was advised to contact PCP to change. Ldm

## 2023-12-06 MED ORDER — COMIRNATY 30 MCG/0.3ML IM SUSY
30 | Freq: Once | INTRAMUSCULAR | 0 refills | Status: AC
Start: 2023-12-06 — End: 2023-12-06

## 2023-12-06 NOTE — Telephone Encounter (Signed)
 Please review and advise if pended Rx is correct     Patrick Welch

## 2023-12-12 ENCOUNTER — Ambulatory Visit
Admit: 2023-12-12 | Discharge: 2023-12-12 | Payer: Medicare (Managed Care) | Attending: Family Medicine | Primary: Family Medicine

## 2023-12-12 VITALS — BP 104/65 | HR 78 | Temp 98.20000°F | Resp 16 | Ht 71.0 in | Wt 236.5 lb

## 2023-12-12 DIAGNOSIS — F331 Major depressive disorder, recurrent, moderate: Principal | ICD-10-CM

## 2023-12-12 MED ORDER — SERTRALINE HCL 25 MG PO TABS
25 | ORAL_TABLET | Freq: Every day | ORAL | 0 refills | 90.00000 days | Status: DC
Start: 2023-12-12 — End: 2024-01-08

## 2023-12-12 MED ORDER — DULOXETINE HCL 30 MG PO CPEP
30 | ORAL_CAPSULE | Freq: Every day | ORAL | 0 refills | 90.00000 days | Status: DC
Start: 2023-12-12 — End: 2024-01-12

## 2023-12-12 NOTE — Progress Notes (Signed)
 Family Medicine Follow-Up Progress Note  [facility]    Patient Patrick Welch  08-20-1945, 78 y.o., male  Encounter Date 12/12/23    Assessment & Plan :       Assessment & Plan  Major depressive disorder, recurrent episode, moderate (HCC)  Currently on Cymbalta  60 mg, no significant improvement.  - Reduce Cymbalta  to 30 mg for 2 weeks, then switch to Zoloft  25 mg.  - Prescription for Cymbalta  30 mg if needed.    Orders:    DULoxetine  (CYMBALTA ) 30 MG extended release capsule; Take 1 capsule by mouth daily STOP 60 mg and start 30 mg for 2 weeks    sertraline  (ZOLOFT ) 25 MG tablet; Take 1 tablet by mouth daily Start after finishing two weeks of cymbalta  30 mg.    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 Psychologist believes symptoms are mood-related, not fully dementia.  - Continue therapy sessions.         Essential hypertension   Chronic, at goal (stable), continue current treatment plan, medication adherence emphasized, and lifestyle modifications recommended         Hot flash in male  Significant sweating.  - Cardiology appointment on 01/01/2024 to assess potential cardiac causes.  - Labs without significant findings         Type 2 diabetes mellitus with hyperglycemia, without long-term current use of insulin (HCC)   Chronic, at goal (stable), continue current treatment plan         Stage 3a chronic kidney disease (HCC)   Chronic, at goal (stable), continue current treatment plan  - Seeing nephrologist this week       B12 deficiency   Will recheck at follow-up  - Recheck B12 levels to assess need for potential B12 injections if levels remain low.  - Currently taking B12 supplements.         Chronic bilateral low back pain without sciatica   Monitored by specialist- no acute findings meriting change in the plan         Class 1 obesity due to excess calories with serious comorbidity and body mass index (BMI)  of 32.0 to 32.9 in adult  The patient is asked to make an attempt to improve diet and exercise patterns to aid in medical management of this problem.           Benign prostatic hyperplasia with incomplete bladder emptying  Pt requests a refill of their medication, which has been sent.                No follow-ups on file.    Diagnoses and plan discussed; questions answered; AVS provided.  Medication Side Effects and Warnings discussed, Labs reviewed and/or requested, Records reviewed and/or requested    There are no Patient Instructions on file for this visit.    Subjective :     History of Present Illness  The patient presents with cognitive impairment, major depressive disorder, vasomotor symptoms, dorsalgia, and vitamin B12 deficiency.    Cognitive Impairment  - He reports experiencing memory deficits.  - His wife frequently reminds him of his forgetfulness, contributing to his stress levels.    Major Depressive Disorder  - Depressive symptoms are currently managed by a psychologist.  - He attends counseling sessions multiple times per month.  - The patient has discontinued bupropion  and is now prescribed duloxetine  60 mg alone.  - He is uncertain if duloxetine  has provided relief  for his arthritic symptoms or mood.  - He is not taking mirtazapine  at night.  - There have been no significant changes in his energy levels since discontinuing bupropion .  - He has ceased regular physical activity due to his depression.  - He is contemplating returning to work but is hesitant due to his wife's disapproval.  - His therapist does not emphasize increasing physical activity.  - He has never taken sertraline .    Vasomotor Symptoms  - The patient experiences vasomotor symptoms, including hot flashes and hyperhidrosis.  - These symptoms necessitate frequent changes of clothing, sometimes up to three times daily.  - These symptoms have been present since adolescence.  - There have been no significant changes in his vasomotor  symptoms since discontinuing bupropion .    Dorsalgia  - He reports no significant generalized pain, except for discomfort localized to the back.  - [facility]    Vitamin B12 Deficiency  - Despite taking vitamin B12 supplements, he has not noticed an improvement in his energy levels.    - A nephrologist recommended blood work, which was reportedly satisfactory.  - He has an upcoming nephrology appointment on 12/13/2023.  - He reports no gastrointestinal disturbances.        Current Outpatient Medications   Medication Sig Dispense Refill    tamsulosin  (FLOMAX ) 0.4 MG capsule Take 1 capsule by mouth daily      valsartan  (DIOVAN ) 80 MG tablet Take 1 tablet by mouth daily 90 tablet 3    rosuvastatin  (CRESTOR ) 5 MG tablet Take 1 tablet by mouth daily 90 tablet 1    aspirin  81 MG EC tablet Take 1 tablet by mouth daily 90 tablet 1    amLODIPine  (NORVASC ) 10 MG tablet Take 1 tablet by mouth daily 90 tablet 1    famotidine  (PEPCID ) 20 MG tablet Take 1 tablet by mouth 2 times daily 180 tablet 1    DULoxetine  (CYMBALTA ) 60 MG extended release capsule Take 1 capsule by mouth daily 90 capsule 0    donepezil  (ARICEPT ) 5 MG tablet Take 1 tablet by mouth nightly 90 tablet 1    cyanocobalamin  ([employer] VITAMIN B12) 1000 MCG tablet Take 1 tablet by mouth daily 30 tablet 3    vitamin D (ERGOCALCIFEROL) 1.25 MG (50000 UT) CAPS capsule Take 1 capsule by mouth once a week      metFORMIN  (GLUCOPHAGE ) 500 MG tablet Take 1 tablet by mouth 2 times daily (with meals) 180 tablet 1    methocarbamol (ROBAXIN) 750 MG tablet Take 1 tablet by mouth as needed (pain)      mirtazapine  (REMERON ) 30 MG tablet Take 1 tablet by mouth nightly      silodosin (RAPAFLO) 8 MG CAPS Take 1 capsule by mouth daily (Patient not taking: Reported on 12/12/2023)       No current facility-administered medications for this visit.       Review of Systems  Review of Systems - negative except as listed above in the  HPI    Objective :     Vitals:    12/12/23 0928   BP: 104/65   BP Site: Left Upper Arm   Patient Position: Sitting   BP Cuff Size: Medium Adult   Pulse: 78   Resp: 16   Temp: 98.2 F (36.8 C)   TempSrc: Temporal   SpO2: 96%   Weight: 107.3 kg (236 lb 8 oz)   Height: 1.803  m (5' 11)        Vitals and Nurse Documentation reviewed.     Physical Exam  General Appearance: Normal.  Vital signs: Within normal limits.  HEENT: Within normal limits.  Respiratory: Within normal limits.  Cardiovascular: Heart murmur with one skipped beat.  Skin: Warm and dry, no rash.  Neurological: Normal.  Other observations: Blood pressure normal.       Please note that this dictation was completed with Dragon, the computer voice recognition software.  Quite often unanticipated grammatical, syntax, homophones, and other interpretive errors are inadvertently transcribed by the computer software.  Please disregard these errors.  Please excuse any errors that have escaped final proofreading.  Thank you.   The patient (or guardian, if applicable) and other individuals in attendance with the patient were advised that Artificial Intelligence will be utilized during this visit to record and process the conversation to generate a clinical note. The patient (or guardian, if applicable) and other individuals in attendance at the appointment consented to the use of AI, including the recording.      --------------------[name], APRN - CNP

## 2023-12-12 NOTE — Assessment & Plan Note (Addendum)
 Chronic, at goal (stable), continue current treatment plan

## 2023-12-12 NOTE — Patient Instructions (Addendum)
 Today we discussed:  STOP 60 mg cymbalta , start 30 mg for 2 weeks. Then start taking zoloft  25 mg.   Keep working with therapist - get outside once a day!    Thank you and great to see you today!   Please reach out on MyChart with any questions.   [name], APRN - CNP

## 2023-12-12 NOTE — Assessment & Plan Note (Addendum)
 Chronic, at goal (stable), continue current treatment plan, medication adherence emphasized, and lifestyle modifications recommended

## 2023-12-12 NOTE — Assessment & Plan Note (Signed)
Pt requests a refill of their medication, which has been sent.

## 2023-12-12 NOTE — Progress Notes (Signed)
 Chief Complaint   Patient presents with    Depression     Patrick Welch is a 78 y.o. male who presents for a follow up on depression.      Have you been to the ER, urgent care clinic since your last visit?  Hospitalized since your last visit?    NO    "Have you seen or consulted any other health care providers outside of [facility] since your last visit?"    NO          Click Here for Release of Records Request

## 2023-12-12 NOTE — Assessment & Plan Note (Addendum)
 Chronic, at goal (stable), continue current treatment plan  - Seeing nephrologist this week

## 2023-12-14 ENCOUNTER — Encounter

## 2023-12-22 ENCOUNTER — Encounter

## 2023-12-22 MED ORDER — CYANOCOBALAMIN 1000 MCG PO TABS
1000 | ORAL_TABLET | Freq: Every day | ORAL | 2 refills | 30.00000 days | Status: AC
Start: 2023-12-22 — End: ?

## 2023-12-22 NOTE — Telephone Encounter (Signed)
 This was sent today    Patrick Welch

## 2023-12-22 NOTE — Telephone Encounter (Signed)
 Faxed refill request:     Vitamin B-12 1,000mcg tablet  Qty: 30  Refills: 3      [id number]

## 2023-12-24 ENCOUNTER — Inpatient Hospital Stay: Admit: 2023-12-24 | Payer: Medicare (Managed Care) | Primary: Family Medicine

## 2023-12-24 DIAGNOSIS — M47819 Spondylosis without myelopathy or radiculopathy, site unspecified: Secondary | ICD-10-CM

## 2024-01-01 ENCOUNTER — Encounter

## 2024-01-01 ENCOUNTER — Ambulatory Visit
Admit: 2024-01-01 | Discharge: 2024-01-01 | Payer: Medicare (Managed Care) | Attending: Internal Medicine | Primary: Family Medicine

## 2024-01-01 ENCOUNTER — Ambulatory Visit: Admit: 2024-01-01 | Discharge: 2024-01-25 | Payer: Medicare (Managed Care) | Primary: Family Medicine

## 2024-01-01 VITALS — BP 110/60 | HR 66 | Ht 71.0 in | Wt 235.0 lb

## 2024-01-01 DIAGNOSIS — I10 Essential (primary) hypertension: Secondary | ICD-10-CM

## 2024-01-01 DIAGNOSIS — I493 Ventricular premature depolarization: Principal | ICD-10-CM

## 2024-01-01 NOTE — Addendum Note (Signed)
 Addended byBETHA ANTONINA DOMINO on: 01/01/2024 02:52 PM     Modules accepted: Orders

## 2024-01-01 NOTE — Progress Notes (Signed)
 Per Dr. Elenor- echo, exercise nuclear stress test, 7 day holter sent to monitor team.

## 2024-01-01 NOTE — Progress Notes (Signed)
 Chief Complaint   Patient presents with    Hypertension    Coronary Artery Disease    Chest Pain     Vitals:    01/01/24 1336   BP: 110/60   BP Site: Left Upper Arm   Patient Position: Sitting   BP Cuff Size: Medium Adult   Pulse: 66   SpO2: 97%   Weight: 106.6 kg (235 lb)   Height: 1.803 m (5' 11)      BP 110/60 (BP Site: Left Upper Arm, Patient Position: Sitting, BP Cuff Size: Medium Adult)   Pulse 66   Ht 1.803 m (5' 11)   Wt 106.6 kg (235 lb)   SpO2 97%   BMI 32.78 kg/m

## 2024-01-01 NOTE — Progress Notes (Signed)
 Patient: Patrick Welch  DOB: [birth year]    Primary Cardiologist:Dr. CHARM Pyo  EP Cardiologist:NONE   Primary AHF cardiologist:   PCP: Leonia Moats, APRN - CNP    Today's Date: 01/01/2024      ASSESSMENT AND PLAN:     Assessment and Plan:     Abnormal stress test / NSVT / diaphoresis  Possible anginal equivalent  ETT  Normal stress EKG at 99% MPHR  Normal functional capacity  Arrhythmias during stress: Frequent PACs, frequent multifocal PVCs. NSVT, PVC couplet and triplet. Non-specific ST abnormality noted.   Arrhythmias during recovery: occasional PACs, rare PVCs.     Rec'd Exercise nuc stress  Echo  7 day holter    2.  HTN  Controlled  Valsartan  80  Amlodipine  10    3.  HLD  Rosuva 5  Lab Results   Component Value Date    LDL 38 09/04/2023         Follow up with Dr. CHARM Pyo after above testing.       ICD-10-CM    1. Essential hypertension  I10 EKG 12 Lead          HISTORY OF PRESENT ILLNESS:     History of Present Illness:  Patrick Welch is a 78 y.o. male referred for abnormal ETT, sweatiness.    Prior tobacco, quite 2008.    Seeing a psychologist for depression.      Notices sweats and hot flashes , occur 4 times a day, sometimes more often, usually before and during meals, one ot two other times, depending on exertion.      Looks to go get something done, tries to get up and go overcome with sweatiness, sometimes has to change his shirt.      Feels limited, has attributed to depression.        PAST MEDICAL HISTORY:     Past Medical History:   Diagnosis Date    Anxiety     Chronic back pain 2021    Depression 2018    GERD (gastroesophageal reflux disease)     Hypertension 2021    Memory disorder     Neuropathy 2022    Type 2 diabetes mellitus (HCC)         Past Surgical History:   Procedure Laterality Date    CATARACT REMOVAL WITH IMPLANT Bilateral     COLONOSCOPY      EYE SURGERY  2024    Cataracts-both eyes    HERNIA REPAIR  2013    JOINT REPLACEMENT  2018    Right knee    SHOULDER ARTHROSCOPY W/  SUPERIOR LABRAL ANTERIOR POSTERIOR LESION REPAIR Bilateral 2008    TOTAL KNEE ARTHROPLASTY Right 2018    UMBILICAL HERNIA REPAIR      UPPER GASTROINTESTINAL ENDOSCOPY  2022       CURRENT MEDICATIONS:    .  Current Outpatient Medications   Medication Sig Dispense Refill    cyanocobalamin  ([employer] VITAMIN B12) 1000 MCG tablet Take 1 tablet by mouth daily 90 tablet 2    tamsulosin  (FLOMAX ) 0.4 MG capsule Take 1 capsule by mouth daily      sertraline  (ZOLOFT ) 25 MG tablet Take 1 tablet by mouth daily Start after finishing two weeks of cymbalta  30 mg. 30 tablet 0    valsartan  (DIOVAN ) 80 MG tablet Take 1 tablet by mouth daily 90 tablet 3    rosuvastatin  (CRESTOR ) 5 MG tablet Take 1 tablet by mouth daily 90 tablet 1  aspirin  81 MG EC tablet Take 1 tablet by mouth daily 90 tablet 1    amLODIPine  (NORVASC ) 10 MG tablet Take 1 tablet by mouth daily 90 tablet 1    famotidine  (PEPCID ) 20 MG tablet Take 1 tablet by mouth 2 times daily 180 tablet 1    donepezil  (ARICEPT ) 5 MG tablet Take 1 tablet by mouth nightly 90 tablet 1    vitamin D (ERGOCALCIFEROL) 1.25 MG (50000 UT) CAPS capsule Take 1 capsule by mouth once a week      metFORMIN  (GLUCOPHAGE ) 500 MG tablet Take 1 tablet by mouth 2 times daily (with meals) 180 tablet 1    methocarbamol (ROBAXIN) 750 MG tablet Take 1 tablet by mouth as needed (pain)      DULoxetine  (CYMBALTA ) 30 MG extended release capsule Take 1 capsule by mouth daily STOP 60 mg and start 30 mg for 2 weeks (Patient not taking: Reported on 01/01/2024) 14 capsule 0     No current facility-administered medications for this visit.       Allergies   Allergen Reactions    Tramadol Itching     All over    All over      All over All over      All over  All over All over       SOCIAL HISTORY:     Social History     Tobacco Use    Smoking status: Former     Current packs/day: 0.00     Average packs/day: 0.5 packs/day for 15.0 years (7.5 ttl pk-yrs)     Types: Cigarettes     Start date: 04/05/1991     Quit date: 2008      Years since quitting: 17.7    Smokeless tobacco: Never    Tobacco comments:     Quit October 2008   Vaping Use    Vaping status: Never Used   Substance Use Topics    Alcohol use: Yes     Alcohol/week: 1.0 standard drink of alcohol     Types: 1 Cans of beer per week    Drug use: Never       FAMILY HISTORY:     Family History   Problem Relation Age of Onset    Cancer Father         Esophageal       REVIEW OF SYMPTOMS:     Review of Symptoms:  Negative except as above, all other systems reviewed and are negative for a Comprehensive ROS (10+)    PHYSICAL EXAM:     Physical Exam:  BP 110/60 (BP Site: Left Upper Arm, Patient Position: Sitting, BP Cuff Size: Medium Adult)   Pulse 66   Ht 1.803 m (5' 11)   Wt 106.6 kg (235 lb)   SpO2 97%   BMI 32.78 kg/m     General: alert, cooperative, no distress, appears stated age  Neck: supple, symmetrical, trachea midline, no adenopathy, thyroid: not enlarged, symmetric, no tenderness/mass/nodules, no carotid bruit, and no JVD  Lungs: clear to auscultation bilaterally  Heart: regular rate  and regular rhythm, S1, S2 normal, no murmur, no click, rub or gallop  Abdomen: soft, non tender  Extremities: extremities normal, atraumatic, no cyanosis or edema  Skin: No significant rashes  MSKTL: Overall good ROM ext  Neuro: Grossly intact  Psych: Appropriate affect    LABS / OTHER STUDIES:     Lab Results   Component Value Date/Time    NA 136 09/04/2023  08:47 AM    K 4.2 09/04/2023 08:47 AM    CL 99 09/04/2023 08:47 AM    CO2 23 09/04/2023 08:47 AM    BUN 12 09/04/2023 08:47 AM    GLOB 2.7 05/20/2022 10:47 AM    ALT 16 09/04/2023 08:47 AM    AST 20 09/04/2023 08:47 AM       Lab Results   Component Value Date/Time    CHOL 112 09/04/2023 08:47 AM    HDL 45 09/04/2023 08:47 AM    LDL 38 09/04/2023 08:47 AM    LDL 77.4 11/17/2021 10:49 AM    VLDL 29 09/04/2023 08:47 AM       Cholesterol, Total   Date Value Ref Range Status   09/04/2023 112 100 - 199 mg/dL Final     Triglycerides   Date  Value Ref Range Status   09/04/2023 176 (H) 0 - 149 mg/dL Final     HDL   Date Value Ref Range Status   09/04/2023 45 >39 mg/dL Final         CARDIAC DIAGNOSTICS:     Cardiac Evaluation Includes:  I reviewed the test results below.  No results found for this or any previous visit.        10/05/23    STRESS TEST ONLY EXERCISE 10/05/2023  6:51 PM (Final)    Interpretation Summary  Normal stress EKG at 99% MPHR  Normal functional capacity  Arrhythmias during stress: Frequent PACs, frequent multifocal PVCs. NSVT, PVC couplet and triplet. Non-specific ST abnormality noted.  Arrhythmias during recovery: occasional PACs, rare PVCs.    Signed by: Ephriam MARLA Query, MD on 10/05/2023  6:51 PM      No results found for this or any previous visit.       No results found for this or any previous visit.      No specialty comments available.     Future Appointments   Date Time Provider Department Center   01/12/2024  2:20 PM Leonia Moats, APRN - CNP [facility] DEP       Karna CHRISTELLA Pyo, MD    [facility]  [facility]    [address], [address]    [place], [place]   [address]    Ph: [phone]

## 2024-01-01 NOTE — Telephone Encounter (Signed)
 Enrolled with [employer] - Ordered and being shipped to patient's home address on file.  ETA within 5-7 business days.      holter  Received: Today  Antonina Domino, LPN  Patrick Welch; Patrick Welch, Patrick Welch  7 day holter for NSVT, PVC per Dr. Elenor- TY

## 2024-01-08 ENCOUNTER — Encounter

## 2024-01-08 MED ORDER — SERTRALINE HCL 25 MG PO TABS
25 | ORAL_TABLET | Freq: Every day | ORAL | 0 refills | 90.00000 days | Status: DC
Start: 2024-01-08 — End: 2024-02-23

## 2024-01-08 NOTE — Telephone Encounter (Signed)
"  [facility] request  [place]     SERTRALINE  HCL 25 MG TABLET  Take 1 tablet by mouth daily start after finishing two weeks of cymbalta  30 mg      "

## 2024-01-12 ENCOUNTER — Ambulatory Visit
Admit: 2024-01-12 | Discharge: 2024-01-12 | Payer: Medicare (Managed Care) | Attending: Family Medicine | Primary: Family Medicine

## 2024-01-12 VITALS — BP 99/59 | HR 93 | Temp 97.70000°F | Resp 15 | Ht 71.0 in | Wt 233.0 lb

## 2024-01-12 DIAGNOSIS — F331 Major depressive disorder, recurrent, moderate: Principal | ICD-10-CM

## 2024-01-12 NOTE — Progress Notes (Addendum)
 "   Family Medicine Follow-Up Progress Note  [facility]    Patient Patrick Welch  1945/04/23, 78 y.o., male  Encounter Date 02/27/24    Assessment & Plan :       Assessment & Plan  Major depressive disorder, recurrent episode, moderate (HCC)   - Currently taking sertraline , switched from Cymbalta .  - No significant side effects from sertraline  reported.  - Maintain current dose of sertraline .  - Advised cardiac clearance if he should be dx with ADHD prior to med start       Panic  - Morning panic attacks reported.  - On heart monitor for 3 weeks to evaluate cardiac involvement.  - Seeing psychologist for depression and discussing panic attacks.  - Atarax 10 mg used as needed, provides some relief.  - Wearing holter monitor to r/o cardiac contribution       Needs flu shot  - Influenza vaccine to be administered today.  Orders:    Influenza, FLUAD Trivalent, (age 69 y+), IM, Preservative Free, 0.5mL      Follow-up and Dispositions    Return in about 2 months (around 03/13/2024) for Depression/Anxiety, virtual visit or in person.            Return in about 2 months (around 03/13/2024) for Depression/Anxiety, virtual visit or in person.    Diagnoses and plan discussed; questions answered; AVS provided.  Medication Side Effects and Warnings discussed, Labs reviewed and/or requested, Records reviewed and/or requested    Patient Instructions   Today we discussed:  Flu shot today  Stay on current sertraline  dose for now  Awaiting heart monitor results   If you would like me to send atarax, let me know    Thank you and great to see you today!   Please reach out on MyChart with any questions.   [name], APRN - CNP     Subjective :     History of Present Illness  Patrick Welch is a 78 y.o. male who presents for evaluation of episodes of panic disorder, depression.     Panic Disorder  - He continues to experience morning episodes of panic disorder, questions whether an adverse effect of his medication.  -  He utilizes hydroxyzine (Atarax) 10 mg from a previous prescription, which provides some symptomatic relief.  - The patient is under cardiology care for arrhythmias and has been wearing a cardiac monitor at night for 3 weeks to correlate anxiety episodes with cardiac monitor readings.    Major Depressive Disorder  - He is receiving psychological therapy for major depressive disorder, with his psychologist suggesting that his morning panic episodes may be associated with anticipatory anxiety  - The patient is contemplating part-time employment as recommended by his psychologist but is hesitant due to his wife's disapproval.  - Couples counseling has been recommended, but his wife is resistant to the idea.    Medication Change  - He is currently on sertraline , having switched from duloxetine  (Cymbalta ), and is uncertain if this medication change is contributing to his panic episodes. Also was on Wellbutrin  but had word finding difficulty.     - Awaiting neuropsychological testing for attention-deficit/hyperactivity disorder (ADHD), which he believes may be responsible for his forgetfulness.    Recent laboratory work was conducted by his nephrologist.    There is no history of adverse reactions to the influenza vaccine.    Social History:  Marital Status: Married  Living Condition: Resides with his wife  Current Outpatient Medications   Medication Sig Dispense Refill    cyanocobalamin  ([employer] VITAMIN B12) 1000 MCG tablet Take 1 tablet by mouth daily 90 tablet 2    rosuvastatin  (CRESTOR ) 5 MG tablet Take 1 tablet by mouth daily 90 tablet 1    aspirin  81 MG EC tablet Take 1 tablet by mouth daily 90 tablet 1    amLODIPine  (NORVASC ) 10 MG tablet Take 1 tablet by mouth daily 90 tablet 1    famotidine  (PEPCID ) 20 MG tablet Take 1 tablet by mouth 2 times daily 180 tablet 1    donepezil  (ARICEPT ) 5 MG tablet Take 1 tablet by mouth nightly 90 tablet 1    vitamin D (ERGOCALCIFEROL) 1.25 MG (50000 UT) CAPS capsule Take 1  capsule by mouth once a week      metFORMIN  (GLUCOPHAGE ) 500 MG tablet Take 1 tablet by mouth 2 times daily (with meals) 180 tablet 1    methocarbamol (ROBAXIN) 750 MG tablet Take 1 tablet by mouth as needed (pain)      sertraline  (ZOLOFT ) 50 MG tablet Take 1 tablet by mouth daily Start after finishing two weeks of cymbalta  30 mg. 90 tablet 1    valsartan  (DIOVAN ) 80 MG tablet Take 1 tablet by mouth daily 90 tablet 3    tamsulosin  (FLOMAX ) 0.4 MG capsule Take 1 capsule by mouth daily 90 capsule 1     No current facility-administered medications for this visit.       Review of Systems  Review of Systems - negative except as listed above in the HPI    Objective :     Vitals:    01/12/24 1424   BP: (!) 99/59   BP Site: Left Upper Arm   Patient Position: Sitting   BP Cuff Size: Medium Adult   Pulse: 93   Resp: 15   Temp: 97.7 F (36.5 C)   TempSrc: Temporal   SpO2: 95%   Weight: 105.7 kg (233 lb)   Height: 1.803 m (5' 11)          Vitals and Nurse Documentation reviewed.     Physical Exam  General Appearance: Normal.  Vital signs: Within normal limits.  HEENT: Within normal limits.  Respiratory: Within normal limits.  Cardiovascular: heart murmur, RRR  Skin: Warm and dry, no rash.  Neurological: Normal.  Other observations: Blood pressure normal.        Please note that this dictation was completed with Dragon, the computer voice recognition software.  Quite often unanticipated grammatical, syntax, homophones, and other interpretive errors are inadvertently transcribed by the computer software.  Please disregard these errors.  Please excuse any errors that have escaped final proofreading.  Thank you.   The patient (or guardian, if applicable) and other individuals in attendance with the patient were advised that Artificial Intelligence will be utilized during this visit to record and process the conversation to generate a clinical note. The patient (or guardian, if applicable) and other individuals in attendance at  the appointment consented to the use of AI, including the recording.      --------------------[name], APRN - CNP  "

## 2024-01-12 NOTE — Patient Instructions (Addendum)
"  Today we discussed:  Flu shot today  Stay on current sertraline  dose for now  Awaiting heart monitor results   If you would like me to send atarax, let me know    Thank you and great to see you today!   Please reach out on MyChart with any questions.   [name], APRN - CNP   "

## 2024-01-12 NOTE — Progress Notes (Signed)
"  Chief Complaint   Patient presents with    Depression     Patrick Welch is a 78 y.o. male who presents for a follow up on depression. Patient has completely weaned off Duloxetine  and has started taking Sertraline . Patient reports he has been experiencing panic attacks. He is currently on a Holter Monitor ordered by Dr. Elenor.      Have you been to the ER, urgent care clinic since your last visit?  Hospitalized since your last visit?    NO    Have you seen or consulted any other health care providers outside of [facility] since your last visit?    NO          Click Here for Release of Records Request    "

## 2024-01-15 NOTE — Telephone Encounter (Signed)
"  Patient is calling because he was wearing a heart monitor but he has discontinie wearing the device because he has broken out in a red rash and the area is swollen.    Patient stopped wearing the monitor on 01/10/24 and he is sending it back to the company..Patient said he can send some pictures of the area where it was broken out.Patient said he hda stea        [phone] patient  "

## 2024-01-25 ENCOUNTER — Ambulatory Visit: Admit: 2024-01-25 | Discharge: 2024-01-31 | Payer: Medicare (Managed Care) | Primary: Family Medicine

## 2024-01-25 ENCOUNTER — Ambulatory Visit: Admit: 2024-01-25 | Discharge: 2024-01-25 | Payer: Medicare (Managed Care) | Primary: Family Medicine

## 2024-01-25 VITALS — BP 168/88 | HR 78 | Ht 71.0 in | Wt 233.0 lb

## 2024-01-25 VITALS — BP 116/60 | HR 88 | Ht 71.0 in | Wt 235.0 lb

## 2024-01-25 DIAGNOSIS — I10 Essential (primary) hypertension: Principal | ICD-10-CM

## 2024-01-25 LAB — EXTENDED CARDIAC HOLTER MONITOR: Body Surface Area: 2.31 m2

## 2024-01-25 MED ORDER — TECHNETIUM TC 99M SESTAMIBI IV KIT
Freq: Once | INTRAVENOUS | Status: AC | PRN
Start: 2024-01-25 — End: 2024-01-25
  Administered 2024-01-25: 13:00:00 25.2 via INTRAVENOUS

## 2024-01-25 MED ORDER — PERFLUTREN LIPID MICROSPHERE IV SUSP
0.9 | Freq: Once | INTRAVENOUS | Status: AC | PRN
Start: 2024-01-25 — End: 2024-01-25
  Administered 2024-01-25: 13:00:00 10 mL via INTRAVENOUS

## 2024-01-25 NOTE — Result Encounter Note (Signed)
"  Hi Patrick Welch,    Your Holter monitor looks okay showed normal sinus rhythm with occasional extra beats create premature atrial contractions and short runs the longest was 7 beats.      Typically we would not do anything for this unless you are symptomatic-if you feel like palpitations or an issue we could add a low-dose beta-blocker.    Dr. Elenor  "

## 2024-01-26 LAB — ECHO (TTE) COMPLETE (PRN CONTRAST/BUBBLE/STRAIN/3D)
AV Area by Peak Velocity: 2.9 cm2
AV Area by VTI: 2.9 cm2
AV Mean Gradient: 6 mmHg
AV Mean Velocity: 1.2 m/s
AV Peak Gradient: 10 mmHg
AV Peak Velocity: 1.6 m/s
AV VTI: 30.3 cm
AV Velocity Ratio: 0.75
AVA/BSA Peak Velocity: 1.3 cm2/m2
AVA/BSA VTI: 1.3 cm2/m2
Ao Root Index: 1.42 cm/m2
Aortic Root: 3.2 cm
Ascending Aorta Index: 1.51 cm/m2
Ascending Aorta: 3.4 cm
Body Surface Area: 2.3 m2
E/E' Lateral: 6.63
E/E' Ratio (Averaged): 7.62
E/E' Septal: 8.6
EF BP: 62 % (ref 55–100)
EF Physician: 60 %
Est. RA Pressure: 3 mmHg
Fractional Shortening 2D: 32 % (ref 28–44)
IVSd: 0.9 cm (ref 0.6–1.0)
LA Diameter: 4.7 cm
LA Size Index: 2.09 cm/m2
LA Volume A-L A4C: 64 mL — AB (ref 18–58)
LA Volume A-L A4C: 85 mL — AB (ref 18–58)
LA Volume A/L: 76 mL
LA Volume BP: 71 mL — AB (ref 18–58)
LA Volume Index A-L A2C: 38 mL/m2 — AB (ref 16–34)
LA Volume Index A-L A4C: 28 mL/m2 (ref 16–34)
LA Volume Index A/L: 34 mL/m2 (ref 16–34)
LA Volume Index BP: 32 mL/m2 (ref 16–34)
LA Volume Index MOD A2C: 36 mL/m2 — AB (ref 16–34)
LA Volume Index MOD A4C: 26 mL/m2 (ref 16–34)
LA Volume MOD A2C: 81 mL — AB (ref 18–58)
LA Volume MOD A4C: 59 mL — AB (ref 18–58)
LA/AO Root Ratio: 1.47
LV E' Lateral Velocity: 9.95 cm/s
LV E' Septal Velocity: 7.67 cm/s
LV EDV A2C: 123 mL
LV EDV A4C: 133 mL
LV EDV BP: 132 mL (ref 67–155)
LV EDV Index A2C: 55 mL/m2
LV EDV Index A4C: 59 mL/m2
LV EDV Index BP: 59 mL/m2
LV ESV A2C: 46 mL
LV ESV A4C: 47 mL
LV ESV BP: 50 mL (ref 22–58)
LV ESV Index A2C: 20 mL/m2
LV ESV Index A4C: 21 mL/m2
LV ESV Index BP: 22 mL/m2
LV Ejection Fraction A2C: 63 %
LV Ejection Fraction A4C: 65 %
LV Mass 2D Index: 93.1 g/m2 (ref 49–115)
LV Mass 2D: 209.6 g (ref 88–224)
LV RWT Ratio: 0.31
LVIDd Index: 2.62 cm/m2
LVIDd: 5.9 cm (ref 4.2–5.9)
LVIDs Index: 1.78 cm/m2
LVIDs: 4 cm
LVOT Area: 3.8 cm2
LVOT Diameter: 2.2 cm
LVOT Mean Gradient: 3 mmHg
LVOT Peak Gradient: 6 mmHg
LVOT Peak Velocity: 1.2 m/s
LVOT SV: 85.9 mL
LVOT Stroke Volume Index: 38.2 mL/m2
LVOT VTI: 22.6 cm
LVOT:AV VTI Index: 0.75
LVPWd: 0.9 cm (ref 0.6–1.0)
MV A Velocity: 0.91 m/s
MV Area by PHT: 4 cm2
MV Area by VTI: 3.9 cm2
MV E Velocity: 0.66 m/s
MV E Wave Deceleration Time: 188.5 ms
MV E/A: 0.73
MV Max Velocity: 1 m/s
MV Mean Gradient: 1 mmHg
MV Mean Velocity: 0.5 m/s
MV PHT: 54.7 ms
MV Peak Gradient: 4 mmHg
MV VTI: 22.1 cm
MV:LVOT VTI Index: 0.98
RA Area 4C: 19 cm2
RA Volume Index A4C: 24 mL/m2
RA Volume: 53 mL
RV Free Wall Peak S': 12.1 cm/s
RVIDd: 4.2 cm
RVSP: 32 mmHg
TAPSE: 3.5 cm (ref 1.7–?)
TR Max Velocity: 2.7 m/s
TR Peak Gradient: 29 mmHg

## 2024-01-29 ENCOUNTER — Ambulatory Visit: Admit: 2024-01-29 | Discharge: 2024-01-29 | Payer: Medicare (Managed Care) | Primary: Family Medicine

## 2024-01-29 DIAGNOSIS — I10 Essential (primary) hypertension: Secondary | ICD-10-CM

## 2024-01-29 LAB — NM STRESS TEST WITH MYOCARDIAL PERFUSION
Angina Index: 0
Baseline Diastolic BP: 60 mmHg
Baseline HR: 83 {beats}/min
Baseline O2 Sat: 96 %
Baseline Systolic BP: 116 mmHg
Body Surface Area: 2.31 m2
Exercise Duration Seconds: 31 s
Exercise Duration Time: 5 min
Nuc Stress EF: 69 %
Stress Diastolic BP: 72 mmHg
Stress Estimated Workload: 5.8 METS
Stress O2 Sat: 97 %
Stress Peak HR: 133 {beats}/min
Stress Percent HR Achieved: 92 %
Stress Rate Pressure Product: 23142 BPM*mmHg
Stress Systolic BP: 174 mmHg
Stress Target HR: 144 {beats}/min
TID: 1

## 2024-01-29 MED ORDER — TECHNETIUM TC 99M SESTAMIBI IV KIT
Freq: Once | INTRAVENOUS | Status: AC | PRN
Start: 2024-01-29 — End: 2024-01-29
  Administered 2024-01-29: 13:00:00 25.9 via INTRAVENOUS

## 2024-01-30 NOTE — Progress Notes (Signed)
"  Sent to scanning     Last 5 days per log:    01/29/24- 159/76 95  01/28/24- 108/65 76, 150/75 75  01/27/24- 108/66 76, 126/70 72  01/26/24- 107/70 80, 147/72 71  01/25/24- 102/61 85, 144/76 75  "

## 2024-01-30 NOTE — Result Encounter Note (Signed)
"  Good news, your nuclear stress test was normal.    Please call with office with further questions.    Dr. Elenor  "

## 2024-02-02 ENCOUNTER — Encounter

## 2024-02-05 MED ORDER — TAMSULOSIN HCL 0.4 MG PO CAPS
0.4 | ORAL_CAPSULE | Freq: Every day | ORAL | 1 refills | 30.00000 days | Status: AC
Start: 2024-02-05 — End: ?

## 2024-02-22 MED ORDER — VALSARTAN 80 MG PO TABS
80 | ORAL_TABLET | Freq: Every day | ORAL | 3 refills | Status: AC
Start: 2024-02-22 — End: 2025-02-21

## 2024-02-23 ENCOUNTER — Encounter

## 2024-02-23 MED ORDER — SERTRALINE HCL 50 MG PO TABS
50 | ORAL_TABLET | Freq: Every day | ORAL | 1 refills | 90.00000 days | Status: DC
Start: 2024-02-23 — End: 2024-04-24

## 2024-02-27 ENCOUNTER — Ambulatory Visit
Admit: 2024-02-27 | Discharge: 2024-02-27 | Payer: Medicare (Managed Care) | Attending: Family Medicine | Primary: Family Medicine

## 2024-02-27 VITALS — BP 121/63 | HR 78 | Temp 98.70000°F | Resp 15 | Ht 71.0 in | Wt 238.1 lb

## 2024-02-27 DIAGNOSIS — F331 Major depressive disorder, recurrent, moderate: Principal | ICD-10-CM

## 2024-02-27 NOTE — Patient Instructions (Addendum)
"  Today we discussed:  Ordering labs to check your A1C and b12  Keep on sertraline  50 mg    Thank you and great to see you today!   Please reach out on MyChart with any questions.   [name], APRN - CNP   "

## 2024-02-27 NOTE — Assessment & Plan Note (Addendum)
"   Unclear control, due for recheck, has been taking metformin  as directed    Orders:    Basic Metabolic Panel; Future    Hepatic Function Panel; Future    Hemoglobin A1C; Future    Albumin/Creatinine Ratio, Urine; Future    "

## 2024-02-27 NOTE — Progress Notes (Signed)
"  Chief Complaint   Patient presents with    Depression     Patrick Welch is a 78 y.o. male who presents for a follow up on major depressive disorder.      Have you been to the ER, urgent care clinic since your last visit?  Hospitalized since your last visit?    NO    Have you seen or consulted any other health care providers outside of [facility] since your last visit?    NO          Click Here for Release of Records Request    "

## 2024-02-27 NOTE — Progress Notes (Signed)
 "   Family Medicine Follow-Up Progress Note  [facility]    Patient Patrick Welch  [birth year], 78 y.o., male  Encounter Date 02/27/24    Assessment & Plan :         Assessment & Plan  Major depressive disorder, recurrent episode, moderate (HCC)  - Sertraline  50 mg effective in managing MDD, anxiety and reducing panic attacks.  - Discontinued Atarax.  - Continue sertraline  50 mg.  - Regular therapy sessions, next appointment next week.       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  - Taking donepezil , no side effects.  - Continue donepezil  to slow memory decline.  - Has had neuropsych eval           Follow-up and Dispositions    Return in about 12 weeks (around 05/21/2024) for AWV, Hypertension, Diabetes.              Return in about 12 weeks (around 05/21/2024) for AWV, Hypertension, Diabetes.    Diagnoses and plan discussed; questions answered; AVS provided.  Medication Side Effects and Warnings discussed, Labs reviewed and/or requested, Records reviewed and/or requested    Patient Instructions   Today we discussed:  Ordering labs to check your A1C and b12  Keep on sertraline  50 mg    Thank you and great to see you today!   Please reach out on MyChart with any questions.   [name], APRN - CNP     Subjective :     History of Present Illness  Patrick Welch is a 78 y.o. male who presents for evaluation of episodes of panic disorder, depression, cognitive impairment, and  dorsalgia.    Anxiety  - Currently managed with sertraline , titrated from 25 mg to 50 mg, facilitating the discontinuation of hydroxyzine  - Psychotherapy sessions with [facility] have been beneficial  - Most recent session occurred 2 weeks ago, next scheduled for the upcoming week  - Frequency of panic attacks has decreased but still occur intermittently  - Several months ago, experienced more frequent panic attacks, often triggered by benign situations such as shopping  - Couples counseling was recommended but has not been pursued    Cognitive Impairment  - Currently taking donepezil  with no reported adverse effects    Dorsalgia  - May be exacerbated by physical labor  - Attended three chiropractic sessions, which provided temporary relief from stiffness  - Condition has since deteriorated  - Follow-up with a spine and pain management specialist is required  - MRI was performed during the last visit    The patient expressed interest in part-time employment and applied for a position at [employer], which was subsequently filled. He previously worked at [employer] for 3-4 years in [place]  and found the experience enjoyable. His counselor has suggested regular exposure to varied environments. Efforts to socialize include dining out  and visiting a used bookstore.    The patient has not had a recent consultation with his allergist.    Marital Status: Married        Current Outpatient Medications   Medication Sig Dispense Refill    sertraline  (ZOLOFT ) 50 MG tablet Take 1 tablet by mouth daily Start after finishing two weeks of cymbalta  30 mg. 90 tablet 1    valsartan  (DIOVAN ) 80 MG tablet Take 1 tablet by mouth daily 90 tablet 3    tamsulosin  (FLOMAX ) 0.4 MG capsule Take 1 capsule by mouth daily 90 capsule 1    cyanocobalamin  ([facility] VITAMIN B12) 1000 MCG tablet Take 1 tablet by mouth daily 90 tablet 2    rosuvastatin  (CRESTOR ) 5 MG tablet Take 1 tablet by mouth daily 90 tablet 1    aspirin  81 MG EC  tablet Take 1 tablet by mouth daily 90 tablet 1    amLODIPine  (NORVASC ) 10 MG tablet Take 1 tablet by mouth daily 90 tablet 1    famotidine  (PEPCID ) 20 MG tablet Take 1 tablet by mouth 2 times daily 180 tablet 1    donepezil  (ARICEPT ) 5 MG tablet Take 1 tablet by mouth nightly 90 tablet 1    vitamin D (ERGOCALCIFEROL) 1.25 MG (50000 UT) CAPS capsule Take 1 capsule by mouth once a week      metFORMIN  (GLUCOPHAGE ) 500 MG tablet Take 1 tablet by mouth 2 times daily (with meals) 180 tablet 1    methocarbamol (ROBAXIN) 750 MG tablet Take 1 tablet by mouth as needed (pain)       No current facility-administered medications for this visit.       Review of Systems  Review of Systems - negative except as listed above in the HPI    Objective :     Vitals:    02/27/24 1444   BP: 121/63   BP Site: Left Upper Arm   Patient Position: Sitting   BP Cuff Size: Medium Adult   Pulse: 78   Resp: 15   Temp: 98.7 F (37.1 C)   TempSrc: Temporal   SpO2: 95%   Weight: 108 kg (238 lb 1.6 oz)   Height: 1.803 m (5' 11)            Vitals and Nurse Documentation reviewed.     Physical Exam  General Appearance: Normal.  Vital signs: Within normal limits.  HEENT: Within normal limits.  Respiratory: Within normal limits.  Cardiovascular: heart murmur, RRR  Skin: Warm and dry, no rash.  Neurological: Normal.  Other observations: Blood pressure normal.    Please note that this dictation was completed with Dragon, the computer voice recognition software.  Quite often unanticipated grammatical, syntax, homophones, and other interpretive errors are inadvertently transcribed by the computer software.  Please disregard these errors.  Please excuse any errors that have escaped final proofreading.  Thank you.   The patient (or guardian, if applicable) and other individuals in attendance with the patient were advised that Artificial Intelligence will be utilized during this visit to record and process the conversation to generate a clinical note. The  patient (or guardian, if applicable) and other individuals in attendance at the appointment consented to the use of AI, including the recording.      --------------------[name], APRN - CNP  "

## 2024-03-22 ENCOUNTER — Encounter

## 2024-03-22 MED ORDER — DONEPEZIL HCL 5 MG PO TABS
5 | ORAL_TABLET | Freq: Every evening | ORAL | 1 refills | 30.00000 days | Status: AC
Start: 2024-03-22 — End: ?

## 2024-03-22 NOTE — Telephone Encounter (Signed)
"  [facility] faxed request  [facility]    DONEPEZIL  HCL 5 MG TABLET    "

## 2024-04-09 ENCOUNTER — Encounter

## 2024-04-09 MED ORDER — METFORMIN HCL 500 MG PO TABS
500 | ORAL_TABLET | Freq: Two times a day (BID) | ORAL | 1 refills | 90.00000 days | Status: AC
Start: 2024-04-09 — End: ?

## 2024-04-09 NOTE — Telephone Encounter (Signed)
"  [facility] request  [facility]    METFORMIN  HCL 500 MG TABLET    "

## 2024-04-12 NOTE — Telephone Encounter (Signed)
 Pt's appointment has been changed back.

## 2024-04-12 NOTE — Telephone Encounter (Signed)
-----   Message from Your Healthcare Team sent at 04/12/2024 10:32 AM EST -----  Regarding: [facility] Appointment Request  [facility] Appointment Request    Patient needs appointment for Patrick Welch Appointment Type: Annual Visit.Patient requesting to reschedule his appointment on 06/18/2024 at 2pm under Dr. [place] for his AWV . He wanted to get back his original appointment.     Patient Requested Dates(s):05/31/2024  Patient Requested Time:10:20 am  Provider Name:Saxby, Powell, APRN - CNP    Reason for Appointment Request: Established Patient - No appointments available during search  --------------------------------------------------------------------------------------------------------------------------    Relationship to Patient: Self     Call Back Information: OK to leave message on voicemail  Preferred Call Back Number: Phone [phone]

## 2024-04-22 ENCOUNTER — Encounter

## 2024-04-22 MED ORDER — FAMOTIDINE 20 MG PO TABS
20 | ORAL_TABLET | Freq: Two times a day (BID) | ORAL | 1 refills | 30.00000 days | Status: AC
Start: 2024-04-22 — End: ?

## 2024-04-22 NOTE — Telephone Encounter (Signed)
 [id number] faxed refill request:    Famotidine  20 mg tablet, take 1 tablet by mouth twice a day.  Quantity: 180  Ldm

## 2024-04-24 ENCOUNTER — Ambulatory Visit
Admit: 2024-04-24 | Discharge: 2024-04-24 | Payer: Medicare (Managed Care) | Attending: Family Medicine | Primary: Family Medicine

## 2024-04-24 VITALS — BP 128/89 | HR 86 | Temp 97.10000°F | Resp 15 | Ht 71.0 in | Wt 237.0 lb

## 2024-04-24 DIAGNOSIS — F331 Major depressive disorder, recurrent, moderate: Principal | ICD-10-CM

## 2024-04-24 MED ORDER — SERTRALINE HCL 50 MG PO TABS
50 | ORAL_TABLET | Freq: Every day | ORAL | 1 refills | 90.00000 days | Status: AC
Start: 2024-04-24 — End: ?

## 2024-04-24 MED ORDER — AMLODIPINE BESYLATE 10 MG PO TABS
10 | ORAL_TABLET | Freq: Every day | ORAL | 3 refills | 90.00000 days | Status: AC
Start: 2024-04-24 — End: ?

## 2024-04-24 MED ORDER — ARIPIPRAZOLE 2 MG PO TABS
2 | ORAL_TABLET | Freq: Every day | ORAL | 0 refills | 30.00000 days | Status: AC
Start: 2024-04-24 — End: ?

## 2024-04-24 NOTE — Assessment & Plan Note (Addendum)
 Seeing nephrology, continued monitoring

## 2024-04-24 NOTE — Progress Notes (Signed)
 Chief Complaint   Patient presents with    Anxiety     Patrick Welch is a 79 y.o. male who presents for a follow up on anxiety.      Have you been to the ER, urgent care clinic since your last visit?  Hospitalized since your last visit?    NO    Have you seen or consulted any other health care providers outside of [facility] since your last visit?    NO          Click Here for Release of Records Request

## 2024-04-24 NOTE — Assessment & Plan Note (Addendum)
 Chronic, at goal (stable), continue current treatment plan, medication adherence emphasized, and lifestyle modifications recommended

## 2024-04-24 NOTE — Assessment & Plan Note (Addendum)
 Stable.  - Currently on amlodipine . Refill sent to pharmacy.  - Reviewed BP diary  Orders:    amLODIPine  (NORVASC ) 10 MG tablet; Take 1 tablet by mouth daily

## 2024-04-24 NOTE — Assessment & Plan Note (Addendum)
 Seeing Cardiology

## 2024-04-24 NOTE — Progress Notes (Addendum)
 Chief Complaint   Patient presents with    Anxiety     Patrick Welch is a 79 y.o. male who presents for a follow up on anxiety.        Assessment & Plan :      Assessment & Plan  Major depressive disorder, recurrent episode, moderate (HCC)   Acute.  - Symptoms include depression and anxiety, with panic attacks in the evenings.  - Neuropsychological testing indicates depression and anxiety as primary factors.  - Abilify  2 mg daily discussed as potential treatment. Continue Zoloft  in the morning.  - Prescription for Abilify  2 mg daily provided, to be taken in the morning initially. Adjust timing if grogginess occurs. 30-day supply prescribed to monitor response.  - May increase zoloft  at follow up, but prefer slow changes. Reviewed with patient who is in agreement. Discussed possible side effects. Continue therapy. Denies SI/HI.  Orders:    ARIPiprazole  (ABILIFY ) 2 MG tablet; Take 1 tablet by mouth daily    sertraline  (ZOLOFT ) 50 MG tablet; Take 1 tablet by mouth daily    Panic  - Reports panic attacks and generalized anxiety, particularly in the evenings.  - Abilify  2 mg daily discussed as potential treatment to stabilize mood. Continue Zoloft  in the morning.  - Prescription for Abilify  2 mg daily provided, to be taken in the morning initially. Adjust timing if grogginess occurs. 30-day supply prescribed to monitor response.     Essential hypertension   Stable.  - Currently on amlodipine . Refill sent to pharmacy.  - Reviewed BP diary  Orders:    amLODIPine  (NORVASC ) 10 MG tablet; Take 1 tablet by mouth daily    Other ventricular tachycardia (HCC)   Seeing Cardiology         Type 2 diabetes mellitus with hyperglycemia, without long-term current use of insulin (HCC)   Chronic, at goal (stable), continue current treatment plan, medication adherence emphasized, and lifestyle modifications recommended         Stage 3a chronic kidney disease (HCC)   Seeing nephrology, continued monitoring         Mild neurocognitive  disorder due to known physiological condition without behavioral disturbance   Seen by neuropsych x 2. Testing points instead to more of a mood driven cognitive inefficiency. Patient is on donepezil .          B12 deficiency  Lab orders placed, will recheck at follow up           Follow-up and Dispositions    Return keep appt next month for AWV and med follow up.          Return keep appt next month for AWV and med follow up.    There are no Patient Instructions on file for this visit.    Subjective :    History of Present Illness  The patient presents for evaluation of major depressive disorder, generalized anxiety disorder, and panic disorder.    Depression, Anxiety, and Panic Attacks  - He reports experiencing symptoms of depression, anxiety, and panic attacks, which he attributes to cognitive impairment, including forgetfulness and memory issues.  - These symptoms predominantly manifest in the afternoon and evening, with no identifiable precipitating factors.  - He describes new onset paranoia associated with sunset.  - The patient expresses uncertainty regarding the efficacy of his current therapy and is willing to initiate treatment with aripiprazole  (Abilify ).    Sleep Pattern  - His sleep pattern is generally adequate, although he awakens three times per  night due to nocturia.    Medication and Management  - He administers most of his medications in the morning following breakfast and continues to take donepezil  for cognitive management.  - He has not undergone recent laboratory testing.  - During panic attacks, he experiences dyspnea, which he manages with deep breathing exercises.  - He reports a lack of private space at home.    Family and Social History  - The patient has not seen his grandchildren for six years due to his wife's irritable bowel syndrome, which impedes travel.  - He maintains a good relationship with his daughter, with whom he communicates more frequently than his son, whom he has not  spoken to in a week.  - His daughter visited six months ago.  - He has noticed unintentional weight loss.    Current Medications  - Currently, he is prescribed amlodipine  and famotidine , with few doses remaining.  - [facility]                  Review of Systems   Negative except as listed above in the HPI    Objective :    Vitals:    04/24/24 1017 04/24/24 1041   BP: (!) 100/57 128/89   BP Site: Left Upper Arm    Patient Position: Sitting    BP Cuff Size: Medium Adult    Pulse: 67 86   Resp: 15    Temp: 97.1 F (36.2 C)    TempSrc: Temporal    SpO2: 96%    Weight: 107.5 kg (237 lb)    Height: 1.803 m (5' 11)         Physical Exam     General: Well developed, in no acute distress.  HEENT: No jaundice  Neck: Supple, no stiffness  Heart: Normal rate; no murmurs  Respiratory: Clear bilaterally x 4, no wheezing or rales  Extremities:  No edema, normal cap refill, no cyanosis.  Musculoskeletal: No clubbing, no deformities  Neuro: A&Ox3, speech clear, gait stable, cooperative, no focal neurologic deficits  Skin: Skin color is normal. No rashes or lesions. Non diaphoretic, moist.      Please note that this dictation was completed with Dragon, the computer voice recognition software.  Quite often unanticipated grammatical, syntax, homophones, and other interpretive errors are inadvertently transcribed by the computer software.  Please disregard these errors.  Please excuse any errors that have escaped final proofreading.  Thank you.   The patient (or guardian, if applicable) and other individuals in attendance with the patient were advised that Artificial Intelligence will be utilized during this visit to record and process the conversation to generate a clinical note. The patient (or guardian, if applicable) and other individuals in attendance at the appointment consented to the use of AI, including the recording.      --------------------[name], APRN - CNP

## 2024-04-26 ENCOUNTER — Encounter

## 2024-04-26 MED ORDER — ROSUVASTATIN CALCIUM 5 MG PO TABS
5 | ORAL_TABLET | Freq: Every day | ORAL | 3 refills | 90.00000 days | Status: AC
Start: 2024-04-26 — End: ?

## 2024-04-29 ENCOUNTER — Ambulatory Visit: Payer: Medicare (Managed Care) | Attending: Internal Medicine | Primary: Family Medicine

## 2024-05-07 ENCOUNTER — Encounter

## 2024-05-08 ENCOUNTER — Encounter

## 2024-05-08 MED ORDER — LIDOCAINE 5 % EX PTCH
5 | MEDICATED_PATCH | Freq: Every day | CUTANEOUS | 0 refills | Status: AC
Start: 2024-05-08 — End: 2024-05-18

## 2024-05-08 NOTE — Telephone Encounter (Signed)
 Coverage of your drug was denied  We denied coverage under Medicare Part D for the following drug(s) you or your prescribing provider asked  for: LIDOCAINE  Patch  Why was coverage for this drug denied?  We denied coverage for this drug because: The information provided by your prescriber did not meet the  requirements for covering this medication (prior authorization).  Your plan does not allow coverage of this medication based on your prescriber answering No to the  following question(s):  Is the requested drug being prescribed for pain associated with post-herpetic neuralgia (PHN)?  Is the requested drug being prescribed for pain associated with diabetic neuropathy?  Is the requested drug being prescribed for pain associated with cancer-related neuropathy (including  treatment-related neuropathy [e.g., neuropathy associated with radiation treatment or chemotherapy])?
